# Patient Record
Sex: Female | Born: 1965
Health system: Southern US, Community
[De-identification: ages and names within clinical notes are randomized; demographics above are authoritative.]

## PROBLEM LIST (undated history)

## (undated) DIAGNOSIS — E785 Hyperlipidemia, unspecified: Secondary | ICD-10-CM

## (undated) DIAGNOSIS — M1990 Unspecified osteoarthritis, unspecified site: Secondary | ICD-10-CM

## (undated) DIAGNOSIS — E119 Type 2 diabetes mellitus without complications: Secondary | ICD-10-CM

## (undated) DIAGNOSIS — I10 Essential (primary) hypertension: Secondary | ICD-10-CM

## (undated) DIAGNOSIS — G5600 Carpal tunnel syndrome, unspecified upper limb: Secondary | ICD-10-CM

## (undated) HISTORY — DX: Carpal tunnel syndrome, unspecified upper limb: G56.00

## (undated) HISTORY — DX: Unspecified osteoarthritis, unspecified site: M19.90

## (undated) HISTORY — PX: TUBAL LIGATION: SHX77

## (undated) HISTORY — DX: Type 2 diabetes mellitus without complications: E11.9

## (undated) HISTORY — DX: Hyperlipidemia, unspecified: E78.5

## (undated) HISTORY — PX: CHOLECYSTECTOMY: SHX55

## (undated) HISTORY — DX: Essential (primary) hypertension: I10

---

## 2001-02-11 ENCOUNTER — Emergency Department (HOSPITAL_COMMUNITY): Admission: EM | Admit: 2001-02-11 | Discharge: 2001-02-11 | Payer: Self-pay | Admitting: Emergency Medicine

## 2005-02-01 ENCOUNTER — Ambulatory Visit: Payer: Self-pay | Admitting: Family Medicine

## 2005-02-02 ENCOUNTER — Ambulatory Visit (HOSPITAL_COMMUNITY): Admission: RE | Admit: 2005-02-02 | Discharge: 2005-02-02 | Payer: Self-pay | Admitting: Family Medicine

## 2005-04-20 ENCOUNTER — Ambulatory Visit: Payer: Self-pay | Admitting: Family Medicine

## 2005-05-29 ENCOUNTER — Ambulatory Visit (HOSPITAL_COMMUNITY): Admission: RE | Admit: 2005-05-29 | Discharge: 2005-05-29 | Payer: Self-pay | Admitting: Family Medicine

## 2006-01-29 ENCOUNTER — Ambulatory Visit: Payer: Self-pay | Admitting: Family Medicine

## 2006-02-08 ENCOUNTER — Ambulatory Visit (HOSPITAL_COMMUNITY): Admission: RE | Admit: 2006-02-08 | Discharge: 2006-02-08 | Payer: Self-pay | Admitting: Family Medicine

## 2006-04-05 ENCOUNTER — Ambulatory Visit: Payer: Self-pay | Admitting: Family Medicine

## 2006-05-03 ENCOUNTER — Encounter (INDEPENDENT_AMBULATORY_CARE_PROVIDER_SITE_OTHER): Payer: Self-pay | Admitting: *Deleted

## 2006-05-03 ENCOUNTER — Other Ambulatory Visit: Admission: RE | Admit: 2006-05-03 | Discharge: 2006-05-03 | Payer: Self-pay | Admitting: Family Medicine

## 2006-05-03 ENCOUNTER — Ambulatory Visit: Payer: Self-pay | Admitting: Family Medicine

## 2006-07-27 ENCOUNTER — Ambulatory Visit: Payer: Self-pay | Admitting: Family Medicine

## 2006-11-29 ENCOUNTER — Ambulatory Visit: Payer: Self-pay | Admitting: Family Medicine

## 2006-12-07 ENCOUNTER — Ambulatory Visit (HOSPITAL_COMMUNITY): Admission: RE | Admit: 2006-12-07 | Discharge: 2006-12-07 | Payer: Self-pay | Admitting: Family Medicine

## 2007-02-04 ENCOUNTER — Ambulatory Visit: Payer: Self-pay | Admitting: Family Medicine

## 2007-02-04 LAB — CONVERTED CEMR LAB
BUN: 13 mg/dL (ref 6–23)
CO2: 24 meq/L (ref 19–32)
Glucose, Bld: 82 mg/dL (ref 70–99)
Potassium: 3.5 meq/L (ref 3.5–5.3)
Sodium: 140 meq/L (ref 135–145)

## 2007-02-05 ENCOUNTER — Encounter: Payer: Self-pay | Admitting: Family Medicine

## 2007-05-02 ENCOUNTER — Ambulatory Visit: Payer: Self-pay | Admitting: Family Medicine

## 2007-05-06 ENCOUNTER — Encounter (INDEPENDENT_AMBULATORY_CARE_PROVIDER_SITE_OTHER): Payer: Self-pay | Admitting: *Deleted

## 2007-05-06 ENCOUNTER — Encounter: Payer: Self-pay | Admitting: Family Medicine

## 2007-05-06 ENCOUNTER — Ambulatory Visit: Payer: Self-pay | Admitting: Family Medicine

## 2007-05-06 ENCOUNTER — Other Ambulatory Visit: Admission: RE | Admit: 2007-05-06 | Discharge: 2007-05-06 | Payer: Self-pay | Admitting: Family Medicine

## 2007-05-06 LAB — CONVERTED CEMR LAB
Basophils Absolute: 0 10*3/uL (ref 0.0–0.1)
CO2: 26 meq/L (ref 19–32)
Calcium: 9.4 mg/dL (ref 8.4–10.5)
Creatinine, Ser: 1.18 mg/dL (ref 0.40–1.20)
Glucose, Bld: 87 mg/dL (ref 70–99)
LDL Cholesterol: 111 mg/dL — ABNORMAL HIGH (ref 0–99)
Lymphs Abs: 3.9 10*3/uL — ABNORMAL HIGH (ref 0.7–3.3)
Monocytes Relative: 6 % (ref 3–11)
Neutro Abs: 5.2 10*3/uL (ref 1.7–7.7)
Neutrophils Relative %: 53 % (ref 43–77)
RDW: 13.8 % (ref 11.5–14.0)
TSH: 1.87 microintl units/mL (ref 0.350–5.50)
Total CHOL/HDL Ratio: 4.5
WBC: 9.9 10*3/uL (ref 4.0–10.5)

## 2007-05-07 ENCOUNTER — Encounter: Payer: Self-pay | Admitting: Family Medicine

## 2007-05-07 LAB — CONVERTED CEMR LAB
Bilirubin Urine: NEGATIVE
Nitrite: NEGATIVE
Protein, ur: NEGATIVE mg/dL
Specific Gravity, Urine: 1.017 (ref 1.005–1.03)
Urobilinogen, UA: 0.2 (ref 0.0–1.0)

## 2007-09-10 ENCOUNTER — Encounter (INDEPENDENT_AMBULATORY_CARE_PROVIDER_SITE_OTHER): Payer: Self-pay | Admitting: *Deleted

## 2007-09-10 ENCOUNTER — Emergency Department (HOSPITAL_COMMUNITY): Admission: EM | Admit: 2007-09-10 | Discharge: 2007-09-10 | Payer: Self-pay | Admitting: Emergency Medicine

## 2007-10-08 ENCOUNTER — Ambulatory Visit: Payer: Self-pay | Admitting: Family Medicine

## 2008-02-04 DIAGNOSIS — Z789 Other specified health status: Secondary | ICD-10-CM

## 2008-02-04 DIAGNOSIS — E669 Obesity, unspecified: Secondary | ICD-10-CM | POA: Insufficient documentation

## 2008-02-07 ENCOUNTER — Ambulatory Visit: Payer: Self-pay | Admitting: Family Medicine

## 2008-03-20 ENCOUNTER — Ambulatory Visit: Payer: Self-pay | Admitting: Family Medicine

## 2008-05-28 ENCOUNTER — Ambulatory Visit (HOSPITAL_COMMUNITY): Admission: RE | Admit: 2008-05-28 | Discharge: 2008-05-28 | Payer: Self-pay | Admitting: Family Medicine

## 2008-07-14 ENCOUNTER — Encounter: Payer: Self-pay | Admitting: Family Medicine

## 2008-07-14 ENCOUNTER — Other Ambulatory Visit: Admission: RE | Admit: 2008-07-14 | Discharge: 2008-07-14 | Payer: Self-pay | Admitting: Family Medicine

## 2008-07-14 ENCOUNTER — Ambulatory Visit: Payer: Self-pay | Admitting: Family Medicine

## 2008-07-15 ENCOUNTER — Encounter: Payer: Self-pay | Admitting: Family Medicine

## 2008-07-16 ENCOUNTER — Encounter: Payer: Self-pay | Admitting: Family Medicine

## 2008-07-16 LAB — CONVERTED CEMR LAB
AST: 13 units/L (ref 0–37)
BUN: 17 mg/dL (ref 6–23)
Bilirubin, Direct: 0.2 mg/dL (ref 0.0–0.3)
CO2: 24 meq/L (ref 19–32)
Chloride: 102 meq/L (ref 96–112)
Glucose, Bld: 89 mg/dL (ref 70–99)
HDL: 42 mg/dL (ref >39)
Potassium: 3.9 meq/L (ref 3.5–5.3)
Sodium: 140 meq/L (ref 135–145)
Total Bilirubin: 0.6 mg/dL (ref 0.3–1.2)
Total CHOL/HDL Ratio: 4.6
Total Protein: 7.7 g/dL (ref 6.0–8.3)
VLDL: 44 mg/dL — ABNORMAL HIGH (ref 0–40)

## 2008-10-05 ENCOUNTER — Ambulatory Visit: Payer: Self-pay | Admitting: Family Medicine

## 2008-10-05 DIAGNOSIS — R51 Headache: Secondary | ICD-10-CM | POA: Insufficient documentation

## 2008-10-05 DIAGNOSIS — R519 Headache, unspecified: Secondary | ICD-10-CM | POA: Insufficient documentation

## 2008-10-05 LAB — CONVERTED CEMR LAB
Basophils Absolute: 0 10*3/uL (ref 0.0–0.1)
Eosinophils Absolute: 0.1 10*3/uL (ref 0.0–0.7)
HCT: 38.4 % (ref 36.0–46.0)
Hemoglobin: 12.7 g/dL (ref 12.0–15.0)
MCV: 86.8 fL (ref 78.0–100.0)
Monocytes Absolute: 0.9 10*3/uL (ref 0.1–1.0)
Neutro Abs: 7.4 10*3/uL (ref 1.7–7.7)
RBC: 4.43 M/uL (ref 3.87–5.11)
RDW: 13.8 % (ref 11.5–15.5)

## 2009-03-17 ENCOUNTER — Ambulatory Visit: Payer: Self-pay | Admitting: Family Medicine

## 2009-03-17 DIAGNOSIS — M25559 Pain in unspecified hip: Secondary | ICD-10-CM

## 2009-03-22 ENCOUNTER — Ambulatory Visit (HOSPITAL_COMMUNITY): Admission: RE | Admit: 2009-03-22 | Discharge: 2009-03-22 | Payer: Self-pay | Admitting: Family Medicine

## 2009-03-22 ENCOUNTER — Encounter: Payer: Self-pay | Admitting: Orthopedic Surgery

## 2009-03-23 ENCOUNTER — Encounter: Payer: Self-pay | Admitting: Family Medicine

## 2009-03-29 DIAGNOSIS — M21959 Unspecified acquired deformity of unspecified thigh: Secondary | ICD-10-CM | POA: Insufficient documentation

## 2009-04-14 ENCOUNTER — Encounter: Payer: Self-pay | Admitting: Orthopedic Surgery

## 2009-04-14 ENCOUNTER — Ambulatory Visit (HOSPITAL_COMMUNITY): Admission: RE | Admit: 2009-04-14 | Discharge: 2009-04-14 | Payer: Self-pay | Admitting: Family Medicine

## 2009-04-15 ENCOUNTER — Encounter: Payer: Self-pay | Admitting: Orthopedic Surgery

## 2009-04-19 ENCOUNTER — Ambulatory Visit: Payer: Self-pay | Admitting: Orthopedic Surgery

## 2009-04-19 DIAGNOSIS — M545 Low back pain: Secondary | ICD-10-CM | POA: Insufficient documentation

## 2009-04-21 ENCOUNTER — Encounter (HOSPITAL_COMMUNITY): Admission: RE | Admit: 2009-04-21 | Discharge: 2009-05-21 | Payer: Self-pay | Admitting: Orthopedic Surgery

## 2009-04-21 ENCOUNTER — Encounter: Payer: Self-pay | Admitting: Orthopedic Surgery

## 2009-04-23 ENCOUNTER — Telehealth: Payer: Self-pay | Admitting: Orthopedic Surgery

## 2009-04-23 ENCOUNTER — Telehealth: Payer: Self-pay | Admitting: Family Medicine

## 2009-04-23 ENCOUNTER — Ambulatory Visit: Payer: Self-pay | Admitting: Family Medicine

## 2009-04-28 ENCOUNTER — Ambulatory Visit (HOSPITAL_COMMUNITY): Admission: RE | Admit: 2009-04-28 | Discharge: 2009-04-28 | Payer: Self-pay | Admitting: Family Medicine

## 2009-05-07 ENCOUNTER — Ambulatory Visit: Payer: Self-pay | Admitting: Family Medicine

## 2009-05-14 ENCOUNTER — Encounter: Payer: Self-pay | Admitting: Family Medicine

## 2009-05-24 ENCOUNTER — Telehealth: Payer: Self-pay | Admitting: Family Medicine

## 2009-05-25 ENCOUNTER — Encounter (INDEPENDENT_AMBULATORY_CARE_PROVIDER_SITE_OTHER): Payer: Self-pay

## 2009-05-25 ENCOUNTER — Ambulatory Visit: Payer: Self-pay | Admitting: Family Medicine

## 2009-06-01 ENCOUNTER — Encounter: Admission: RE | Admit: 2009-06-01 | Discharge: 2009-06-01 | Payer: Self-pay | Admitting: Family Medicine

## 2009-06-01 ENCOUNTER — Encounter: Payer: Self-pay | Admitting: Family Medicine

## 2009-07-01 ENCOUNTER — Telehealth: Payer: Self-pay | Admitting: Family Medicine

## 2009-07-06 ENCOUNTER — Ambulatory Visit: Payer: Self-pay | Admitting: Family Medicine

## 2009-07-06 DIAGNOSIS — R5383 Other fatigue: Secondary | ICD-10-CM

## 2009-07-06 DIAGNOSIS — R5381 Other malaise: Secondary | ICD-10-CM | POA: Insufficient documentation

## 2009-07-07 ENCOUNTER — Encounter: Payer: Self-pay | Admitting: Family Medicine

## 2009-07-07 LAB — CONVERTED CEMR LAB
BUN: 23 mg/dL (ref 6–23)
CO2: 25 meq/L (ref 19–32)
Calcium: 9.9 mg/dL (ref 8.4–10.5)
Cholesterol: 223 mg/dL — ABNORMAL HIGH (ref 0–200)
Eosinophils Absolute: 0.1 10*3/uL (ref 0.0–0.7)
Eosinophils Relative: 1 % (ref 0–5)
Glucose, Bld: 94 mg/dL (ref 70–99)
LDL Cholesterol: 139 mg/dL — ABNORMAL HIGH (ref 0–99)
MCHC: 33.3 g/dL (ref 30.0–36.0)
MCV: 87.8 fL (ref 78.0–100.0)
Monocytes Absolute: 0.9 10*3/uL (ref 0.1–1.0)
Monocytes Relative: 8 % (ref 3–12)
Neutrophils Relative %: 52 % (ref 43–77)
RDW: 14.2 % (ref 11.5–15.5)
Triglycerides: 236 mg/dL — ABNORMAL HIGH (ref <150)
WBC: 10.6 10*3/uL — ABNORMAL HIGH (ref 4.0–10.5)

## 2009-07-08 ENCOUNTER — Encounter: Payer: Self-pay | Admitting: Family Medicine

## 2009-07-16 ENCOUNTER — Encounter: Payer: Self-pay | Admitting: Family Medicine

## 2009-07-16 ENCOUNTER — Encounter: Payer: Self-pay | Admitting: Orthopedic Surgery

## 2009-07-21 ENCOUNTER — Encounter: Payer: Self-pay | Admitting: Orthopedic Surgery

## 2009-09-06 ENCOUNTER — Telehealth: Payer: Self-pay | Admitting: Family Medicine

## 2009-09-22 ENCOUNTER — Ambulatory Visit: Payer: Self-pay | Admitting: Family Medicine

## 2009-09-22 ENCOUNTER — Other Ambulatory Visit: Admission: RE | Admit: 2009-09-22 | Discharge: 2009-09-22 | Payer: Self-pay | Admitting: Family Medicine

## 2009-09-30 ENCOUNTER — Encounter: Payer: Self-pay | Admitting: Family Medicine

## 2009-10-13 ENCOUNTER — Encounter: Payer: Self-pay | Admitting: Orthopedic Surgery

## 2009-11-12 ENCOUNTER — Encounter: Payer: Self-pay | Admitting: Family Medicine

## 2009-11-15 ENCOUNTER — Ambulatory Visit: Payer: Self-pay | Admitting: Family Medicine

## 2009-11-16 LAB — CONVERTED CEMR LAB
Chloride: 102 meq/L (ref 96–112)
Sodium: 139 meq/L (ref 135–145)

## 2009-11-17 ENCOUNTER — Encounter: Payer: Self-pay | Admitting: Family Medicine

## 2009-11-18 ENCOUNTER — Ambulatory Visit: Payer: Self-pay | Admitting: Family Medicine

## 2009-12-21 ENCOUNTER — Telehealth: Payer: Self-pay | Admitting: Family Medicine

## 2010-01-28 ENCOUNTER — Encounter: Payer: Self-pay | Admitting: Family Medicine

## 2010-02-17 IMAGING — CR DG HIP COMPLETE 2+V*R*
3 series · 3 of 3 positions shown · non-contrast
Comparison: 03/22/2009

CLINICAL DATA: Nonspecified acquired deformity of the right hip.

RIGHT HIP - COMPLETE 2+ VIEW

[view not recorded (1 of 3)]
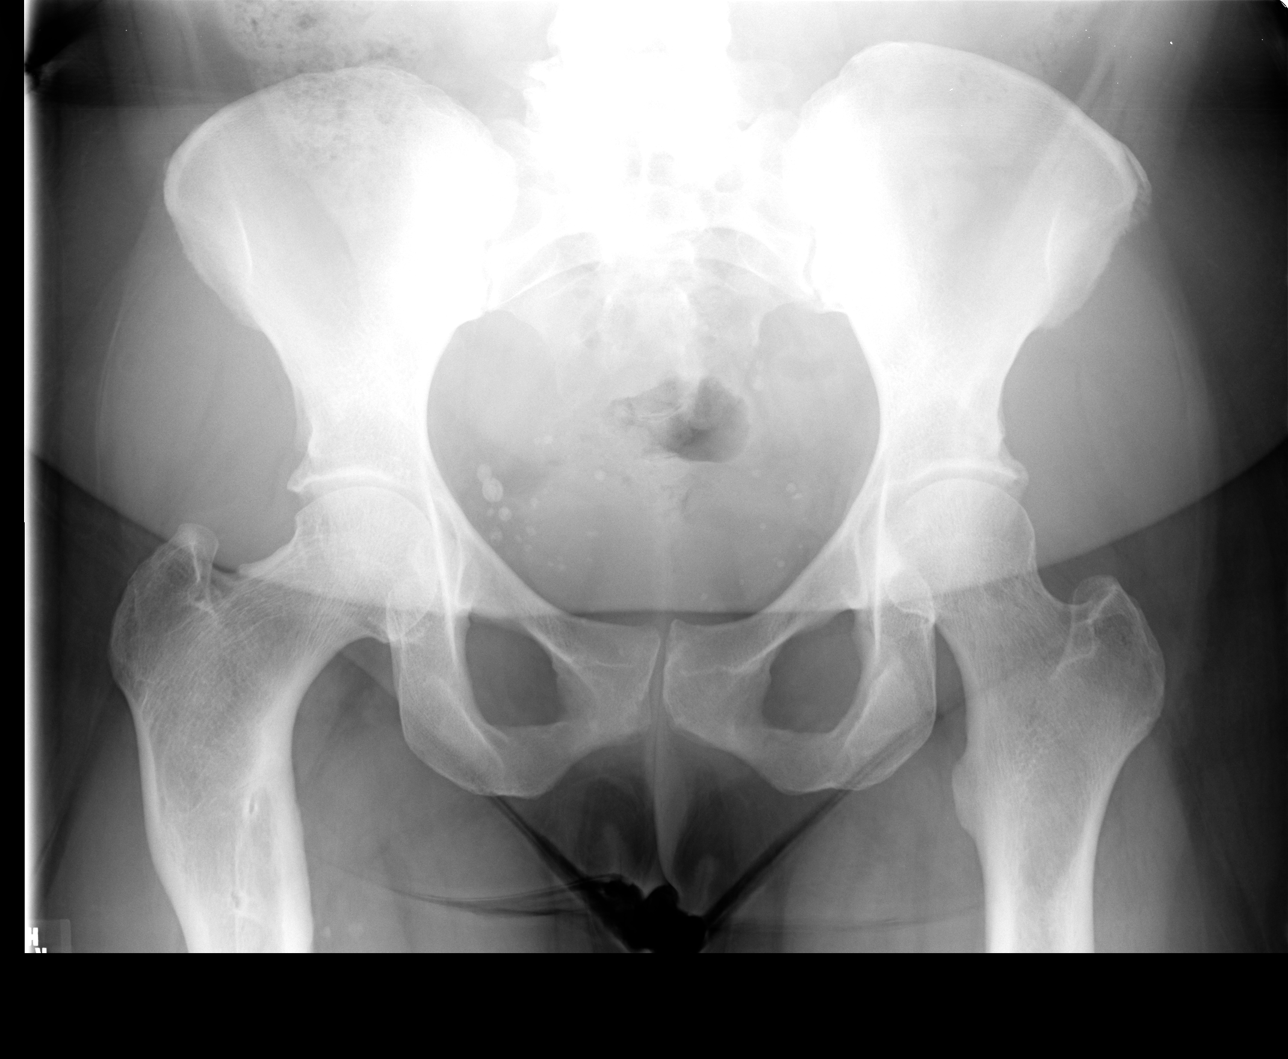

[view not recorded (2 of 3)]
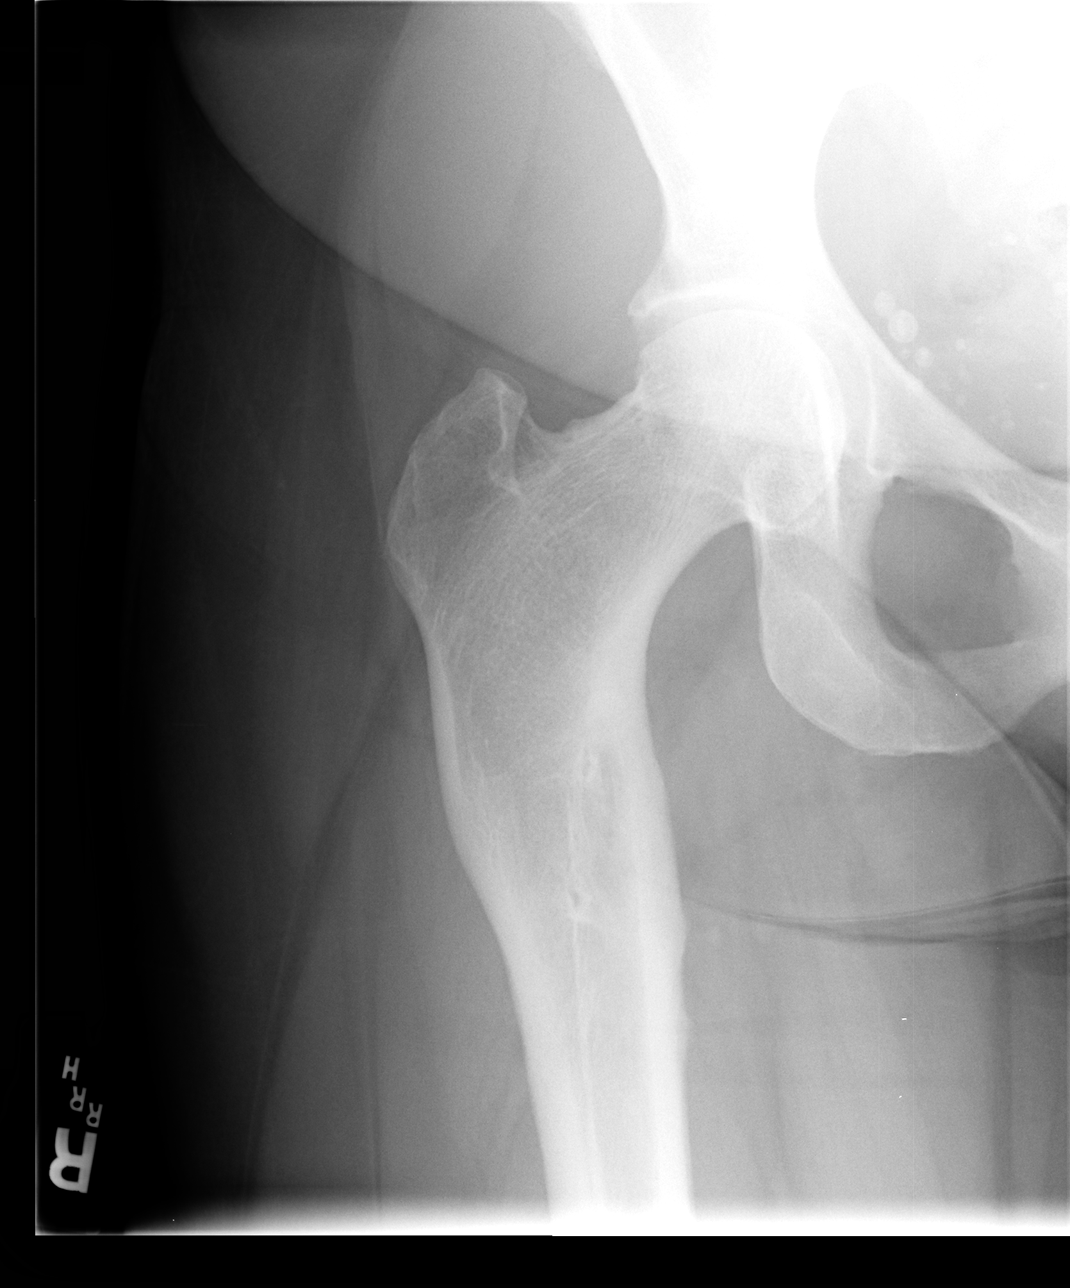

[view not recorded (3 of 3)]
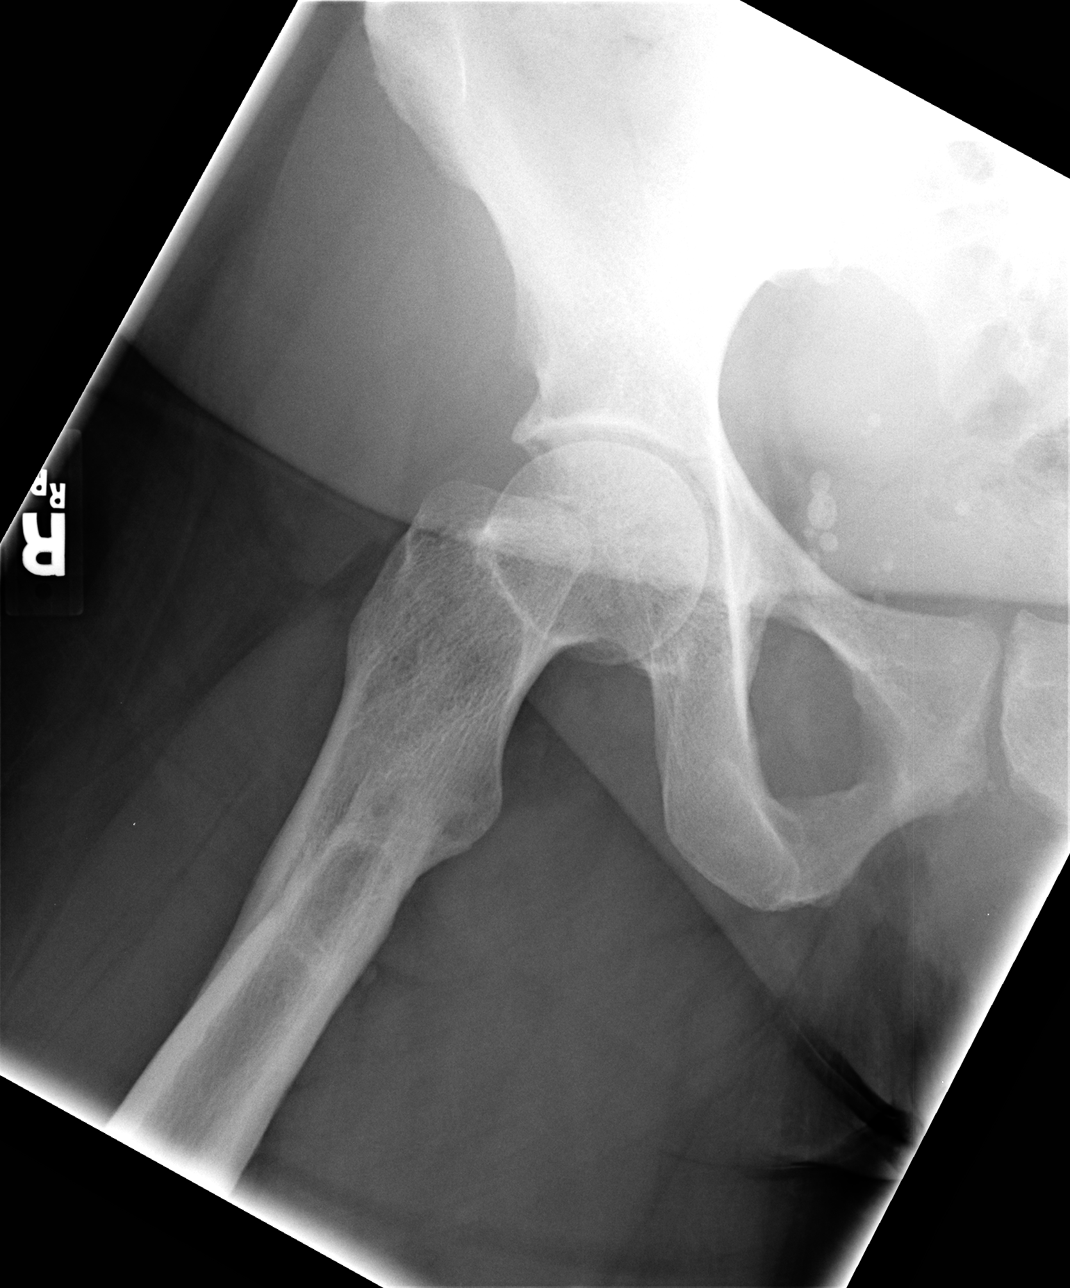

[3 of 3 positions shown; findings below may reference images not displayed]

FINDINGS: Nonspecific deformity of the proximal right femur
primarily the proximal diaphysis with some cortical thickening and
distortion of the trabecular pattern noted. Accentuation of stress
trabeculae of the right femoral neck and intertrochanteric region.
Findings are likely due to remote trauma. Joint spaces are
preserved.  SI joints unremarkable.
IMPRESSION: Deformity of the proximal right femur is most likely due to remote
trauma i.e. a healed fracture.

## 2010-03-15 ENCOUNTER — Ambulatory Visit: Payer: Self-pay | Admitting: Family Medicine

## 2010-03-17 ENCOUNTER — Telehealth: Payer: Self-pay | Admitting: Family Medicine

## 2010-04-07 ENCOUNTER — Telehealth: Payer: Self-pay | Admitting: Family Medicine

## 2010-04-12 ENCOUNTER — Telehealth: Payer: Self-pay | Admitting: Physician Assistant

## 2010-05-02 ENCOUNTER — Encounter: Payer: Self-pay | Admitting: Family Medicine

## 2010-05-04 ENCOUNTER — Encounter
Admission: RE | Admit: 2010-05-04 | Discharge: 2010-05-09 | Payer: Self-pay | Admitting: Physical Medicine & Rehabilitation

## 2010-05-09 ENCOUNTER — Ambulatory Visit: Payer: Self-pay | Admitting: Physical Medicine & Rehabilitation

## 2010-06-14 ENCOUNTER — Telehealth: Payer: Self-pay | Admitting: Family Medicine

## 2010-07-19 ENCOUNTER — Ambulatory Visit: Payer: Self-pay | Admitting: Family Medicine

## 2010-07-19 ENCOUNTER — Telehealth: Payer: Self-pay | Admitting: Family Medicine

## 2010-07-21 LAB — CONVERTED CEMR LAB
BUN: 17 mg/dL (ref 6–23)
CO2: 25 meq/L (ref 19–32)
Chloride: 100 meq/L (ref 96–112)
Creatinine, Ser: 1.4 mg/dL — ABNORMAL HIGH (ref 0.40–1.20)
Glucose, Bld: 80 mg/dL (ref 70–99)
HCT: 34.7 % — ABNORMAL LOW (ref 36.0–46.0)
Lymphs Abs: 5.1 10*3/uL — ABNORMAL HIGH (ref 0.7–4.0)
MCV: 86.1 fL (ref 78.0–100.0)
Monocytes Relative: 5 % (ref 3–12)
Neutro Abs: 6.4 10*3/uL (ref 1.7–7.7)
Neutrophils Relative %: 52 % (ref 43–77)
Platelets: 438 10*3/uL — ABNORMAL HIGH (ref 150–400)
Potassium: 3.7 meq/L (ref 3.5–5.3)
RBC: 4.03 M/uL (ref 3.87–5.11)
Sodium: 138 meq/L (ref 135–145)
TSH: 2.517 microintl units/mL (ref 0.350–4.500)
Triglycerides: 196 mg/dL — ABNORMAL HIGH (ref <150)
VLDL: 39 mg/dL (ref 0–40)
Vit D, 25-Hydroxy: 26 ng/mL — ABNORMAL LOW (ref 30–89)

## 2010-07-28 ENCOUNTER — Telehealth: Payer: Self-pay | Admitting: Family Medicine

## 2010-07-28 ENCOUNTER — Ambulatory Visit: Payer: Self-pay | Admitting: Family Medicine

## 2010-07-29 ENCOUNTER — Encounter: Payer: Self-pay | Admitting: Family Medicine

## 2010-08-02 ENCOUNTER — Ambulatory Visit (HOSPITAL_COMMUNITY): Admission: RE | Admit: 2010-08-02 | Discharge: 2010-08-02 | Payer: Self-pay | Admitting: Family Medicine

## 2010-08-11 ENCOUNTER — Encounter: Payer: Self-pay | Admitting: Family Medicine

## 2010-08-17 ENCOUNTER — Telehealth: Payer: Self-pay | Admitting: Family Medicine

## 2010-08-17 ENCOUNTER — Ambulatory Visit (HOSPITAL_COMMUNITY): Admission: RE | Admit: 2010-08-17 | Discharge: 2010-08-17 | Payer: Self-pay | Admitting: Family Medicine

## 2010-08-24 ENCOUNTER — Encounter: Payer: Self-pay | Admitting: Family Medicine

## 2010-08-24 ENCOUNTER — Ambulatory Visit (HOSPITAL_COMMUNITY): Admission: RE | Admit: 2010-08-24 | Discharge: 2010-08-24 | Payer: Self-pay | Admitting: Family Medicine

## 2010-10-22 ENCOUNTER — Encounter: Payer: Self-pay | Admitting: Family Medicine

## 2010-10-27 ENCOUNTER — Other Ambulatory Visit: Payer: Self-pay | Admitting: Family Medicine

## 2010-10-27 ENCOUNTER — Other Ambulatory Visit (HOSPITAL_COMMUNITY)
Admission: RE | Admit: 2010-10-27 | Discharge: 2010-10-27 | Disposition: A | Discharge status: Home / Self Care | Payer: PRIVATE HEALTH INSURANCE | Source: Ambulatory Visit | Attending: Family Medicine | Admitting: Family Medicine

## 2010-10-27 ENCOUNTER — Ambulatory Visit
Admission: RE | Admit: 2010-10-27 | Discharge: 2010-10-27 | Payer: Self-pay | Source: Home / Self Care | Attending: Family Medicine | Admitting: Family Medicine

## 2010-10-27 DIAGNOSIS — Z01419 Encounter for gynecological examination (general) (routine) without abnormal findings: Secondary | ICD-10-CM | POA: Insufficient documentation

## 2010-10-27 DIAGNOSIS — R1900 Intra-abdominal and pelvic swelling, mass and lump, unspecified site: Secondary | ICD-10-CM | POA: Insufficient documentation

## 2010-10-27 LAB — CONVERTED CEMR LAB: OCCULT 1: NEGATIVE

## 2010-10-28 ENCOUNTER — Telehealth (INDEPENDENT_AMBULATORY_CARE_PROVIDER_SITE_OTHER): Payer: Self-pay | Admitting: *Deleted

## 2010-10-28 ENCOUNTER — Other Ambulatory Visit: Payer: Self-pay | Admitting: Family Medicine

## 2010-10-28 DIAGNOSIS — R1900 Intra-abdominal and pelvic swelling, mass and lump, unspecified site: Secondary | ICD-10-CM

## 2010-10-30 LAB — CONVERTED CEMR LAB
BUN: 16 mg/dL (ref 6–23)
Basophils Relative: 1 % (ref 0–1)
CO2: 23 meq/L (ref 19–32)
Calcium: 9.6 mg/dL (ref 8.4–10.5)
Chloride: 101 meq/L (ref 96–112)
Creatinine, Ser: 1.57 mg/dL — ABNORMAL HIGH (ref 0.40–1.20)
Glucose, Bld: 87 mg/dL (ref 70–99)
Hemoglobin: 10.3 g/dL — ABNORMAL LOW (ref 12.0–15.0)
Lymphocytes Relative: 31 % (ref 12–46)
Lymphs Abs: 3.9 10*3/uL (ref 0.7–4.0)
MCHC: 32.8 g/dL (ref 30.0–36.0)
MCV: 88.5 fL (ref 78.0–100.0)
Monocytes Absolute: 0.7 10*3/uL (ref 0.1–1.0)
Monocytes Relative: 6 % (ref 3–12)
Platelets: 431 10*3/uL — ABNORMAL HIGH (ref 150–400)

## 2010-11-01 ENCOUNTER — Encounter: Payer: Self-pay | Admitting: Family Medicine

## 2010-11-01 NOTE — Letter (Signed)
Summary: medical release  medical release   Imported By: Lind Guest 11/12/2009 13:53:18  _____________________________________________________________________  External Attachment:    Type:   Image     Comment:   External Document

## 2010-11-01 NOTE — Letter (Signed)
Summary: Medical record request Parameds  Medical record request Parameds   Imported By: Cammie Sickle 01/22/2010 12:18:37  _____________________________________________________________________  External Attachment:    Type:   Image     Comment:   External Document

## 2010-11-01 NOTE — Progress Notes (Signed)
  Phone Note From Pharmacy   Caller: [address] [place]. [phone]* Summary of Call: requesting refill on hydrocodone 7.5/750 Initial call taken by: Adella Hare LPN,  April 08, 9603 9:42 AM  Follow-up for Phone Call        ok to refill x 3 Follow-up by: Syliva Overman MD,  April 07, 2010 12:07 PM  Additional Follow-up for Phone Call Additional follow up Details #1::        Prescription resent Additional Follow-up by: Adella Hare LPN,  April 07, 5408 4:49 PM    Prescriptions: VICODIN ES 7.5-750 MG TABS (HYDROCODONE-ACETAMINOPHEN) Take 1 tab by mouth at bedtime  #30 x 2   Entered by:   Adella Hare LPN   Authorized by:   Syliva Overman MD   Signed by:   Adella Hare LPN on 81/19/1478   Method used:   Printed then faxed to ...       [address] [phone]* (retail)       [address]       [place], [place]  [address]       Ph: [phone] or [phone]       Fax: [phone]   RxID:   [id number]

## 2010-11-01 NOTE — Progress Notes (Signed)
Summary: speak with nurse  Phone Note Call from Patient   Reason for Call: Refill Medication Summary of Call: pt needs her refill on diet med. called into [facility] [phone] Initial call taken by: Rudene Anda,  June 14, 2010 4:01 PM  Follow-up for Phone Call        needs appt Follow-up by: Adella Hare LPN,  June 14, 2010 4:37 PM  Additional Follow-up for Phone Call Additional follow up Details #1::        pt states she has appt on 07/19/2010 to see dr.simpson. Additional Follow-up by: Rudene Anda,  June 15, 2010 8:47 AM    Additional Follow-up for Phone Call Additional follow up Details #2::    patient aware no refill until ov Follow-up by: Adella Hare LPN,  June 15, 2010 8:53 AM

## 2010-11-01 NOTE — Letter (Signed)
Summary: ING PAPERS  ING PAPERS   Imported By: Lind Guest 07/29/2010 09:52:56  _____________________________________________________________________  External Attachment:    Type:   Image     Comment:   External Document

## 2010-11-01 NOTE — Progress Notes (Signed)
Summary: order  Phone Note Call from Patient   Summary of Call: needs a order for a breast biopsy left breast at [facility] Initial call taken by: Lind Guest,  August 17, 2010 12:59 PM  Follow-up for Phone Call        patient already has appt Follow-up by: Adella Hare LPN,  August 17, 2010 1:34 PM

## 2010-11-01 NOTE — Progress Notes (Signed)
Summary: Z PAK  Phone Note Call from Patient   Summary of Call: [facility] DID NOT RECIEVE HER  Z PAK SHE WANTS TO KNOW CAN YOU SEND IT OVER Initial call taken by: Lind Guest,  March 17, 2010 1:58 PM  Follow-up for Phone Call        advised was filled at pharmacy Follow-up by: Adella Hare LPN,  March 17, 2010 3:15 PM

## 2010-11-01 NOTE — Letter (Signed)
Summary: BREAST CENTER  BREAST CENTER   Imported By: Lind Guest 08/11/2010 09:08:10  _____________________________________________________________________  External Attachment:    Type:   Image     Comment:   External Document

## 2010-11-01 NOTE — Assessment & Plan Note (Signed)
Summary: office visit   Vital Signs:  Patient profile:   45 year old female Menstrual status:  regular Height:      63 inches Weight:      203 pounds BMI:     36.09 O2 Sat:      98 % Pulse rate:   86 / minute Pulse rhythm:   regular Resp:     16 per minute BP sitting:   100 / 72 Cuff size:   large  Vitals Entered By: Everitt Amber (November 15, 2009 10:23 AM)  Nutrition Counseling: Patient's BMI is greater than 25 and therefore counseled on weight management options. CC: throat sore, hoarsness, tension in head, clearish drainage in nose, been going on since thursday   CC:  throat sore, hoarsness, tension in head, clearish drainage in nose, and been going on since thursday.  History of Present Illness: Reports  that she had been doing fairly well up until 1 week ago  Denies chest congestion, or cough productive of sputum. Denies chest pain, palpitations, PND, orthopnea or leg swelling. Denies abdominal pain, nausea, vomitting, diarrhea or constipation. Denies change in bowel movements or bloody stool. Denies dysuria , frequency, incontinence or hesitancy.  Denies headaches, vertigo, seizures. Denies depression, anxiety or insomnia. Denies  rash, lesions, or itch. she reports continuing backache limiting her ability to walk, stand , sit  for prolonged periods.     Allergies: No Known Drug Allergies  Review of Systems      See HPI General:  Complains of chills, fatigue, fever, and malaise. ENT:  Complains of hoarseness, nasal congestion, postnasal drainage, and sinus pressure; 1 week h/o left maxillary sinus pressure with hoarseness, chills and fever. Resp:  Complains of cough; denies shortness of breath, sputum productive, and wheezing. MS:  Complains of low back pain and mid back pain; uncontrolled LBP to left lower ext, wants a referral to pain center. Endo:  Complains of cold intolerance; denies excessive thirst and excessive urination. Heme:  Denies abnormal  bruising and bleeding. Allergy:  Complains of seasonal allergies; denies hives or rash and sneezing.  Physical Exam  General:  Well-developed,well-nourished,in no acute distress; alert,appropriate and cooperative throughout examination HEENT: No facial asymmetry,  EOMI, left maxillary  sinus tenderness, TM's Clear, oropharynx  pink and moist.   Chest: Clear to auscultation bilaterally.  CVS: S1, S2, No murmurs, No S3.   Abd: Soft, Nontender.  ZO:XWRUEAVWU ROM spine,adequate in  hips, shoulders and knees.  Ext: No edema.   CNS: CN 2-12 intact, power tone and sensation normal throughout.   Skin: Intact, no visible lesions or rashes.  Psych: Good eye contact, normal affect.  Memory intact, not anxious or depressed appearing.    Impression & Recommendations:  Problem # 1:  ACUTE MAXILLARY SINUSITIS (ICD-461.0) Assessment Comment Only  Her updated medication list for this problem includes:    Penicillin V Potassium 500 Mg Tabs (Penicillin v potassium) .Marland Kitchen... Take 1 tablet by mouth three times a day    Tessalon Perles 100 Mg Caps (Benzonatate) .Marland Kitchen... Take 1 capsule by mouth three times a day  Problem # 2:  LOW BACK PAIN (ICD-724.2) Assessment: Unchanged  Her updated medication list for this problem includes:    Robaxin 500 Mg Tabs (Methocarbamol) .Marland Kitchen... 1 by mouth q 6 as needed locking  catching or pain    Tylenol Extra Strength 500 Mg Tabs (Acetaminophen) .Marland Kitchen... 1 tablet twice daily as needed    Vicodin Es 7.5-750 Mg Tabs (Hydrocodone-acetaminophen) .Marland Kitchen... Take 1  tab by mouth at bedtime  Orders: Pain Clinic Referral (Pain)  Problem # 3:  OBESITY (ICD-278.00) Assessment: Improved  Ht: 63 (11/15/2009)   Wt: 203 (11/15/2009)   BMI: 36.09 (11/15/2009)  Problem # 4:  HYPERTENSION (ICD-401.9) Assessment: Unchanged  Her updated medication list for this problem includes:    Maxzide 75-50 Mg Tabs (Triamterene-hctz) .Marland Kitchen... Take 1 tablet by mouth once a day    Lotrel 10-40 Mg Caps  (Amlodipine besy-benazepril hcl) .Marland Kitchen... Take 1 tablet by mouth once a day  Orders: T-Basic Metabolic Panel [id number])  BP today: 100/72 Prior BP: 100/70 (09/22/2009)  Labs Reviewed: K+: 3.8 (09/22/2009) Creat: : 1.57 (09/22/2009)   Chol: 223 (07/06/2009)   HDL: 37 (07/06/2009)   LDL: 139 (07/06/2009)   TG: 236 (07/06/2009)  Problem # 5:  ACUTE LARYNGITIS, WITHOUT MENTION OF OBSTRUCTIO (ICD-464.00) Assessment: Comment Only fluidss and voice rest  Complete Medication List: 1)  Zyrtec Allergy 10 Mg Tabs (Cetirizine hcl) .... One tab by mouth once daily 2)  Maxzide 75-50 Mg Tabs (Triamterene-hctz) .... Take 1 tablet by mouth once a day 3)  Phentermine Hcl 37.5 Mg Tabs (Phentermine hcl) .... Take 1 tablet by mouth once a day 4)  Robaxin 500 Mg Tabs (Methocarbamol) .Marland Kitchen.. 1 by mouth q 6 as needed locking  catching or pain 5)  Tylenol Extra Strength 500 Mg Tabs (Acetaminophen) .Marland Kitchen.. 1 tablet twice daily as needed 6)  Vicodin Es 7.5-750 Mg Tabs (Hydrocodone-acetaminophen) .... Take 1 tab by mouth at bedtime 7)  Omeprazole 20 Mg Cpdr (Omeprazole) .... Take 1 capsule by mouth two times a day 8)  Lotrel 10-40 Mg Caps (Amlodipine besy-benazepril hcl) .... Take 1 tablet by mouth once a day 9)  Penicillin V Potassium 500 Mg Tabs (Penicillin v potassium) .... Take 1 tablet by mouth three times a day 10)  Tessalon Perles 100 Mg Caps (Benzonatate) .... Take 1 capsule by mouth three times a day 11)  Fluconazole 150 Mg Tabs (Fluconazole) .... Take 1 tablet by mouth once a day as needed  Patient Instructions: 1)  Please schedule a follow-up appointment in 4 months. 2)  It is important that you exercise regularly at least 20 minutes 5 times a week. If you develop chest pain, have severe difficulty breathing, or feel very tired , stop exercising immediately and seek medical attention. 3)  You need to lose weight. Consider a lower calorie diet and regular exercise.  4)   You are being treated for acute left  maxillary sinusitis and laryngitis. 5)  Voice rest and fluids are recmmended. 6)  You will be referred to Dr Eduard Clos for pain management  7)  BMP prior to visit, ICD-9:  today not stat, dx CRI and HTN Prescriptions: FLUCONAZOLE 150 MG TABS (FLUCONAZOLE) Take 1 tablet by mouth once a day as needed  #3 x 0   Entered and Authorized by:   Syliva Overman MD   Signed by:   Syliva Overman MD on 11/15/2009   Method used:   Electronically to        [facility] [phone]* (retail)       [address]       [place], [place]  [address]       Ph: [phone] or [phone]       Fax: [phone]   RxID:   [id number] TESSALON PERLES 100 MG CAPS (BENZONATATE) Take 1 capsule by mouth three times a day  #30 x 0  Entered and Authorized by:   Syliva Overman MD   Signed by:   Syliva Overman MD on 11/15/2009   Method used:   Electronically to        [facility] [phone]* (retail)       [address]       [place], [place]  [address]       Ph: [phone] or [phone]       Fax: [phone]   RxID:   [id number] PENICILLIN V POTASSIUM 500 MG TABS (PENICILLIN V POTASSIUM) Take 1 tablet by mouth three times a day  #42 x 0   Entered and Authorized by:   Syliva Overman MD   Signed by:   Syliva Overman MD on 11/15/2009   Method used:   Electronically to        [facility] [phone]* (retail)       [address]       [place], [place]  [address]       Ph: [phone] or [phone]       Fax: [phone]   RxID:   [id number]

## 2010-11-01 NOTE — Assessment & Plan Note (Signed)
Summary: FMLA PAPERS   Allergies: No Known Drug Allergies   Complete Medication List: 1)  Zyrtec Allergy 10 Mg Tabs (Cetirizine hcl) .... One tab by mouth once daily 2)  Maxzide 75-50 Mg Tabs (Triamterene-hctz) .... Take 1 tablet by mouth once a day 3)  Phentermine Hcl 37.5 Mg Tabs (Phentermine hcl) .... Take 1 tablet by mouth once a day 4)  Robaxin 500 Mg Tabs (Methocarbamol) .Marland Kitchen.. 1 by mouth q 6 as needed locking  catching or pain 5)  Tylenol Extra Strength 500 Mg Tabs (Acetaminophen) .Marland Kitchen.. 1 tablet twice daily as needed 6)  Vicodin Es 7.5-750 Mg Tabs (Hydrocodone-acetaminophen) .... Take 1 tab by mouth at bedtime 7)  Omeprazole 20 Mg Cpdr (Omeprazole) .... Take 1 capsule by mouth two times a day 8)  Lotrel 10-40 Mg Caps (Amlodipine besy-benazepril hcl) .... Take 1 tablet by mouth once a day 9)  Penicillin V Potassium 500 Mg Tabs (Penicillin v potassium) .... Take 1 tablet by mouth three times a day 10)  Tessalon Perles 100 Mg Caps (Benzonatate) .... Take 1 capsule by mouth three times a day 11)  Fluconazole 150 Mg Tabs (Fluconazole) .... Take 1 tablet by mouth once a day as needed fmla forms completed and were collected by the pt. original scanned in and a copy of paper form kept on file

## 2010-11-01 NOTE — Letter (Signed)
Summary: medical release  medical release   Imported By: Lind Guest 01/28/2010 14:06:28  _____________________________________________________________________  External Attachment:    Type:   Image     Comment:   External Document

## 2010-11-01 NOTE — Progress Notes (Signed)
Summary: paperwork  Phone Note Call from Patient   Summary of Call: patient states she brought in paperwork this am, will need it back so she can send to the place by the 4th of next month.  Thanks Initial call taken by: Lind Guest,  July 19, 2010 10:57 AM  Follow-up for Phone Call        pls advizsecomplet, there are papers she needsto fill outalso Follow-up by: Syliva Overman MD,  July 26, 2010 7:08 PM  Additional Follow-up for Phone Call Additional follow up Details #1::        PATIENT HAS PICKED UP Additional Follow-up by: Lind Guest,  July 28, 2010 11:59 AM

## 2010-11-01 NOTE — Letter (Signed)
Summary: ING PAPERS  ING PAPERS   Imported By: Lind Guest 11/17/2009 14:28:03  _____________________________________________________________________  External Attachment:    Type:   Image     Comment:   External Document

## 2010-11-01 NOTE — Assessment & Plan Note (Signed)
Summary: office visit   Vital Signs:  Patient profile:   45 year old female Menstrual status:  regular Height:      63 inches Weight:      200.75 pounds BMI:     35.69 O2 Sat:      98 % on Room air Pulse rate:   68 / minute Pulse rhythm:   regular Resp:     16 per minute BP sitting:   110 / 50  (left arm)  Vitals Entered By: Adella Hare LPN (March 15, 2010 9:37 AM)  Nutrition Counseling: Patient's BMI is greater than 25 and therefore counseled on weight management options.  O2 Flow:  Room air CC: follow-up visit Is Patient Diabetic? No Pain Assessment Patient in pain? no        CC:  follow-up visit.  History of Present Illness: Pt reports that she is still experiencing alot of lower back pain rated at an 8 on a daily basis. Inactivity aggravates the pain she has back stiffness, however her activity remains severely limited. She reports being unable to drive, upper body motion causes pain,standing for betweemn 10 to 15 mins increases her pain, sitting for over is uncomfortable, and feels as though she is incapable of crawling , bending or squatting, motion resultsin a pulling sensation in her back on the left side of her back. Does not feel that PT will help, she had an epidural which was helpful, she needs a referral to pain clinic, and she is interested in trying to see if a chiropractrer would be helpful. She continues to watch her diet, takes phentermine regularly and tries to walk for exercise as she is able, unfortunately this is extremely limited.  Current Medications (verified): 1)  Zyrtec Allergy 10 Mg  Tabs (Cetirizine Hcl) .... One Tab By Mouth Once Daily 2)  Maxzide 75-50 Mg Tabs (Triamterene-Hctz) .... Take 1 Tablet By Mouth Once A Day 3)  Phentermine Hcl 37.5 Mg Tabs (Phentermine Hcl) .... Take 1 Tablet By Mouth Once A Day 4)  Robaxin 500 Mg Tabs (Methocarbamol) .Marland Kitchen.. 1 By Mouth Q 6 As Needed Locking  Catching or Pain 5)  Tylenol Extra Strength 500 Mg  Tabs (Acetaminophen) .Marland Kitchen.. 1 Tablet Twice Daily As Needed 6)  Vicodin Es 7.5-750 Mg Tabs (Hydrocodone-Acetaminophen) .... Take 1 Tab By Mouth At Bedtime 7)  Omeprazole 20 Mg Cpdr (Omeprazole) .... Take 1 Capsule By Mouth Two Times A Day 8)  Lotrel 10-40 Mg Caps (Amlodipine Besy-Benazepril Hcl) .... Take 1 Tablet By Mouth Once A Day  Allergies (verified): No Known Drug Allergies  Review of Systems      See HPI General:  Denies chills and loss of appetite. Eyes:  Denies blurring and discharge. ENT:  Complains of hoarseness, sinus pressure, and sore throat; 2 day h/o. CV:  Denies chest pain or discomfort, difficulty breathing while lying down, palpitations, and swelling of feet. Resp:  Denies cough and sputum productive. GI:  Denies abdominal pain, constipation, diarrhea, nausea, and vomiting. GU:  Denies dysuria and urinary frequency. MS:  Complains of low back pain, mid back pain, muscle weakness, and stiffness; continued disabling sym[ptoms. Derm:  Complains of itching and rash; eczema, currently no flare. Neuro:  Denies headaches, seizures, and sensation of room spinning. Psych:  Complains of anxiety and depression; denies easily angered, easily tearful, irritability, mental problems, sense of great danger, suicidal thoughts/plans, thoughts of violence, and unusual visions or sounds; mild symptoms due to prolonged ill health, concerns about ability  to work, nomed desired or needed at [employer]. Endo:  Denies cold intolerance, excessive hunger, excessive thirst, excessive urination, heat intolerance, polyuria, and weight change. Heme:  Denies abnormal bruising and bleeding. Allergy:  Denies hives or rash and itching eyes.  Physical Exam  General:  Well-developed,well-nourished,in no acute distress; alert,appropriate and cooperative throughout examination HEENT: No facial asymmetry,  EOMI, lmaxillary  sinus tenderness, TM's Clear, oropharynx  pink and moist. cervical adenitis  Chest: Clear  to auscultation bilaterally.  CVS: S1, S2, No murmurs, No S3.   Abd: Soft, Nontender.  ZO:XWRUEAVWU ROM spine,adequate in  hips, shoulders and knees.  Ext: No edema.   CNS: CN 2-12 intact, power tone and sensation normal throughout.   Skin: Intact, no visible lesions or rashes.  Psych: Good eye contact, normal affect.  Memory intact, not anxious or depressed appearing.    Impression & Recommendations:  Problem # 1:  ACUTE MAXILLARY SINUSITIS (ICD-461.0) Assessment Comment Only  The following medications were removed from the medication list:    Penicillin V Potassium 500 Mg Tabs (Penicillin v potassium) .Marland Kitchen... Take 1 tablet by mouth three times a day    Tessalon Perles 100 Mg Caps (Benzonatate) .Marland Kitchen... Take 1 capsule by mouth three times a day Her updated medication list for this problem includes:    Zithromax Z-pak 250 Mg Tabs (Azithromycin) ..... Use as directed  Problem # 2:  LOW BACK PAIN (ICD-724.2) Assessment: Unchanged  Her updated medication list for this problem includes:    Robaxin 500 Mg Tabs (Methocarbamol) .Marland Kitchen... 1 by mouth q 6 as needed locking  catching or pain    Tylenol Extra Strength 500 Mg Tabs (Acetaminophen) .Marland Kitchen... 1 tablet twice daily as needed    Vicodin Es 7.5-750 Mg Tabs (Hydrocodone-acetaminophen) .Marland Kitchen... Take 1 tab by mouth at bedtime  Orders: Pain Clinic Referral (Pain) Chiropractic Referral (Chiro)  Problem # 3:  OBESITY (ICD-278.00) Assessment: Improved  Ht: 63 (03/15/2010)   Wt: 200.75 (03/15/2010)   BMI: 35.69 (03/15/2010)  Problem # 4:  HYPERTENSION (ICD-401.9) Assessment: Improved  Her updated medication list for this problem includes:    Maxzide 75-50 Mg Tabs (Triamterene-hctz) .Marland Kitchen... Take 1 tablet by mouth once a day    Lotrel 10-40 Mg Caps (Amlodipine besy-benazepril hcl) .Marland Kitchen... Take 1 tablet by mouth once a day  Orders: T-Basic Metabolic Panel [id number]) T-Hepatic Function [phone])  BP today: 110/50 Prior BP: 100/72  (11/15/2009)  Labs Reviewed: K+: 4.0 (11/15/2009) Creat: : 1.45 (11/15/2009)   Chol: 223 (07/06/2009)   HDL: 37 (07/06/2009)   LDL: 139 (07/06/2009)   TG: 236 (07/06/2009)  Complete Medication List: 1)  Zyrtec Allergy 10 Mg Tabs (Cetirizine hcl) .... One tab by mouth once daily 2)  Maxzide 75-50 Mg Tabs (Triamterene-hctz) .... Take 1 tablet by mouth once a day 3)  Robaxin 500 Mg Tabs (Methocarbamol) .Marland Kitchen.. 1 by mouth q 6 as needed locking  catching or pain 4)  Tylenol Extra Strength 500 Mg Tabs (Acetaminophen) .Marland Kitchen.. 1 tablet twice daily as needed 5)  Vicodin Es 7.5-750 Mg Tabs (Hydrocodone-acetaminophen) .... Take 1 tab by mouth at bedtime 6)  Omeprazole 20 Mg Cpdr (Omeprazole) .... Take 1 capsule by mouth two times a day 7)  Lotrel 10-40 Mg Caps (Amlodipine besy-benazepril hcl) .... Take 1 tablet by mouth once a day 8)  Zithromax Z-pak 250 Mg Tabs (Azithromycin) .... Use as directed 9)  Phentermine Hcl 37.5 Mg Tabs (Phentermine hcl) .... Take 1 tablet by mouth once a day  Other Orders:  T-Lipid Profile [id number]) T-Vitamin D (25-Hydroxy) [id number])  Patient Instructions: 1)  Please schedule a follow-up appointment in 4 months. 2)  It is important that you exercise regularly at least 20 minutes 5 times a week. If you develop chest pain, have severe difficulty breathing, or feel very tired , stop exercising immediately and seek medical attention. 3)  You need to lose weight. Consider a lower calorie diet and regular exercise.  4)  BMP prior to visit, ICD-9: 5)  Hepatic Panel prior to visit, ICD-9:   fasting asap 6)  Lipid Panel prior to visit, ICD-9: 7)  Vit D 8)  you are being referred for epidural injections also chiropracter 9)  Keep hopeful . 10)  Yopu are being treated fo sinusitis Prescriptions: PHENTERMINE HCL 37.5 MG TABS (PHENTERMINE HCL) Take 1 tablet by mouth once a day  #1 x 0   Entered and Authorized by:   Syliva Overman MD   Signed by:   Syliva Overman MD on  03/15/2010   Method used:   Printed then faxed to ...       [address] [phone]* (retail)       [address]       [place], [place]  [address]       Ph: [phone] or [phone]       Fax: [phone]   RxID:   [id number] Christena Deem Z-PAK 250 MG TABS (AZITHROMYCIN) Use as directed  #1 x 0   Entered and Authorized by:   Syliva Overman MD   Signed by:   Syliva Overman MD on 03/15/2010   Method used:   Electronically to        [facility] [phone]* (retail)       [address]       [place], [place]  [address]       Ph: [phone] or [phone]       Fax: [phone]   RxID:   [id number]

## 2010-11-01 NOTE — Assessment & Plan Note (Signed)
Summary: disability papers   Allergies: No Known Drug Allergies   Complete Medication List: 1)  Zyrtec Allergy 10 Mg Tabs (Cetirizine hcl) .... One tab by mouth once daily 2)  Maxzide 75-50 Mg Tabs (Triamterene-hctz) .... Take 1 tablet by mouth once a day 3)  Robaxin 500 Mg Tabs (Methocarbamol) .Marland Kitchen.. 1 by mouth q 6 as needed locking  catching or pain 4)  Tylenol Extra Strength 500 Mg Tabs (Acetaminophen) .Marland Kitchen.. 1 tablet twice daily as needed 5)  Vicodin Es 7.5-750 Mg Tabs (Hydrocodone-acetaminophen) .... Take 1 tab by mouth at bedtime 6)  Amlodipine Besylate 10 Mg Tabs (Amlodipine besylate) .... Take 1 tablet by mouth once a day 7)  Phentermine Hcl 37.5 Mg Tabs (Phentermine hcl) .... Take 1 tablet by mouth once a day 8)  Benazepril Hcl 40 Mg Tabs (Benazepril hcl) .... Take 1 tablet by mouth once a day 9)  Multivitamins Tabs (Multiple vitamin) .... One tab by mouth once daily  Other Orders: Form Completion ([id number])   Orders Added: 1)  Form Completion [[id number]]

## 2010-11-01 NOTE — Assessment & Plan Note (Signed)
Summary: office visit   Vital Signs:  Patient profile:   45 year old female Menstrual status:  regular Height:      63 inches Weight:      206.50 pounds BMI:     36.71 O2 Sat:      97 % on Room air Pulse rate:   72 / minute Pulse rhythm:   regular Resp:     16 per minute BP sitting:   110 / 80  (left arm)  Vitals Entered By: Adella Hare LPN (July 19, 2010 9:39 AM)  Nutrition Counseling: Patient's BMI is greater than 25 and therefore counseled on weight management options.  O2 Flow:  Room air CC: follow-up visit Is Patient Diabetic? No Pain Assessment Patient in pain? no        CC:  follow-up visit.  History of Present Illness: Reports  that tshe has not been doing well. Denies recent fever or chills. Denies sinus pressure, nasal congestion , ear pain or sore throat. Denies chest congestion, or cough productive of sputum. Denies chest pain, palpitations, PND, orthopnea or leg swelling. Denies abdominal pain, nausea, vomitting, diarrhea or constipation. Denies change in bowel movements or bloody stool. Denies dysuria , frequency, incontinence or hesitancy. she has continued back pai with limited mobility, which prevents her from doing very much. Denies headaches, vertigo, seizures. Reports depresssion and anxiety over her marriage, spouse is incrasingly distant, she believes he is having an affair, though he denies it, no sexual relationship. Denies  rash, lesions, or itch.     Current Medications (verified): 1)  Zyrtec Allergy 10 Mg  Tabs (Cetirizine Hcl) .... One Tab By Mouth Once Daily 2)  Maxzide 75-50 Mg Tabs (Triamterene-Hctz) .... Take 1 Tablet By Mouth Once A Day 3)  Robaxin 500 Mg Tabs (Methocarbamol) .Marland Kitchen.. 1 By Mouth Q 6 As Needed Locking  Catching or Pain 4)  Tylenol Extra Strength 500 Mg Tabs (Acetaminophen) .Marland Kitchen.. 1 Tablet Twice Daily As Needed 5)  Vicodin Es 7.5-750 Mg Tabs (Hydrocodone-Acetaminophen) .... Take 1 Tab By Mouth At Bedtime 6)   Amlodipine Besylate 10 Mg Tabs (Amlodipine Besylate) .... Take 1 Tablet By Mouth Once A Day 7)  Phentermine Hcl 37.5 Mg Tabs (Phentermine Hcl) .... Take 1 Tablet By Mouth Once A Day 8)  Benazepril Hcl 40 Mg Tabs (Benazepril Hcl) .... Take 1 Tablet By Mouth Once A Day 9)  Multivitamins  Tabs (Multiple Vitamin) .... One Tab By Mouth Once Daily  Allergies (verified): No Known Drug Allergies  Past History:  Past medical, surgical, family and social histories (including risk factors) reviewed, and no changes noted (except as noted below). Past surgical history reviewed for relevance to current acute and chronic problems.  Past Medical History: Cholecystectomy (1995) Bilateral tubal ligation (1994) Current Problems:  OBESITY (ICD-278.00) HYPERTENSION (ICD-401.9) VIRAL INFECTION (ICD-079.99) Back pain  Past Surgical History: Reviewed history from 09/10/2007 and no changes required. Cholecystectomy (1995) Tubal ligation (1994)  Family History: Reviewed history from 04/19/2009 and no changes required. Mom HTN,Increased lipid Dad HTN,MI Sisters 2 Healthy Brothers  2 healthy FH of Cancer:  Family History of Diabetes Family History of Arthritis  Social History: Reviewed history from 04/19/2009 and no changes required. disabled due to back pain Married Two children Never Smoked Alcohol use-no Drug use-no 2 reg soft drinks a day  Review of Systems      See HPI General:  Complains of fatigue and sleep disorder. Eyes:  Denies blurring and discharge. MS:  Complains of low  back pain and mid back pain; unchanged, severe, disabling. Psych:  Complains of anxiety, depression, and easily tearful; denies mental problems, suicidal thoughts/plans, thoughts of violence, and unusual visions or sounds; relaqted to psychsocial stress of a failing marriage.  Physical Exam  General:  Well-developed,obese,in no acute distress; alert,appropriate and cooperative throughout examination HEENT: No  facial asymmetry,  EOMI, No sinus tenderness, TM's Clear, oropharynx  pink and moist.   Chest: Clear to auscultation bilaterally.  CVS: S1, S2, No murmurs, No S3.   Abd: Soft, Nontender.  MS: decreased  ROM spine,adequate in  hips, shoulders and knees.  Ext: No edema.   CNS: CN 2-12 intact, power tone and sensation normal throughout.   Skin: Intact, no visible lesions or rashes.  Psych: Good eye contact, normal affect.  Memory intact,  depressed appearing.and tearful    Impression & Recommendations:  Problem # 1:  HYPERTENSION (ICD-401.9) Assessment Unchanged  Her updated medication list for this problem includes:    Maxzide 75-50 Mg Tabs (Triamterene-hctz) .Marland Kitchen... Take 1 tablet by mouth once a day    Amlodipine Besylate 10 Mg Tabs (Amlodipine besylate) .Marland Kitchen... Take 1 tablet by mouth once a day    Benazepril Hcl 40 Mg Tabs (Benazepril hcl) .Marland Kitchen... Take 1 tablet by mouth once a day  Orders: T-Basic Metabolic Panel [id number])  BP today: 110/80 Prior BP: 110/50 (03/15/2010)  Labs Reviewed: K+: 4.0 (11/15/2009) Creat: : 1.45 (11/15/2009)   Chol: 223 (07/06/2009)   HDL: 37 (07/06/2009)   LDL: 139 (07/06/2009)   TG: 236 (07/06/2009)  Problem # 2:  OBESITY (ICD-278.00) Assessment: Deteriorated  Ht: 63 (07/19/2010)   Wt: 206.50 (07/19/2010)   BMI: 36.71 (07/19/2010) resume phentermine  Problem # 3:  LOW BACK PAIN (ICD-724.2) Assessment: Deteriorated  Her updated medication list for this problem includes:    Robaxin 500 Mg Tabs (Methocarbamol) .Marland Kitchen... 1 by mouth q 6 as needed locking  catching or pain    Tylenol Extra Strength 500 Mg Tabs (Acetaminophen) .Marland Kitchen... 1 tablet twice daily as needed    Vicodin Es 7.5-750 Mg Tabs (Hydrocodone-acetaminophen) .Marland Kitchen... Take 1 tab by mouth at bedtime  Complete Medication List: 1)  Zyrtec Allergy 10 Mg Tabs (Cetirizine hcl) .... One tab by mouth once daily 2)  Maxzide 75-50 Mg Tabs (Triamterene-hctz) .... Take 1 tablet by mouth once a day 3)   Robaxin 500 Mg Tabs (Methocarbamol) .Marland Kitchen.. 1 by mouth q 6 as needed locking  catching or pain 4)  Tylenol Extra Strength 500 Mg Tabs (Acetaminophen) .Marland Kitchen.. 1 tablet twice daily as needed 5)  Vicodin Es 7.5-750 Mg Tabs (Hydrocodone-acetaminophen) .... Take 1 tab by mouth at bedtime 6)  Amlodipine Besylate 10 Mg Tabs (Amlodipine besylate) .... Take 1 tablet by mouth once a day 7)  Phentermine Hcl 37.5 Mg Tabs (Phentermine hcl) .... Take 1 tablet by mouth once a day 8)  Benazepril Hcl 40 Mg Tabs (Benazepril hcl) .... Take 1 tablet by mouth once a day 9)  Multivitamins Tabs (Multiple vitamin) .... One tab by mouth once daily  Other Orders: T-Lipid Profile [id number]) T-CBC w/Diff [id number]) T-TSH [phone]) T-Vitamin D (25-Hydroxy) [phone]) Radiology Referral (Radiology)  Patient Instructions: 1)  CPE in 3 months. 2)  You need to lose weight. Consider a lower calorie diet and regular exercise.  3)  BMP prior to visit, ICD-9: 4)  Lipid Panel prior to visit, ICD-9: 5)  TSH prior to visit, ICD-9: 6)  CBC w/ Diff prior to visit, ICD-9: 7)  vitamin d  Orders Added: 1)  Est. Patient Level IV [[address]] 2)  T-Basic Metabolic Panel [80048-22910] 3)  T-Lipid Profile [80061-22930] 4)  T-CBC w/Diff [60454-09811] 5)  T-TSH [91478-29562] 6)  T-Vitamin D (25-Hydroxy) [13086-57846] 7)  Radiology Referral [Radiology]

## 2010-11-01 NOTE — Progress Notes (Signed)
Summary: diet pills  Phone Note Call from Patient   Summary of Call: needs her diet pills phar has seen nothing on it     [facility]  thought they was called in last tuesday Initial call taken by: Lind Guest,  July 28, 2010 12:52 PM  Follow-up for Phone Call        i do not recall, is this to be refilled? Follow-up by: Adella Hare LPN,  July 28, 2010 3:25 PM  Additional Follow-up for Phone Call Additional follow up Details #1::        pls staqmp and fax to [facility] and let pt know Additional Follow-up by: Syliva Overman MD,  July 28, 2010 5:00 PM    New/Updated Medications: PHENTERMINE HCL 37.5 MG TABS (PHENTERMINE HCL) Take 1 tablet by mouth once a day Prescriptions: PHENTERMINE HCL 37.5 MG TABS (PHENTERMINE HCL) Take 1 tablet by mouth once a day  #30 x 3   Entered by:   Adella Hare LPN   Authorized by:   Syliva Overman MD   Signed by:   Adella Hare LPN on 16/07/9603   Method used:   Printed then faxed to ...       [address] [phone]* (retail)       [address]       [place], [place]  [address]       Ph: [phone] or [phone]       Fax: [phone]   RxID:   [id number] PHENTERMINE HCL 37.5 MG TABS (PHENTERMINE HCL) Take 1 tablet by mouth once a day  #30 x 3   Entered and Authorized by:   Syliva Overman MD   Signed by:   Syliva Overman MD on 07/28/2010   Method used:   Printed then faxed to ...       [address] [phone]* (retail)       [address]       [place], [place]  [address]       Ph: [phone] or [phone]       Fax: [phone]   RxID:   [id number]

## 2010-11-01 NOTE — Progress Notes (Signed)
  Phone Note From Pharmacy   Caller: [address] [place]. [phone]* Summary of Call: requesting refill on phentermine Initial call taken by: Adella Hare LPN,  December 21, 2009 3:11 PM  Follow-up for Phone Call        redfill x 1 pls Follow-up by: Syliva Overman MD,  December 22, 2009 8:07 PM    Prescriptions: PHENTERMINE HCL 37.5 MG TABS (PHENTERMINE HCL) Take 1 tablet by mouth once a day  #30 x 0   Entered by:   Adella Hare LPN   Authorized by:   Syliva Overman MD   Signed by:   Adella Hare LPN on 14/78/2956   Method used:   Printed then faxed to ...       [address] [phone]* (retail)       [address]       [place], [place]  [address]       Ph: [phone] or [phone]       Fax: [phone]   RxID:   [id number]

## 2010-11-01 NOTE — Letter (Signed)
Summary: Historic Patient File  Historic Patient File   Imported By: Lind Guest 10/04/2009 13:44:57  _____________________________________________________________________  External Attachment:    Type:   Image     Comment:   External Document

## 2010-11-01 NOTE — Letter (Signed)
Summary: Medical record request Disab Determin  Medical record request Disab Determin   Imported By: Cammie Sickle 01/22/2010 12:16:59  _____________________________________________________________________  External Attachment:    Type:   Image     Comment:   External Document

## 2010-11-01 NOTE — Letter (Signed)
Summary: MEDICAL RELEASE  MEDICAL RELEASE   Imported By: Lind Guest 05/10/2010 14:16:12  _____________________________________________________________________  External Attachment:    Type:   Image     Comment:   External Document

## 2010-11-01 NOTE — Progress Notes (Signed)
  Phone Note From Pharmacy   Caller: [address] [place]. [phone]* Summary of Call: requesting refills on hydrocodone 7.5 750 Initial call taken by: Adella Hare LPN,  April 12, 2010 10:04 AM  Follow-up for Phone Call        This was done 04-07-10.  Pharmacy should have received prescription already. Follow-up by: Esperanza Sheets PA,  April 12, 2010 10:15 AM

## 2010-11-03 ENCOUNTER — Ambulatory Visit (HOSPITAL_COMMUNITY)
Admission: RE | Admit: 2010-11-03 | Discharge: 2010-11-03 | Disposition: A | Discharge status: Home / Self Care | Payer: Managed Care, Other (non HMO) | Source: Ambulatory Visit | Attending: Family Medicine | Admitting: Family Medicine

## 2010-11-03 DIAGNOSIS — N83209 Unspecified ovarian cyst, unspecified side: Secondary | ICD-10-CM | POA: Insufficient documentation

## 2010-11-03 DIAGNOSIS — R1900 Intra-abdominal and pelvic swelling, mass and lump, unspecified site: Secondary | ICD-10-CM

## 2010-11-03 DIAGNOSIS — R1032 Left lower quadrant pain: Secondary | ICD-10-CM | POA: Insufficient documentation

## 2010-11-03 DIAGNOSIS — R9389 Abnormal findings on diagnostic imaging of other specified body structures: Secondary | ICD-10-CM | POA: Insufficient documentation

## 2010-11-03 NOTE — Progress Notes (Signed)
Summary: medicine  Phone Note Call from Patient   Summary of Call: left message that the pharmacy did not receive her rx for her maxide and lotrel please send today ? call [phone] Initial call taken by: Lind Guest,  October 28, 2010 7:47 AM  Follow-up for Phone Call        Resent to [facility] Follow-up by: Everitt Amber LPN,  October 28, 2010 8:59 AM    Prescriptions: BENAZEPRIL HCL 40 MG TABS (BENAZEPRIL HCL) Take 1 tablet by mouth once a day  #90 x 1   Entered by:   Everitt Amber LPN   Authorized by:   Syliva Overman MD   Signed by:   Everitt Amber LPN on 78/29/5621   Method used:   Electronically to        [facility] [phone]* (retail)       [address]       [place], [place]  [address]       Ph: [phone] or [phone]       Fax: [phone]   RxID:   [id number] AMLODIPINE BESYLATE 10 MG TABS (AMLODIPINE BESYLATE) Take 1 tablet by mouth once a day  #90 x 1   Entered by:   Everitt Amber LPN   Authorized by:   Syliva Overman MD   Signed by:   Everitt Amber LPN on 18/84/1660   Method used:   Electronically to        [facility] [phone]* (retail)       [address]       [place], [place]  [address]       Ph: [phone] or [phone]       Fax: [phone]   RxID:   [id number] MAXZIDE 75-50 MG TABS (TRIAMTERENE-HCTZ) Take 1 tablet by mouth once a day  #90 x 1   Entered by:   Everitt Amber LPN   Authorized by:   Syliva Overman MD   Signed by:   Everitt Amber LPN on 27/12/5007   Method used:   Electronically to        [facility] [phone]* (retail)       [address]       [place], [place]  [address]       Ph: [phone] or [phone]       Fax: [phone]   RxID:   [id number]

## 2010-11-09 NOTE — Assessment & Plan Note (Signed)
Summary: cpe in 3 month/slj   Vital Signs:  Patient profile:   45 year old female Menstrual status:  regular LMP:     10/16/2010 Height:      63 inches Weight:      205.63 pounds BMI:     36.56 O2 Sat:      98 % on Room air Pulse rate:   93 / minute Pulse rhythm:   regular Resp:     16 per minute BP sitting:   120 / 80  (left arm)  Vitals Entered By: Adella Hare LPN (October 27, 2010 2:05 PM)  Nutrition Counseling: Patient's BMI is greater than 25 and therefore counseled on weight management options.  O2 Flow:  Room air CC: cpe Is Patient Diabetic? No  Vision Screening:Left eye w/o correction: 20 / 25 Right Eye w/o correction: 20 / 20 Both eyes w/o correction:  20/ 20        Vision Entered By: Adella Hare LPN (October 27, 2010 2:51 PM) LMP (date): 10/16/2010     Enter LMP: 10/16/2010 Last PAP Result NEGATIVE FOR INTRAEPITHELIAL LESIONS OR MALIGNANCY.   CC:  cpe.  History of Present Illness: Reports  that  she continues to be not very well, due to disabling back pain and lower extremity weakness. Denies recent fever or chills. Denies sinus pressure, nasal congestion , ear pain or sore throat. Denies chest congestion, or cough productive of sputum. Denies chest pain, palpitations, PND, orthopnea or leg swelling. Denies abdominal pain, nausea, vomitting, diarrhea or constipation. Denies change in bowel movements or bloody stool. Denies dysuria , frequency, incontinence or hesitancy.  Denies headaches, vertigo, seizures. Denies depression, anxiety or insomnia. Denies  rash, lesions, or itch.     Current Medications (verified): 1)  Zyrtec Allergy 10 Mg  Tabs (Cetirizine Hcl) .... One Tab By Mouth Once Daily 2)  Maxzide 75-50 Mg Tabs (Triamterene-Hctz) .... Take 1 Tablet By Mouth Once A Day 3)  Robaxin 500 Mg Tabs (Methocarbamol) .Marland Kitchen.. 1 By Mouth Q 6 As Needed Locking  Catching or Pain 4)  Tylenol Extra Strength 500 Mg Tabs (Acetaminophen) .Marland Kitchen.. 1 Tablet Twice  Daily As Needed 5)  Vicodin Es 7.5-750 Mg Tabs (Hydrocodone-Acetaminophen) .... Take 1 Tab By Mouth At Bedtime 6)  Amlodipine Besylate 10 Mg Tabs (Amlodipine Besylate) .... Take 1 Tablet By Mouth Once A Day 7)  Benazepril Hcl 40 Mg Tabs (Benazepril Hcl) .... Take 1 Tablet By Mouth Once A Day 8)  Multivitamins  Tabs (Multiple Vitamin) .... One Tab By Mouth Once Daily 9)  Phentermine Hcl 37.5 Mg Tabs (Phentermine Hcl) .... Take 1 Tablet By Mouth Once A Day  Allergies (verified): No Known Drug Allergies  Review of Systems      See HPI General:  Complains of fatigue. Eyes:  Denies blurring and discharge. MS:  Complains of low back pain, mid back pain, muscle weakness, and stiffness. Derm:  Complains of dryness and rash. Neuro:  Complains of numbness and weakness. Psych:  Complains of anxiety and depression; denies mental problems, suicidal thoughts/plans, thoughts of violence, unusual visions or sounds, and thoughts /plans of harming others. Endo:  Denies cold intolerance, excessive hunger, excessive thirst, excessive urination, and heat intolerance. Heme:  Denies abnormal bruising and bleeding. Allergy:  Complains of seasonal allergies.  Physical Exam  General:  Well-developed,well-nourished,in no acute distress; alert,appropriate and cooperative throughout examination Head:  Normocephalic and atraumatic without obvious abnormalities. No apparent alopecia or balding. Eyes:  No corneal or conjunctival inflammation noted.  EOMI. Perrla. Funduscopic exam benign, without hemorrhages, exudates or papilledema. Vision grossly normal. Ears:  External ear exam shows no significant lesions or deformities.  Otoscopic examination reveals clear canals, tympanic membranes are intact bilaterally without bulging, retraction, inflammation or discharge. Hearing is grossly normal bilaterally. Nose:  External nasal examination shows no deformity or inflammation. Nasal mucosa are pink and moist without lesions or  exudates. Mouth:  Oral mucosa and oropharynx without lesions or exudates.  Teeth in good repair. Neck:  No deformities, masses, or tenderness noted. Chest Wall:  No deformities, masses, or tenderness noted. Breasts:  No mass, nodules, thickening, tenderness, bulging, retraction, inflamation, nipple discharge or skin changes noted.   Lungs:  Normal respiratory effort, chest expands symmetrically. Lungs are clear to auscultation, no crackles or wheezes. Heart:  Normal rate and regular rhythm. S1 and S2 normal without gallop, murmur, click, rub or other extra sounds. Abdomen:  Bowel sounds positive,abdomen soft and non-tender without masses, organomegaly or hernias noted.Obese Rectal:  No external abnormalities noted. Normal sphincter tone. No rectal masses or tenderness. Genitalia:  normal introitus and no vaginal atrophy.  Uterus enlarged, questionable ovarian mass. Physiologic vag d/c . No cervical motion or adnexal tenderness Msk:  scoliosis of thoracolumbar spine Pulses:  R and L carotid,radial,femoral,dorsalis pedis and posterior tibial pulses are full and equal bilaterally Extremities:  No clubbing, cyanosis, edema, or deformity noted withreduced ROM of thoracolumbvar spine Neurologic:  No cranial nerve deficits noted. Station and gait are normal. Plantar reflexes are down-going bilaterally. DTRs are symmetrical throughout. Sensory, motor and coordinative functions appear intact. Skin:  Intact without suspicious lesions or rashes Cervical Nodes:  No lymphadenopathy noted Axillary Nodes:  No palpable lymphadenopathy Inguinal Nodes:  No significant adenopathy Psych:  Cognition and judgment appear intact. Alert and cooperative with normal attention span and concentration. No apparent delusions, illusions, hallucinations   Impression & Recommendations:  Problem # 1:  OVARIAN CYST (ICD-620.2) Assessment Comment Only  Orders: Gynecologic Referral (Gyn)  Problem # 2:  PELVIC MASS  (ICD-789.30) Assessment: Comment Only  Future Orders: Radiology Referral (Radiology) ... 10/28/2010 Radiology Referral (Radiology) ... 10/28/2010  Problem # 3:  LOW BACK PAIN (ICD-724.2) Assessment: Unchanged  Her updated medication list for this problem includes:    Robaxin 500 Mg Tabs (Methocarbamol) .Marland Kitchen... 1 by mouth q 6 as needed locking  catching or pain    Tylenol Extra Strength 500 Mg Tabs (Acetaminophen) .Marland Kitchen... 1 tablet twice daily as needed    Vicodin Es 7.5-750 Mg Tabs (Hydrocodone-acetaminophen) .Marland Kitchen... Take 1 tab by mouth at bedtime continues to be disabling  Problem # 4:  OBESITY (ICD-278.00) Assessment: Improved  Ht: 63 (10/27/2010)   Wt: 205.63 (10/27/2010)   BMI: 36.56 (10/27/2010) therapeutic lifestyle change discussed and encouraged  Problem # 5:  HYPERTENSION (ICD-401.9) Assessment: Unchanged  The following medications were removed from the medication list:    Benazepril Hcl 40 Mg Tabs (Benazepril hcl) .Marland Kitchen... Take 1 tablet by mouth once a day Her updated medication list for this problem includes:    Maxzide 75-50 Mg Tabs (Triamterene-hctz) .Marland Kitchen... Take 1 tablet by mouth once a day    Amlodipine Besylate 10 Mg Tabs (Amlodipine besylate) .Marland Kitchen... Take 1 tablet by mouth once a day    Benazepril Hcl 40 Mg Tabs (Benazepril hcl) .Marland Kitchen... Take 1 tablet by mouth once a day  BP today: 120/80 Prior BP: 110/80 (07/19/2010)  Labs Reviewed: K+: 3.7 (07/19/2010) Creat: : 1.40 (07/19/2010)   Chol: 203 (07/19/2010)   HDL: 51 (07/19/2010)  LDL: 113 (07/19/2010)   TG: 196 (07/19/2010)  Complete Medication List: 1)  Zyrtec Allergy 10 Mg Tabs (Cetirizine hcl) .... One tab by mouth once daily 2)  Maxzide 75-50 Mg Tabs (Triamterene-hctz) .... Take 1 tablet by mouth once a day 3)  Robaxin 500 Mg Tabs (Methocarbamol) .Marland Kitchen.. 1 by mouth q 6 as needed locking  catching or pain 4)  Tylenol Extra Strength 500 Mg Tabs (Acetaminophen) .Marland Kitchen.. 1 tablet twice daily as needed 5)  Vicodin Es 7.5-750 Mg  Tabs (Hydrocodone-acetaminophen) .... Take 1 tab by mouth at bedtime 6)  Amlodipine Besylate 10 Mg Tabs (Amlodipine besylate) .... Take 1 tablet by mouth once a day 7)  Multivitamins Tabs (Multiple vitamin) .... One tab by mouth once daily 8)  Phentermine Hcl 37.5 Mg Tabs (Phentermine hcl) .... Take 1 tablet by mouth once a day 9)  Benazepril Hcl 40 Mg Tabs (Benazepril hcl) .... Take 1 tablet by mouth once a day  Other Orders: Pap Smear (04540) Hemoccult Guaiac-1 spec.(in office) (98119)  Patient Instructions: 1)  Please schedule a follow-up appointment in 5.5 months. 2)  It is important that you exercise regularly at least 20 minutes 5 times a week. If you develop chest pain, have severe difficulty breathing, or feel very tired , stop exercising immediately and seek medical attention. 3)  You need to lose weight. Consider a lower calorie diet and regular exercise.  4)  No med changes. 5)  No labs due. 6)  All the best for 2012, keep the faith Prescriptions: AMLODIPINE BESYLATE 10 MG TABS (AMLODIPINE BESYLATE) Take 1 tablet by mouth once a day  #90 x 1   Entered by:   Adella Hare LPN   Authorized by:   Syliva Overman MD   Signed by:   Adella Hare LPN on 14/78/2956   Method used:   Electronically to        [facility] [phone]* (retail)       [address]       [place], [place]  [address]       Ph: [phone] or [phone]       Fax: [phone]   RxID:   [id number] ROBAXIN 500 MG TABS (METHOCARBAMOL) 1 by mouth q 6 as needed locking  catching or pain  #28 x 03   Entered by:   Adella Hare LPN   Authorized by:   Syliva Overman MD   Signed by:   Adella Hare LPN on 95/18/8416   Method used:   Electronically to        [facility] [phone]* (retail)       [address]       [place], [place]  [address]       Ph: [phone] or [phone]       Fax: [phone]   RxID:   [id number] MAXZIDE 75-50 MG TABS (TRIAMTERENE-HCTZ)  Take 1 tablet by mouth once a day  #90 x 1   Entered by:   Adella Hare LPN   Authorized by:   Syliva Overman MD   Signed by:   Adella Hare LPN on 46/27/0350   Method used:   Electronically to        [facility] [phone]* (retail)       [address]       [place], [place]  [address]  Ph: [phone] or [phone]       Fax: [phone]   RxID:   [id number] BENAZEPRIL HCL 40 MG TABS (BENAZEPRIL HCL) Take 1 tablet by mouth once a day  #90 x 3   Entered and Authorized by:   Syliva Overman MD   Signed by:   Syliva Overman MD on 10/27/2010   Method used:   Printed then faxed to ...       [address] [phone]* (retail)       [address]       [place], [place]  [address]       Ph: [phone] or [phone]       Fax: [phone]   RxID:   [id number]    Orders Added: 1)  Est. Patient 40-64 years [99396] 2)  Pap Smear [88150] 3)  Hemoccult Guaiac-1 spec.(in office) [82270] 4)  Radiology Referral [Radiology] 5)  Radiology Referral [Radiology] 6)  Gynecologic Referral [Gyn]    Laboratory Results  Date/Time Received: October 27, 2010 2:49 PM  Date/Time Reported: October 27, 2010 2:49 PM   Stool - Occult Blood Hemmoccult #1: negative Date: 10/27/2010 Comments: 50201 10L 02/13 118 10/12 Adella Hare LPN  October 27, 2010 2:50 PM

## 2010-11-09 NOTE — Letter (Signed)
Summary: Letter  Letter   Imported By: Lind Guest 11/01/2010 13:18:43  _____________________________________________________________________  External Attachment:    Type:   Image     Comment:   External Document

## 2010-11-18 ENCOUNTER — Encounter: Payer: Self-pay | Admitting: Family Medicine

## 2010-12-08 NOTE — Letter (Signed)
Summary: [facility] tree  [facility]   Imported By: Lind Guest 11/29/2010 09:16:33  _____________________________________________________________________  External Attachment:    Type:   Image     Comment:   External Document

## 2011-01-23 ENCOUNTER — Telehealth: Payer: Self-pay | Admitting: Family Medicine

## 2011-01-23 NOTE — Telephone Encounter (Signed)
Are her papers ready for pickup?

## 2011-01-23 NOTE — Telephone Encounter (Signed)
Form completed , she may collect

## 2011-01-24 DIAGNOSIS — Z0279 Encounter for issue of other medical certificate: Secondary | ICD-10-CM

## 2011-01-24 NOTE — Telephone Encounter (Signed)
Patient has picked up °

## 2011-02-14 ENCOUNTER — Other Ambulatory Visit: Payer: Self-pay

## 2011-02-14 MED ORDER — PHENTERMINE HCL 37.5 MG PO TABS: 37.5000 mg | ORAL_TABLET | Freq: Every day | ORAL | Status: DC

## 2011-04-12 ENCOUNTER — Encounter: Payer: Self-pay | Admitting: Family Medicine

## 2011-04-19 ENCOUNTER — Ambulatory Visit (INDEPENDENT_AMBULATORY_CARE_PROVIDER_SITE_OTHER): Payer: PRIVATE HEALTH INSURANCE | Admitting: Family Medicine

## 2011-04-19 ENCOUNTER — Encounter: Payer: Self-pay | Admitting: Family Medicine

## 2011-04-19 VITALS — BP 120/80 | HR 69 | Resp 16 | Ht 63.0 in | Wt 209.0 lb

## 2011-04-19 DIAGNOSIS — M545 Low back pain, unspecified: Secondary | ICD-10-CM

## 2011-04-19 DIAGNOSIS — Z13820 Encounter for screening for osteoporosis: Secondary | ICD-10-CM

## 2011-04-19 DIAGNOSIS — R5383 Other fatigue: Secondary | ICD-10-CM

## 2011-04-19 DIAGNOSIS — E669 Obesity, unspecified: Secondary | ICD-10-CM

## 2011-04-19 DIAGNOSIS — Z13220 Encounter for screening for lipoid disorders: Secondary | ICD-10-CM

## 2011-04-19 DIAGNOSIS — I10 Essential (primary) hypertension: Secondary | ICD-10-CM

## 2011-04-19 DIAGNOSIS — R5381 Other malaise: Secondary | ICD-10-CM

## 2011-04-19 DIAGNOSIS — R1900 Intra-abdominal and pelvic swelling, mass and lump, unspecified site: Secondary | ICD-10-CM

## 2011-04-19 DIAGNOSIS — M549 Dorsalgia, unspecified: Secondary | ICD-10-CM

## 2011-04-19 MED ORDER — KETOROLAC TROMETHAMINE 60 MG/2ML IM SOLN
60.0000 mg | Freq: Once | INTRAMUSCULAR | Status: AC
  Administered 2011-04-19: 60 mg via INTRAMUSCULAR

## 2011-04-19 MED ORDER — METHYLPREDNISOLONE ACETATE 80 MG/ML IJ SUSP
80.0000 mg | Freq: Once | INTRAMUSCULAR | Status: AC
  Administered 2011-04-19: 80 mg via INTRAMUSCULAR

## 2011-04-19 MED ORDER — AMLODIPINE BESYLATE 10 MG PO TABS: 10.0000 mg | ORAL_TABLET | Freq: Every day | ORAL | Status: DC

## 2011-04-19 MED ORDER — TRIAMTERENE-HCTZ 75-50 MG PO TABS: 1.0000 | ORAL_TABLET | Freq: Every day | ORAL | Status: DC

## 2011-04-19 MED ORDER — PREDNISONE (PAK) 5 MG PO TABS: 5.0000 mg | ORAL_TABLET | ORAL | Status: DC

## 2011-04-19 NOTE — Patient Instructions (Addendum)
F/U in 3.5 months.  Fasting labs in 3.5 months.  A healthy diet is rich in fruit, vegetables and whole grains. Poultry fish, nuts and beans are a healthy choice for protein rather then red meat. A low sodium diet and drinking 64 ounces of water daily is generally recommended. Oils and sweet should be limited. Carbohydrates especially for those who are diabetic or overweight, should be limited to 34-45 gram per meal. It is important to eat on a regular schedule, at least 3 times daily. Snacks should be primarily fruits, vegetables or nuts. It is important that you exercise regularly at least 30 minutes 5 times a week. If you develop chest pain, have severe difficulty breathing, or feel very tired, stop exercising immediately and seek medical attention   You have gained 9 pounds, pls try to lose this in the next 3 months, we will provide a 1500 cal diet sheet.  Pls continue your walk in faith  F/U pelvic ultrasound will be ordered and you will be referred back to gynecology if abnormal

## 2011-04-20 NOTE — Progress Notes (Signed)
  Subjective:    Patient ID: Brittany Maxwell, female    DOB: 07/23/1966, 45 y.o.   MRN: [id number]  HPI The PT is here for follow up and re-evaluation of chronic medical conditions, medication management and review of recent lab and radiology data.  Preventive health is updated, specifically  Cancer screening, Osteoporosis screening and Immunization.   Questions or concerns regarding consultations or procedures which the PT has had in the interim are  addressed. The PT denies any adverse reactions to current medications since the last visit.  There are no new concerns.      Review of Systems Denies recent fever or chills. Denies sinus pressure, nasal congestion, ear pain or sore throat. Denies chest congestion, productive cough or wheezing. Denies chest pains, palpitations, paroxysmal nocturnal dyspnea, orthopnea and leg swelling Denies abdominal pain, nausea, vomiting,diarrhea or constipation.  Denies rectal bleeding or change in bowel movement. Denies dysuria, frequency, hesitancy or incontinence. Continued disabling back pain which limits her ADL's, states that after approx 15 minutes of standing sitting or lying she experiences great pain. Denies headaches, seizure, numbness, or tingling. Still experiencing significant unhappiness in her marriage, and is entrusting the situation to God  Denies skin break down or rash.          Objective:   Physical Exam    Patient alert and oriented and in no Cardiopulmonary distress.  HEENT: No facial asymmetry, EOMI, no sinus tenderness, TM's clear, Oropharynx pink and moist.  Neck supple no adenopathy.  Chest: Clear to auscultation bilaterally.  CVS: S1, S2 no murmurs, no S3.  ABD: Soft non tender. Bowel sounds normal.  Ext: No edema  MS: decreased  ROM spine, adequate in shoulders, hips and knees.  Skin: Intact, no ulcerations or rash noted.  Psych: Good eye contact, normal affect. Memory intact tearful a times when she  speaks of her marriage and depressed appearing.  CNS: CN 2-12 intact, power, tone and sensation normal throughout.     Assessment & Plan:

## 2011-04-23 NOTE — Assessment & Plan Note (Signed)
Controlled, no change in medication  

## 2011-04-23 NOTE — Assessment & Plan Note (Signed)
Deteriorated. Patient re-educated about  the importance of commitment to a  minimum of 150 minutes of exercise per week. The importance of healthy food choices with portion control discussed. Encouraged to start a food diary, count calories and to consider  joining a support group. Sample diet sheets offered. Goals set by the patient for the next several months.    

## 2011-04-23 NOTE — Assessment & Plan Note (Signed)
Unchanged , continues to be disabling, med prescribed

## 2011-04-23 NOTE — Assessment & Plan Note (Signed)
Pt was referred to and saw gynae earlier this year, rept study has not been done and she has  Been lost to f/u Will order rept [place] and arrange gynae f/u again if abn this time I will speak directly with the provider

## 2011-04-24 ENCOUNTER — Other Ambulatory Visit: Payer: Self-pay | Admitting: Family Medicine

## 2011-04-24 DIAGNOSIS — R1900 Intra-abdominal and pelvic swelling, mass and lump, unspecified site: Secondary | ICD-10-CM

## 2011-04-25 ENCOUNTER — Other Ambulatory Visit: Payer: PRIVATE HEALTH INSURANCE

## 2011-04-25 ENCOUNTER — Ambulatory Visit (HOSPITAL_COMMUNITY): Payer: PRIVATE HEALTH INSURANCE

## 2011-04-25 ENCOUNTER — Ambulatory Visit (HOSPITAL_COMMUNITY)
Admission: RE | Admit: 2011-04-25 | Discharge: 2011-04-25 | Disposition: A | Discharge status: Home / Self Care | Payer: PRIVATE HEALTH INSURANCE | Source: Ambulatory Visit | Attending: Family Medicine | Admitting: Family Medicine

## 2011-04-25 ENCOUNTER — Other Ambulatory Visit: Payer: Self-pay | Admitting: Family Medicine

## 2011-04-25 DIAGNOSIS — R1900 Intra-abdominal and pelvic swelling, mass and lump, unspecified site: Secondary | ICD-10-CM | POA: Insufficient documentation

## 2011-04-25 DIAGNOSIS — N949 Unspecified condition associated with female genital organs and menstrual cycle: Secondary | ICD-10-CM | POA: Insufficient documentation

## 2011-05-04 ENCOUNTER — Telehealth: Payer: Self-pay | Admitting: Family Medicine

## 2011-05-04 MED ORDER — PHENTERMINE HCL 37.5 MG PO TABS: 37.5000 mg | ORAL_TABLET | Freq: Every day | ORAL | Status: DC

## 2011-05-04 NOTE — Telephone Encounter (Signed)
Should phentermine be refilled?

## 2011-05-04 NOTE — Telephone Encounter (Signed)
MED SENT 

## 2011-05-04 NOTE — Telephone Encounter (Signed)
Yes x 3

## 2011-06-14 ENCOUNTER — Telehealth: Payer: Self-pay | Admitting: Family Medicine

## 2011-06-14 MED ORDER — HYDROCODONE-ACETAMINOPHEN 7.5-750 MG PO TABS
1.0000 | ORAL_TABLET | Freq: Every evening | ORAL | Status: DC | PRN
Start: 2011-06-14 — End: 2012-05-09

## 2011-06-14 NOTE — Telephone Encounter (Signed)
Ok to refill? Hasn't been filled since we went to [facility]

## 2011-06-14 NOTE — Telephone Encounter (Signed)
Med sent and patient aware 

## 2011-06-14 NOTE — Telephone Encounter (Signed)
Refill x1please  And let her know

## 2011-06-16 ENCOUNTER — Other Ambulatory Visit: Payer: Self-pay | Admitting: Family Medicine

## 2011-06-16 ENCOUNTER — Telehealth: Payer: Self-pay | Admitting: Family Medicine

## 2011-06-16 DIAGNOSIS — R52 Pain, unspecified: Secondary | ICD-10-CM

## 2011-06-16 NOTE — Telephone Encounter (Signed)
Spoke with patient and explained that i do not complete ASSESMENTS FOR PAIN AND ABILITY TO BEND AND STOOP  For disability, i will set up appt wiht neurologist for this states the orthopod she has seen before stated he could not help her. I will send a letter to the firm letting them know this.  Please refer pt to Dr Gerilyn Pilgrim for evaluation of pain and impact on ability to work pls also send over the forms for him to complete, when you call , if I need to spk with dr Gerilyn Pilgrim about thos pls get me to the phone

## 2011-07-19 ENCOUNTER — Telehealth: Payer: Self-pay | Admitting: Family Medicine

## 2011-07-19 NOTE — Telephone Encounter (Signed)
Noted thanks °

## 2011-08-21 ENCOUNTER — Encounter: Payer: Self-pay | Admitting: Family Medicine

## 2011-08-28 ENCOUNTER — Ambulatory Visit (INDEPENDENT_AMBULATORY_CARE_PROVIDER_SITE_OTHER): Payer: PRIVATE HEALTH INSURANCE | Admitting: Family Medicine

## 2011-08-28 VITALS — BP 114/72 | HR 75 | Resp 18 | Ht 63.0 in | Wt 210.0 lb

## 2011-08-28 DIAGNOSIS — Z23 Encounter for immunization: Secondary | ICD-10-CM

## 2011-08-28 DIAGNOSIS — Z139 Encounter for screening, unspecified: Secondary | ICD-10-CM

## 2011-08-28 DIAGNOSIS — M545 Low back pain, unspecified: Secondary | ICD-10-CM

## 2011-08-28 DIAGNOSIS — I10 Essential (primary) hypertension: Secondary | ICD-10-CM

## 2011-08-28 DIAGNOSIS — Z79899 Other long term (current) drug therapy: Secondary | ICD-10-CM

## 2011-08-28 DIAGNOSIS — R7301 Impaired fasting glucose: Secondary | ICD-10-CM

## 2011-08-28 DIAGNOSIS — J309 Allergic rhinitis, unspecified: Secondary | ICD-10-CM | POA: Insufficient documentation

## 2011-08-28 DIAGNOSIS — R5383 Other fatigue: Secondary | ICD-10-CM

## 2011-08-28 DIAGNOSIS — R5381 Other malaise: Secondary | ICD-10-CM

## 2011-08-28 DIAGNOSIS — E669 Obesity, unspecified: Secondary | ICD-10-CM

## 2011-08-28 MED ORDER — PHENTERMINE HCL 37.5 MG PO TABS: 37.5000 mg | ORAL_TABLET | Freq: Every day | ORAL | Status: DC

## 2011-08-28 MED ORDER — FLUTICASONE PROPIONATE 50 MCG/ACT NA SUSP: 1.0000 | Freq: Every day | NASAL | Status: DC

## 2011-08-28 MED ORDER — TRIAMTERENE-HCTZ 75-50 MG PO TABS: 1.0000 | ORAL_TABLET | Freq: Every day | ORAL | Status: DC

## 2011-08-28 NOTE — Assessment & Plan Note (Signed)
Uncontrolled, requests handicap sticker, enrolled in local pain clinic

## 2011-08-28 NOTE — Assessment & Plan Note (Signed)
unchanged Patient re-educated about  the importance of commitment to a  minimum of 150 minutes of exercise per week. The importance of healthy food choices with portion control discussed. Encouraged to start a food diary, count calories and to consider  joining a support group. Sample diet sheets offered. Goals set by the patient for the next several months.    

## 2011-08-28 NOTE — Assessment & Plan Note (Signed)
Controlled, no change in medication  

## 2011-08-28 NOTE — Patient Instructions (Addendum)
cPE in 4 months  pls follow a diet low in fat and carbohydrates, weight loss goal of 2 to 3 pounds per month.  'My fitness pal' is a frree application on the internet which helps with calorie counting. Aim for 1200 to 1500 cals/day  tdAP and flu vaccine today.  Fasting labs today.  Flonase is new for allergies.   Handicap sticker today  pls schedule your mammogram it is past due

## 2011-08-28 NOTE — Progress Notes (Signed)
  Subjective:    Patient ID: Brittany Maxwell, female    DOB: September 01, 1966, 45 y.o.   MRN: [id number]  HPI The PT is here for follow up and re-evaluation of chronic medical conditions, medication management and review of any available recent lab and radiology data.  Preventive health is updated, specifically  Cancer screening and Immunization.   Questions or concerns regarding consultations or procedures which the PT has had in the interim are  addressed. The PT denies any adverse reactions to current medications since the last visit.  Continues to experience disabling back pain  , and requests a handicap sticker, which is appropriate based on symptoms and pathology      Review of Systems See HPI Denies recent fever or chills. Denies sinus pressure, nasal congestion, ear pain or sore throat. Denies chest congestion, productive cough or wheezing. Denies chest pains, palpitations and leg swelling Denies abdominal pain, nausea, vomiting,diarrhea or constipation.   Denies dysuria, frequency, hesitancy or incontinence.  Denies headaches, seizures, numbness, or tingling. Denies depression, anxiety or insomnia.States she is staying focused on the positive aspects of her life and leaving a lot of the negatives in God's hands  Denies skin break down or rash.        Objective:   Physical Exam Patient alert and oriented and in no cardiopulmonary distress.  HEENT: No facial asymmetry, EOMI, no sinus tenderness,  oropharynx pink and moist.  Neck supple no adenopathy.  Chest: Clear to auscultation bilaterally.  CVS: S1, S2 no murmurs, no S3.  ABD: Soft non tender. Bowel sounds normal.  Ext: No edema  MS: Decreased  ROM spine,adeuate in  shoulders, hips and knees.  Skin: Intact, no ulcerations or rash noted.  Psych: Good eye contact, normal affect. Memory intact not anxious or depressed appearing.  CNS: CN 2-12 intact, power, tone and sensation normal throughout.          Assessment & Plan:

## 2011-08-28 NOTE — Assessment & Plan Note (Signed)
Uncontrolled and increased, add flonase

## 2011-08-29 ENCOUNTER — Encounter: Payer: Self-pay | Admitting: Family Medicine

## 2011-08-29 LAB — CBC WITH DIFFERENTIAL/PLATELET
Basophils Absolute: 0 10*3/uL (ref 0.0–0.1)
Basophils Relative: 0 % (ref 0–1)
HCT: 34.4 % — ABNORMAL LOW (ref 36.0–46.0)
Lymphocytes Relative: 38 % (ref 12–46)
Monocytes Absolute: 0.6 10*3/uL (ref 0.1–1.0)
Neutro Abs: 6 10*3/uL (ref 1.7–7.7)
Neutrophils Relative %: 56 % (ref 43–77)
RDW: 13.9 % (ref 11.5–15.5)
WBC: 10.7 10*3/uL — ABNORMAL HIGH (ref 4.0–10.5)

## 2011-08-29 LAB — LIPID PANEL
Cholesterol: 197 mg/dL (ref 0–200)
Total CHOL/HDL Ratio: 4.2 Ratio

## 2011-08-29 LAB — BASIC METABOLIC PANEL
BUN: 14 mg/dL (ref 6–23)
CO2: 29 mEq/L (ref 19–32)
Chloride: 101 mEq/L (ref 96–112)
Glucose, Bld: 86 mg/dL (ref 70–99)
Potassium: 3.7 mEq/L (ref 3.5–5.3)
Sodium: 139 mEq/L (ref 135–145)

## 2011-08-29 LAB — HEMOGLOBIN A1C
Hgb A1c MFr Bld: 6 % — ABNORMAL HIGH (ref <5.7)
Mean Plasma Glucose: 126 mg/dL — ABNORMAL HIGH (ref <117)

## 2011-09-01 ENCOUNTER — Other Ambulatory Visit: Payer: Self-pay | Admitting: Family Medicine

## 2011-09-01 DIAGNOSIS — Z139 Encounter for screening, unspecified: Secondary | ICD-10-CM

## 2011-09-05 ENCOUNTER — Ambulatory Visit (HOSPITAL_COMMUNITY)
Admission: RE | Admit: 2011-09-05 | Discharge: 2011-09-05 | Disposition: A | Discharge status: Home / Self Care | Payer: 59 | Source: Ambulatory Visit | Attending: Family Medicine | Admitting: Family Medicine

## 2011-09-05 DIAGNOSIS — Z139 Encounter for screening, unspecified: Secondary | ICD-10-CM

## 2011-09-05 DIAGNOSIS — Z1231 Encounter for screening mammogram for malignant neoplasm of breast: Secondary | ICD-10-CM | POA: Insufficient documentation

## 2011-10-16 ENCOUNTER — Other Ambulatory Visit: Payer: Self-pay | Admitting: Family Medicine

## 2011-10-24 ENCOUNTER — Telehealth: Payer: Self-pay | Admitting: Family Medicine

## 2011-10-25 MED ORDER — PHENTERMINE HCL 37.5 MG PO TABS: 37.5000 mg | ORAL_TABLET | Freq: Every day | ORAL | Status: DC

## 2011-10-25 NOTE — Telephone Encounter (Signed)
Called pt and left message

## 2011-10-25 NOTE — Telephone Encounter (Signed)
She never got up the last phentermine rx that was sent in.

## 2011-12-26 ENCOUNTER — Ambulatory Visit (INDEPENDENT_AMBULATORY_CARE_PROVIDER_SITE_OTHER): Payer: Medicare Other | Admitting: Family Medicine

## 2011-12-26 ENCOUNTER — Encounter: Payer: Self-pay | Admitting: Family Medicine

## 2011-12-26 VITALS — BP 122/84 | HR 89 | Resp 16 | Ht 63.0 in | Wt 207.0 lb

## 2011-12-26 DIAGNOSIS — I10 Essential (primary) hypertension: Secondary | ICD-10-CM

## 2011-12-31 NOTE — Progress Notes (Signed)
  Subjective:    Patient ID: Brittany Maxwell, female    DOB: 02/10/1966, 46 y.o.   MRN: [id number]  HPI appt cancelled, pt on menses, rescheduled   Review of Systems     Objective:   Physical Exam        Assessment & Plan:

## 2012-01-03 ENCOUNTER — Ambulatory Visit (INDEPENDENT_AMBULATORY_CARE_PROVIDER_SITE_OTHER): Payer: Medicare Other | Admitting: Family Medicine

## 2012-01-03 ENCOUNTER — Other Ambulatory Visit (HOSPITAL_COMMUNITY)
Admission: RE | Admit: 2012-01-03 | Discharge: 2012-01-03 | Disposition: A | Discharge status: Home / Self Care | Payer: Medicare Other | Source: Ambulatory Visit | Attending: Family Medicine | Admitting: Family Medicine

## 2012-01-03 ENCOUNTER — Encounter: Payer: Self-pay | Admitting: Family Medicine

## 2012-01-03 VITALS — BP 118/66 | HR 87 | Resp 18 | Ht 63.0 in | Wt 207.1 lb

## 2012-01-03 DIAGNOSIS — Z79899 Other long term (current) drug therapy: Secondary | ICD-10-CM

## 2012-01-03 DIAGNOSIS — Z0000 Encounter for general adult medical examination without abnormal findings: Secondary | ICD-10-CM | POA: Diagnosis not present

## 2012-01-03 DIAGNOSIS — I10 Essential (primary) hypertension: Secondary | ICD-10-CM | POA: Diagnosis not present

## 2012-01-03 DIAGNOSIS — R7301 Impaired fasting glucose: Secondary | ICD-10-CM | POA: Diagnosis not present

## 2012-01-03 DIAGNOSIS — Z124 Encounter for screening for malignant neoplasm of cervix: Secondary | ICD-10-CM | POA: Insufficient documentation

## 2012-01-03 DIAGNOSIS — E669 Obesity, unspecified: Secondary | ICD-10-CM

## 2012-01-03 LAB — HEMOGLOBIN A1C: Mean Plasma Glucose: 134 mg/dL — ABNORMAL HIGH (ref <117)

## 2012-01-03 LAB — POC HEMOCCULT BLD/STL (OFFICE/1-CARD/DIAGNOSTIC): Fecal Occult Blood, POC: NEGATIVE

## 2012-01-03 MED ORDER — PHENTERMINE HCL 37.5 MG PO TABS: 37.5000 mg | ORAL_TABLET | Freq: Every day | ORAL | Status: DC

## 2012-01-03 NOTE — Progress Notes (Signed)
  Subjective:    Patient ID: Brittany Maxwell, female    DOB: [birth year], 46 y.o.   MRN: [id number]  HPI The PT is here for follow up and re-evaluation of chronic medical conditions, medication management and review of any available recent lab and radiology data.  Preventive health is updated, specifically  Cancer screening and Immunization.   Questions or concerns regarding consultations or procedures which the PT has had in the interim are  Addressed.Since starting cymbalta she reports marked improved in her back pain The PT denies any adverse reactions to current medications since the last visit.  There are no new concerns.  There are no specific complaints       Review of Systems See HPI Denies recent fever or chills. Denies sinus pressure, nasal congestion, ear pain or sore throat. Denies chest congestion, productive cough or wheezing. Denies chest pains, palpitations and leg swelling Denies abdominal pain, nausea, vomiting,diarrhea or constipation.   Denies dysuria, frequency, hesitancy or incontinence.  Denies headaches, seizures, numbness, or tingling. Denies depression, anxiety or insomnia. Denies skin break down or rash.        Objective:   Physical Exam Pleasant well nourished female, alert and oriented x 3, in no cardio-pulmonary distress. Afebrile. HEENT No facial trauma or asymetry. Sinuses non tender.  EOMI, PERTL, fundoscopic exam is normal, no hemorhage or exudate.  External ears normal, tympanic membranes clear. Oropharynx moist, no exudate, poor  dentition. Neck: supple, no adenopathy,JVD or thyromegaly.No bruits.  Chest: Clear to ascultation bilaterally.No crackles or wheezes. Non tender to palpation  Breast: No asymetry,no masses. No nipple discharge or inversion. No axillary or supraclavicular adenopathy  Cardiovascular system; Heart sounds normal,  S1 and  S2 ,no S3.  No murmur, or thrill. Apical beat not displaced Peripheral pulses  normal.  Abdomen: Soft, non tender, no organomegaly or masses. No bruits. Bowel sounds normal. No guarding, tenderness or rebound.  Rectal:  No mass. Guaiac negative stool.  GU: External genitalia normal. No lesions. Vaginal canal normal.No discharge. Uterus normal size, no adnexal masses, no cervical motion or adnexal tenderness.  Musculoskeletal exam: Decreased  ROM of spine,adequate in  hips , shoulders and knees. No deformity ,swelling or crepitus noted. No muscle wasting or atrophy.   Neurologic: Cranial nerves 2 to 12 intact. Power, tone ,sensation and reflexes normal throughout. No disturbance in gait. No tremor.  Skin: Intact, no ulceration, erythema , scaling or rash noted. Pigmentation normal throughout  Psych; Normal mood and affect. Judgement and concentration normal        Assessment & Plan:

## 2012-01-03 NOTE — Patient Instructions (Addendum)
F/u in 4 month  You have lost 3 pounds since last visit.  Please try to count calories, staying in 1500 to 1600 cal/day.  weight loss goal of 1.5 to 2 pounds per month.  I am thankful that your back pain is much improved.  Continue your positive outlook.  HBA1C today  Fasting lipid, cmp  In 4 months before visit  Pls call if you need me before.  Have a safe and happy Spring and Summer

## 2012-01-07 NOTE — Assessment & Plan Note (Signed)
Controlled, no change in medication  

## 2012-01-07 NOTE — Assessment & Plan Note (Signed)
Improved. Pt applauded on succesful weight loss through lifestyle change, and encouraged to continue same. Weight loss goal set for the next several months.  

## 2012-05-09 ENCOUNTER — Encounter: Payer: Self-pay | Admitting: Family Medicine

## 2012-05-09 ENCOUNTER — Ambulatory Visit (INDEPENDENT_AMBULATORY_CARE_PROVIDER_SITE_OTHER): Payer: Medicare Other | Admitting: Family Medicine

## 2012-05-09 VITALS — BP 112/80 | HR 94 | Resp 16 | Ht 63.0 in | Wt 207.4 lb

## 2012-05-09 DIAGNOSIS — R5381 Other malaise: Secondary | ICD-10-CM | POA: Diagnosis not present

## 2012-05-09 DIAGNOSIS — E669 Obesity, unspecified: Secondary | ICD-10-CM

## 2012-05-09 DIAGNOSIS — M545 Low back pain: Secondary | ICD-10-CM

## 2012-05-09 DIAGNOSIS — R7301 Impaired fasting glucose: Secondary | ICD-10-CM

## 2012-05-09 DIAGNOSIS — Z139 Encounter for screening, unspecified: Secondary | ICD-10-CM

## 2012-05-09 DIAGNOSIS — M949 Disorder of cartilage, unspecified: Secondary | ICD-10-CM

## 2012-05-09 DIAGNOSIS — J309 Allergic rhinitis, unspecified: Secondary | ICD-10-CM

## 2012-05-09 DIAGNOSIS — I10 Essential (primary) hypertension: Secondary | ICD-10-CM | POA: Diagnosis not present

## 2012-05-09 NOTE — Patient Instructions (Addendum)
Welcome to medicare visit  In December.  Fasting cbc, lipid, cmp, HBA1C , TSH, vit D   It is important that you exercise regularly at least 30 minutes 5 times a week. If you develop chest pain, have severe difficulty breathing, or feel very tired, stop exercising immediately and seek medical attention   A healthy diet is rich in fruit, vegetables and whole grains. Poultry fish, nuts and beans are a healthy choice for protein rather then red meat. A low sodium diet and drinking 64 ounces of water daily is generally recommended. Oils and sweet should be limited. Carbohydrates especially for those who are diabetic or overweight, should be limited to 30-45 gram per meal. It is important to eat on a regular schedule, at least 3 times daily. Snacks should be primarily fruits, vegetables or nuts.

## 2012-05-10 NOTE — Assessment & Plan Note (Signed)
Controlled on current meds, no change ?

## 2012-05-10 NOTE — Assessment & Plan Note (Signed)
Unchanged. Patient re-educated about  the importance of commitment to a  minimum of 150 minutes of exercise per week. The importance of healthy food choices with portion control discussed. Encouraged to start a food diary, count calories and to consider  joining a support group. Sample diet sheets offered. Goals set by the patient for the next several months.    

## 2012-05-10 NOTE — Progress Notes (Signed)
  Subjective:    Patient ID: Brittany Maxwell, female    DOB: 1966/07/09, 46 y.o.   MRN: [id number]  HPI The PT is here for follow up and re-evaluation of chronic medical conditions, medication management and review of any available recent lab and radiology data.  Preventive health is updated, specifically  Cancer screening and Immunization.   Questions or concerns regarding consultations or procedures which the PT has had in the interim are  addressed. The PT denies any adverse reactions to current medications since the last visit.  There are no new concerns. She is disturbed that she has not been able to lose weight despite sticking to a low cal diet. Her activity is limited by chronic back pain There are no specific complaints       Review of Systems See HPI Denies recent fever or chills. Denies sinus pressure, nasal congestion, ear pain or sore throat. Denies chest congestion, productive cough or wheezing. Denies chest pains, palpitations and leg swelling Denies abdominal pain, nausea, vomiting,diarrhea or constipation.   Denies dysuria, frequency, hesitancy or incontinence. Denies uncontrolled  joint pain, swelling and limitation in mobility. Denies headaches, seizures, numbness, or tingling. Denies depression, anxiety or insomnia. Denies skin break down or rash.        Objective:   Physical Exam  Patient alert and oriented and in no cardiopulmonary distress.  HEENT: No facial asymmetry, EOMI, no sinus tenderness,  oropharynx pink and moist.  Neck supple no adenopathy.  Chest: Clear to auscultation bilaterally.  CVS: S1, S2 no murmurs, no S3.  ABD: Soft non tender. Bowel sounds normal.  Ext: No edema  MS: Adequate though reduced ROM spine adequate in shoulders, hips and knees.  Skin: Intact, no ulcerations or rash noted.  Psych: Good eye contact, normal affect. Memory intact not anxious or depressed appearing.  CNS: CN 2-12 intact, power, tone and sensation  normal throughout.       Assessment & Plan:

## 2012-05-10 NOTE — Assessment & Plan Note (Signed)
Controlled, no change in medication  

## 2012-05-10 NOTE — Assessment & Plan Note (Signed)
Controlled, no change in medication DASH diet and commitment to daily physical activity for a minimum of 30 minutes discussed and encouraged, as a part of hypertension management. The importance of attaining a healthy weight is also discussed.  

## 2012-05-17 ENCOUNTER — Other Ambulatory Visit: Payer: Self-pay | Admitting: Family Medicine

## 2012-05-30 DIAGNOSIS — IMO0002 Reserved for concepts with insufficient information to code with codable children: Secondary | ICD-10-CM | POA: Diagnosis not present

## 2012-05-30 DIAGNOSIS — M549 Dorsalgia, unspecified: Secondary | ICD-10-CM | POA: Diagnosis not present

## 2012-08-07 ENCOUNTER — Other Ambulatory Visit: Payer: Self-pay

## 2012-08-07 MED ORDER — PHENTERMINE HCL 37.5 MG PO TABS: 37.5000 mg | ORAL_TABLET | Freq: Every day | ORAL | Status: DC

## 2012-09-11 ENCOUNTER — Ambulatory Visit: Payer: Medicare Other | Admitting: Family Medicine

## 2012-10-21 DIAGNOSIS — M549 Dorsalgia, unspecified: Secondary | ICD-10-CM | POA: Diagnosis not present

## 2012-10-21 DIAGNOSIS — IMO0002 Reserved for concepts with insufficient information to code with codable children: Secondary | ICD-10-CM | POA: Diagnosis not present

## 2012-10-21 DIAGNOSIS — I10 Essential (primary) hypertension: Secondary | ICD-10-CM | POA: Diagnosis not present

## 2012-11-12 ENCOUNTER — Other Ambulatory Visit: Payer: Self-pay | Admitting: Family Medicine

## 2012-11-12 DIAGNOSIS — I10 Essential (primary) hypertension: Secondary | ICD-10-CM | POA: Diagnosis not present

## 2012-11-12 DIAGNOSIS — E669 Obesity, unspecified: Secondary | ICD-10-CM | POA: Diagnosis not present

## 2012-11-12 DIAGNOSIS — M899 Disorder of bone, unspecified: Secondary | ICD-10-CM | POA: Diagnosis not present

## 2012-11-12 DIAGNOSIS — M949 Disorder of cartilage, unspecified: Secondary | ICD-10-CM | POA: Diagnosis not present

## 2012-11-12 DIAGNOSIS — R7301 Impaired fasting glucose: Secondary | ICD-10-CM | POA: Diagnosis not present

## 2012-11-12 DIAGNOSIS — R5383 Other fatigue: Secondary | ICD-10-CM | POA: Diagnosis not present

## 2012-11-12 LAB — CBC WITH DIFFERENTIAL/PLATELET
Basophils Absolute: 0 10*3/uL (ref 0.0–0.1)
Basophils Relative: 0 % (ref 0–1)
HCT: 34.4 % — ABNORMAL LOW (ref 36.0–46.0)
Hemoglobin: 11.6 g/dL — ABNORMAL LOW (ref 12.0–15.0)
Lymphocytes Relative: 40 % (ref 12–46)
Monocytes Absolute: 0.6 10*3/uL (ref 0.1–1.0)
Monocytes Relative: 6 % (ref 3–12)
Neutro Abs: 5.3 10*3/uL (ref 1.7–7.7)
Neutrophils Relative %: 52 % (ref 43–77)
RBC: 4.13 MIL/uL (ref 3.87–5.11)
WBC: 10.1 10*3/uL (ref 4.0–10.5)

## 2012-11-13 ENCOUNTER — Ambulatory Visit: Payer: Medicare Other | Admitting: Family Medicine

## 2012-11-13 LAB — COMPREHENSIVE METABOLIC PANEL
AST: 15 U/L (ref 0–37)
Albumin: 4.1 g/dL (ref 3.5–5.2)
Alkaline Phosphatase: 56 U/L (ref 39–117)
BUN: 14 mg/dL (ref 6–23)
Calcium: 9.3 mg/dL (ref 8.4–10.5)
Chloride: 102 mEq/L (ref 96–112)
Glucose, Bld: 83 mg/dL (ref 70–99)
Potassium: 3.9 mEq/L (ref 3.5–5.3)
Sodium: 138 mEq/L (ref 135–145)
Total Protein: 7.3 g/dL (ref 6.0–8.3)

## 2012-11-13 LAB — TSH: TSH: 2.684 u[IU]/mL (ref 0.350–4.500)

## 2012-11-13 LAB — HEMOGLOBIN A1C: Mean Plasma Glucose: 131 mg/dL — ABNORMAL HIGH (ref <117)

## 2012-11-13 LAB — VITAMIN D 25 HYDROXY (VIT D DEFICIENCY, FRACTURES): Vit D, 25-Hydroxy: 19 ng/mL — ABNORMAL LOW (ref 30–89)

## 2012-11-13 LAB — LIPID PANEL
HDL: 55 mg/dL (ref >39)
LDL Cholesterol: 136 mg/dL — ABNORMAL HIGH (ref 0–99)

## 2012-11-18 ENCOUNTER — Other Ambulatory Visit: Payer: Self-pay | Admitting: Family Medicine

## 2012-11-18 DIAGNOSIS — Z139 Encounter for screening, unspecified: Secondary | ICD-10-CM

## 2012-11-20 ENCOUNTER — Ambulatory Visit (INDEPENDENT_AMBULATORY_CARE_PROVIDER_SITE_OTHER): Payer: Medicare Other | Admitting: Family Medicine

## 2012-11-20 ENCOUNTER — Encounter: Payer: Self-pay | Admitting: Family Medicine

## 2012-11-20 VITALS — BP 160/100 | HR 93 | Resp 18 | Ht 63.0 in | Wt 219.0 lb

## 2012-11-20 DIAGNOSIS — E669 Obesity, unspecified: Secondary | ICD-10-CM | POA: Diagnosis not present

## 2012-11-20 DIAGNOSIS — I10 Essential (primary) hypertension: Secondary | ICD-10-CM | POA: Diagnosis not present

## 2012-11-20 DIAGNOSIS — J309 Allergic rhinitis, unspecified: Secondary | ICD-10-CM

## 2012-11-20 DIAGNOSIS — M545 Low back pain: Secondary | ICD-10-CM

## 2012-11-20 DIAGNOSIS — E785 Hyperlipidemia, unspecified: Secondary | ICD-10-CM

## 2012-11-20 DIAGNOSIS — R7303 Prediabetes: Secondary | ICD-10-CM

## 2012-11-20 DIAGNOSIS — R7309 Other abnormal glucose: Secondary | ICD-10-CM

## 2012-11-20 MED ORDER — FLUTICASONE PROPIONATE 50 MCG/ACT NA SUSP: 2.0000 | Freq: Every day | NASAL | Status: DC

## 2012-11-20 MED ORDER — AMLODIPINE BESYLATE 10 MG PO TABS: 10.0000 mg | ORAL_TABLET | Freq: Every day | ORAL | Status: DC

## 2012-11-20 MED ORDER — CLONIDINE HCL 0.2 MG PO TABS: ORAL_TABLET | ORAL | Status: DC

## 2012-11-20 MED ORDER — PHENTERMINE HCL 37.5 MG PO TABS: 37.5000 mg | ORAL_TABLET | Freq: Every day | ORAL | Status: DC

## 2012-11-20 MED ORDER — TRIAMTERENE-HCTZ 75-50 MG PO TABS: ORAL_TABLET | ORAL | Status: DC

## 2012-11-20 NOTE — Assessment & Plan Note (Signed)
Unchanged, treated with cymbalta by neurology

## 2012-11-20 NOTE — Patient Instructions (Addendum)
F/u in early May  Blood pressure is too high, new additional medication for blood pressure.  Increase fresh fruit and vegetable, reduce salt and canned foods  It is important that you exercise regularly at least 30 minutes 7 times a week. If you develop chest pain, have severe difficulty breathing, or feel very tired, stop exercising immediately and seek medical attention   A healthy diet is rich in fruit, vegetables and whole grains. Poultry fish, nuts and beans are a healthy choice for protein rather then red meat. A low sodium diet and drinking 64 ounces of water daily is generally recommended. Oils and sweet should be limited. Carbohydrates especially for those who are diabetic or overweight, should be limited to 30-45 gram per meal. It is important to eat on a regular schedule, at least 3 times daily. Snacks should be primarily fruits, vegetables or nuts.  Please attend a diabetic class which is free to remind yourself of portion control and healthy food choice   Weight loss goal of 6 pounds

## 2012-11-21 ENCOUNTER — Ambulatory Visit (HOSPITAL_COMMUNITY)
Admission: RE | Admit: 2012-11-21 | Discharge: 2012-11-21 | Disposition: A | Discharge status: Home / Self Care | Payer: Medicare Other | Source: Ambulatory Visit | Attending: Family Medicine | Admitting: Family Medicine

## 2012-11-21 DIAGNOSIS — Z139 Encounter for screening, unspecified: Secondary | ICD-10-CM

## 2012-11-21 DIAGNOSIS — Z1231 Encounter for screening mammogram for malignant neoplasm of breast: Secondary | ICD-10-CM | POA: Insufficient documentation

## 2012-11-24 DIAGNOSIS — E7800 Pure hypercholesterolemia, unspecified: Secondary | ICD-10-CM | POA: Insufficient documentation

## 2012-11-24 DIAGNOSIS — E669 Obesity, unspecified: Secondary | ICD-10-CM | POA: Insufficient documentation

## 2012-11-24 DIAGNOSIS — R7303 Prediabetes: Secondary | ICD-10-CM | POA: Insufficient documentation

## 2012-11-24 DIAGNOSIS — E785 Hyperlipidemia, unspecified: Secondary | ICD-10-CM | POA: Insufficient documentation

## 2012-11-24 NOTE — Assessment & Plan Note (Signed)
Deteriorated Hyperlipidemia:Low fat diet discussed and encouraged.   

## 2012-11-24 NOTE — Assessment & Plan Note (Signed)
Deteriorated. Patient re-educated about  the importance of commitment to a  minimum of 150 minutes of exercise per week. The importance of healthy food choices with portion control discussed. Encouraged to start a food diary, count calories and to consider  joining a support group. Sample diet sheets offered. Goals set by the patient for the next several months.    

## 2012-11-24 NOTE — Assessment & Plan Note (Signed)
Uncontrolled, add clonidine DASH diet and commitment to daily physical activity for a minimum of 30 minutes discussed and encouraged, as a part of hypertension management. The importance of attaining a healthy weight is also discussed.

## 2012-11-24 NOTE — Progress Notes (Signed)
  Subjective:    Patient ID: Brittany Maxwell, female    DOB: 01-23-1966, 47 y.o.   MRN: [id number]  HPI The PT is here for follow up and re-evaluation of chronic medical conditions, medication management and review of any available recent lab and radiology data.  Preventive health is updated, specifically  Cancer screening and Immunization.   Questions or concerns regarding consultations or procedures which the PT has had in the interim are  addressed. The PT denies any adverse reactions to current medications since the last visit.  There are no new concerns. She has gained weight since stopping phentermine, and wishes to resume thtis There are no specific complaints       Review of Systems See HPI Denies recent fever or chills. Denies sinus pressure, nasal congestion, ear pain or sore throat. Denies chest congestion, productive cough or wheezing. Denies chest pains, palpitations and leg swelling Denies abdominal pain, nausea, vomiting,diarrhea or constipation.   Denies dysuria, frequency, hesitancy or incontinence. Chronic back pain with limitation in mobility. Denies headaches, seizures, numbness, or tingling. Denies uncontrolled  depression, anxiety or insomnia. Denies skin break down or rash.        Objective:   Physical Exam Patient alert and oriented and in no cardiopulmonary distress.  HEENT: No facial asymmetry, EOMI, no sinus tenderness,  oropharynx pink and moist.  Neck supple no adenopathy.  Chest: Clear to auscultation bilaterally.  CVS: S1, S2 no murmurs, no S3.  ABD: Soft non tender. Bowel sounds normal.  Ext: No edema  MS: decreased  ROM spine, adequate in shoulders, hips and knees.  Skin: Intact, no ulcerations or rash noted.  Psych: Good eye contact, normal affect. Memory intact not anxious or depressed appearing.  CNS: CN 2-12 intact, power, tone and sensation normal throughout.        Assessment & Plan:

## 2012-11-24 NOTE — Assessment & Plan Note (Signed)
Patient educated about the importance of limiting  Carbohydrate intake , the need to commit to daily physical activity for a minimum of 30 minutes , and to commit weight loss. The fact that changes in all these areas will reduce or eliminate all together the development of diabetes is stressed.   unchnaged

## 2013-01-07 ENCOUNTER — Ambulatory Visit (INDEPENDENT_AMBULATORY_CARE_PROVIDER_SITE_OTHER): Payer: Medicare Other | Admitting: Family Medicine

## 2013-01-07 ENCOUNTER — Encounter: Payer: Self-pay | Admitting: Family Medicine

## 2013-01-07 VITALS — BP 130/82 | HR 82 | Resp 18 | Ht 63.0 in | Wt 217.0 lb

## 2013-01-07 DIAGNOSIS — J0100 Acute maxillary sinusitis, unspecified: Secondary | ICD-10-CM

## 2013-01-07 MED ORDER — AMOXICILLIN 500 MG PO CAPS: 500.0000 mg | ORAL_CAPSULE | Freq: Three times a day (TID) | ORAL | Status: DC

## 2013-01-07 MED ORDER — HYDROCODONE-ACETAMINOPHEN 5-325 MG PO TABS: 1.0000 | ORAL_TABLET | Freq: Four times a day (QID) | ORAL | Status: DC | PRN

## 2013-01-07 NOTE — Patient Instructions (Signed)
Start antibiotics Warm compresses  Vicodin for pain Try Afrin and then follow with the flonase if you nose opens up Call if you do not improve F/U as previous with Dr.Simpson

## 2013-01-08 DIAGNOSIS — J0100 Acute maxillary sinusitis, unspecified: Secondary | ICD-10-CM | POA: Insufficient documentation

## 2013-01-08 DIAGNOSIS — J019 Acute sinusitis, unspecified: Secondary | ICD-10-CM | POA: Insufficient documentation

## 2013-01-08 NOTE — Assessment & Plan Note (Signed)
She has a lot of inflammation in the nares and left maxillary sinusitis, ? Blocked sinus , will start her on compresses to face, antibiotics, will have her use Afrin followed by the flonase to help reduce the inflammation If she does not improve by early next week, will set up with ENT Vicodin for pain

## 2013-01-08 NOTE — Progress Notes (Signed)
  Subjective:    Patient ID: Brittany Maxwell, female    DOB: 03/27/1966, 47 y.o.   MRN: [id number]  HPI  Face pain and sinus pressure for the past 4-5 weeks, initially started as congestion given flonase by PCP which helped some, then noticed hardening and swelling of face, felt fever in face, Has used sudafed which helped some.  Review of Systems  GEN- denies fatigue, fever, weight loss,weakness, recent illness HEENT- denies eye drainage, change in vision,+ nasal discharge, CVS- denies chest pain, palpitations RESP- denies SOB, cough, wheeze Neuro- denies headache, dizziness, syncope, seizure activity       Objective:   Physical Exam  GEN- NAD, alert and oriented x3 HEENT- PERRL, EOMI, non injected sclera, pink conjunctiva, MMM, oropharynx clear, TM clear bilat no effusion, + Left maxillary sinus tenderness, inflammed turbinates,  +Nasal drainage , + indurated area to left side of nares extending 1cm to cheek, no abscess tooth Neck- Supple, no LAD CVS- RRR, no murmur RESP-CTAB Pulses- Radial 2+         Assessment & Plan:

## 2013-02-11 ENCOUNTER — Ambulatory Visit: Payer: Medicare Other | Admitting: Family Medicine

## 2013-02-19 ENCOUNTER — Ambulatory Visit: Payer: PRIVATE HEALTH INSURANCE | Admitting: Family Medicine

## 2013-02-26 ENCOUNTER — Ambulatory Visit (INDEPENDENT_AMBULATORY_CARE_PROVIDER_SITE_OTHER): Payer: PRIVATE HEALTH INSURANCE | Admitting: Family Medicine

## 2013-02-26 ENCOUNTER — Encounter: Payer: Self-pay | Admitting: Family Medicine

## 2013-02-26 VITALS — BP 126/80 | HR 80 | Resp 18 | Ht 63.0 in | Wt 218.1 lb

## 2013-02-26 DIAGNOSIS — E785 Hyperlipidemia, unspecified: Secondary | ICD-10-CM

## 2013-02-26 DIAGNOSIS — I10 Essential (primary) hypertension: Secondary | ICD-10-CM | POA: Diagnosis not present

## 2013-02-26 DIAGNOSIS — M549 Dorsalgia, unspecified: Secondary | ICD-10-CM | POA: Diagnosis not present

## 2013-02-26 DIAGNOSIS — M545 Low back pain: Secondary | ICD-10-CM

## 2013-02-26 DIAGNOSIS — R7309 Other abnormal glucose: Secondary | ICD-10-CM

## 2013-02-26 DIAGNOSIS — E669 Obesity, unspecified: Secondary | ICD-10-CM

## 2013-02-26 DIAGNOSIS — IMO0002 Reserved for concepts with insufficient information to code with codable children: Secondary | ICD-10-CM | POA: Diagnosis not present

## 2013-02-26 DIAGNOSIS — R7303 Prediabetes: Secondary | ICD-10-CM

## 2013-02-26 MED ORDER — AMLODIPINE BESYLATE 10 MG PO TABS: 10.0000 mg | ORAL_TABLET | Freq: Every day | ORAL | Status: DC

## 2013-02-26 MED ORDER — PHENTERMINE HCL 37.5 MG PO TABS: 37.5000 mg | ORAL_TABLET | Freq: Every day | ORAL | Status: DC

## 2013-02-26 MED ORDER — METFORMIN HCL 500 MG PO TABS: 500.0000 mg | ORAL_TABLET | Freq: Every day | ORAL | Status: DC

## 2013-02-26 NOTE — Progress Notes (Signed)
  Subjective:    Patient ID: Brittany Maxwell, female    DOB: 03-20-1966, 47 y.o.   MRN: [id number]  HPI The PT is here for follow up and re-evaluation of chronic medical conditions, medication management and review of any available recent lab and radiology data.  Preventive health is updated, specifically  Cancer screening and Immunization.   Questions or concerns regarding consultations or procedures which the PT has had in the interim are  addressed. The PT denies any adverse reactions to current medications since the last visit.  There are no new concerns.  Concerned with weight gain , wants help with this      Review of Systems See HPI Denies recent fever or chills. Denies sinus pressure, nasal congestion, ear pain or sore throat. Denies chest congestion, productive cough or wheezing. Denies chest pains, palpitations and leg swelling Denies abdominal pain, nausea, vomiting,diarrhea or constipation.   Denies dysuria, frequency, hesitancy or incontinence. Chronic  joint pain, swelling and limitation in mobility. Denies headaches, seizures, numbness, or tingling. Denies depression, anxiety or insomnia. Denies skin break down or rash.        Objective:   Physical Exam Patient alert and oriented and in no cardiopulmonary distress.  HEENT: No facial asymmetry, EOMI, no sinus tenderness,  oropharynx pink and moist.  Neck supple no adenopathy.  Chest: Clear to auscultation bilaterally.  CVS: S1, S2 no murmurs, no S3.  ABD: Soft non tender. Bowel sounds normal.  Ext: No edema  MS: Adequate ROM spine, shoulders, hips and knees.  Skin: Intact, no ulcerations or rash noted.  Psych: Good eye contact, normal affect. Memory intact not anxious or depressed appearing.  CNS: CN 2-12 intact, power, tone and sensation normal throughout.        Assessment & Plan:

## 2013-02-26 NOTE — Patient Instructions (Addendum)
F/u in 4 month  hBA1C today   Fasting lipid, chem 7 and HBa1C   You need to limit calories to 1500 calories per day  Please try to commit to walking /exercise at least 5 days per week  Weight loss goal of 6 to 8 pounds  Blood pressure is excellent.  Take phentermine half tablet once daily

## 2013-03-03 ENCOUNTER — Telehealth: Payer: Self-pay | Admitting: Family Medicine

## 2013-03-03 NOTE — Telephone Encounter (Signed)
Pls let pt know since she is diabetic and also because her cholesterol is high (checked in [facility])m I recoommend low fat diet AND a statin, pravachol is entered, pls send in after you speak with her.  Will need rept lipid and hepatic in 6 month

## 2013-03-03 NOTE — Assessment & Plan Note (Signed)
Controlled, no change in medication DASH diet and commitment to daily physical activity for a minimum of 30 minutes discussed and encouraged, as a part of hypertension management. The importance of attaining a healthy weight is also discussed.  

## 2013-03-03 NOTE — Assessment & Plan Note (Signed)
Unchanged, on disability due to spinal problems  Pain management through neurology

## 2013-03-03 NOTE — Assessment & Plan Note (Signed)
Deteriorated. Increase dose of metformin, and commitment to weight loss and exercise Patient advised to reduce carb and sweets, commit to regular physical activity, take meds as prescribed, test blood as directed, and attempt to lose weight, to improve blood sugar control.

## 2013-03-03 NOTE — Assessment & Plan Note (Addendum)
Hyperlipidemia:Low fat diet discussed and encouraged.  Uncontrolled  With dx of diabetes, also neds statin will contact pt regarding this, start pravachol

## 2013-03-04 ENCOUNTER — Telehealth (HOSPITAL_COMMUNITY): Payer: Self-pay | Admitting: Dietician

## 2013-03-04 ENCOUNTER — Other Ambulatory Visit: Payer: Self-pay

## 2013-03-04 DIAGNOSIS — R7303 Prediabetes: Secondary | ICD-10-CM

## 2013-03-04 MED ORDER — METFORMIN HCL 500 MG PO TABS: 500.0000 mg | ORAL_TABLET | Freq: Three times a day (TID) | ORAL | Status: DC

## 2013-03-04 NOTE — Telephone Encounter (Signed)
Received voicemail from pt left on 1106. Pt with newly diagnosed DM. Requests to attend DM class. Called back at 1350. Pt scheduled for DM class on 03/18/13 at 1000.

## 2013-03-09 NOTE — Assessment & Plan Note (Signed)
Deteriorated. Patient re-educated about  the importance of commitment to a  minimum of 150 minutes of exercise per week. The importance of healthy food choices with portion control discussed. Encouraged to start a food diary, count calories and to consider  joining a support group. Sample diet sheets offered. Goals set by the patient for the next several months.    

## 2013-03-18 ENCOUNTER — Telehealth (HOSPITAL_COMMUNITY): Payer: Self-pay | Admitting: Dietician

## 2013-03-18 NOTE — Telephone Encounter (Signed)
Received voicemail from pt at [phone]. She reports she is unable to attend diabetes class today and desires reschedule. Called pt back at 0945. Rescheduled for class on 04/01/13.

## 2013-04-01 ENCOUNTER — Telehealth (HOSPITAL_COMMUNITY): Payer: Self-pay | Admitting: Dietician

## 2013-04-01 NOTE — Telephone Encounter (Signed)
Noted  

## 2013-04-01 NOTE — Telephone Encounter (Signed)
Pt was a no-show for group diabetes class on 04/01/2013 at 1000.

## 2013-04-10 ENCOUNTER — Other Ambulatory Visit: Payer: Self-pay

## 2013-06-24 ENCOUNTER — Ambulatory Visit: Payer: PRIVATE HEALTH INSURANCE | Admitting: Family Medicine

## 2013-07-01 ENCOUNTER — Encounter: Payer: Self-pay | Admitting: Family Medicine

## 2013-07-01 ENCOUNTER — Ambulatory Visit (INDEPENDENT_AMBULATORY_CARE_PROVIDER_SITE_OTHER): Payer: PRIVATE HEALTH INSURANCE | Admitting: Family Medicine

## 2013-07-01 VITALS — BP 140/92 | HR 89 | Resp 16 | Ht 63.0 in | Wt 203.1 lb

## 2013-07-01 DIAGNOSIS — E785 Hyperlipidemia, unspecified: Secondary | ICD-10-CM

## 2013-07-01 DIAGNOSIS — E1169 Type 2 diabetes mellitus with other specified complication: Secondary | ICD-10-CM

## 2013-07-01 DIAGNOSIS — I10 Essential (primary) hypertension: Secondary | ICD-10-CM | POA: Diagnosis not present

## 2013-07-01 DIAGNOSIS — E559 Vitamin D deficiency, unspecified: Secondary | ICD-10-CM | POA: Insufficient documentation

## 2013-07-01 DIAGNOSIS — Z23 Encounter for immunization: Secondary | ICD-10-CM | POA: Diagnosis not present

## 2013-07-01 DIAGNOSIS — E669 Obesity, unspecified: Secondary | ICD-10-CM

## 2013-07-01 DIAGNOSIS — M545 Low back pain: Secondary | ICD-10-CM

## 2013-07-01 DIAGNOSIS — Z1211 Encounter for screening for malignant neoplasm of colon: Secondary | ICD-10-CM

## 2013-07-01 DIAGNOSIS — E119 Type 2 diabetes mellitus without complications: Secondary | ICD-10-CM

## 2013-07-01 LAB — COMPLETE METABOLIC PANEL WITH GFR
ALT: 13 U/L (ref 0–35)
AST: 13 U/L (ref 0–37)
Alkaline Phosphatase: 63 U/L (ref 39–117)
Creat: 1.01 mg/dL (ref 0.50–1.10)
Glucose, Bld: 86 mg/dL (ref 70–99)
Sodium: 139 mEq/L (ref 135–145)
Total Bilirubin: 0.5 mg/dL (ref 0.3–1.2)
Total Protein: 7.3 g/dL (ref 6.0–8.3)

## 2013-07-01 LAB — HEMOGLOBIN A1C
Hgb A1c MFr Bld: 6.4 % — ABNORMAL HIGH (ref <5.7)
Mean Plasma Glucose: 137 mg/dL — ABNORMAL HIGH (ref <117)

## 2013-07-01 MED ORDER — AMLODIPINE BESYLATE 10 MG PO TABS: 10.0000 mg | ORAL_TABLET | Freq: Every day | ORAL | Status: DC

## 2013-07-01 MED ORDER — PRAVASTATIN SODIUM 20 MG PO TABS: 20.0000 mg | ORAL_TABLET | Freq: Every day | ORAL | Status: DC

## 2013-07-01 MED ORDER — BENAZEPRIL HCL 10 MG PO TABS: 10.0000 mg | ORAL_TABLET | Freq: Every day | ORAL | Status: AC

## 2013-07-01 MED ORDER — ERGOCALCIFEROL 1.25 MG (50000 UT) PO CAPS: 50000.0000 [IU] | ORAL_CAPSULE | ORAL | Status: DC

## 2013-07-01 NOTE — Assessment & Plan Note (Signed)
Improved. Pt applauded on succesful weight loss through lifestyle change, and encouraged to continue same. Weight loss goal set for the next several months.  

## 2013-07-01 NOTE — Assessment & Plan Note (Signed)
Improved with commitment to regular activity and weight loss She will continue to follow with neurology for pain management

## 2013-07-01 NOTE — Assessment & Plan Note (Signed)
Uncontrolled, will change to ACE for renal protection DASH diet and commitment to daily physical activity for a minimum of 30 minutes discussed and encouraged, as a part of hypertension management. The importance of attaining a healthy weight is also discussed.

## 2013-07-01 NOTE — Assessment & Plan Note (Signed)
Pt to take 6 month course of  Weekly vit D

## 2013-07-01 NOTE — Assessment & Plan Note (Signed)
Hyperlipidemia:Low fat diet discussed and encouraged.  Did not fill pravachol prescribed several months ago, she is to start taking this today

## 2013-07-01 NOTE — Patient Instructions (Addendum)
F/u in 4 month, please call if you need me before  Congrats on excellent weight loss, and being able to commit to daily walking, please keep it up, you may be able to cure yourself of diabetes  HBA1C , chem 7 and EGFR today  Microalb from office today.  Please log into "my chart before you leave today, so communication with the office will be easier and more direct. I will send you a result note on yopur labs tomorrow and ask that you respond   Fasting lipid, cmp and eGFR, hBA1C in 4 month, before visit   Flu and pneumonia vaccines today  NEW MEDICATIONS  STOP CLONIDINE, NO MORE rEFILLS  New for blood pressure is benazepril 10mg  one daily, this will help to protect your kidneys as a diabetic  New for cholesterol is pravastatin one at bedtime, alos cut back on fried and fatty foods and red meat  New for vit D is once weekly vitamin D for total of 6 months (3 months with 1 refill)  You are referred to dr Nile Riggs for diabetic eye exam

## 2013-07-01 NOTE — Assessment & Plan Note (Signed)
Excellent weight loss and lifestyle modification Reports fasting sugars under 110 most of the time , will see hBa1C today

## 2013-07-01 NOTE — Progress Notes (Signed)
  Subjective:    Patient ID: STARLINA LAPRE, female    DOB: 12-Aug-1966, 47 y.o.   MRN: [id number]  HPI The PT is here for follow up and re-evaluation of chronic medical conditions, medication management and review of any available recent lab and radiology data.  Preventive health is updated, specifically  Cancer screening and Immunization.   Questions or concerns regarding consultations or procedures which the PT has had in the interim are  addressed. The PT denies any adverse reactions to current medications since the last visit. Unfortunately did not understand the need to fill pravachol Has worked hard on lifestyle modification with excellent weight loss and reported great  FBG values Back pain less with weight loss and more activity Mentally continues to improve through faith , despite marital problems      Review of Systems See HPI Denies recent fever or chills. Denies sinus pressure, nasal congestion, ear pain or sore throat. Denies chest congestion, productive cough or wheezing. Denies chest pains, palpitations and leg swelling Denies abdominal pain, nausea, vomiting,diarrhea or constipation.   Denies dysuria, frequency, hesitancy or incontinence. Denies uncontrolled  joint pain, swelling and limitation in mobility. Denies headaches, seizures, numbness, or tingling. Denies depression, anxiety or insomnia. Denies skin break down or rash.        Objective:   Physical Exam Patient alert and oriented and in no cardiopulmonary distress.  HEENT: No facial asymmetry, EOMI, no sinus tenderness,  oropharynx pink and moist.  Neck supple no adenopathy.  Chest: Clear to auscultation bilaterally.  CVS: S1, S2 no murmurs, no S3.  ABD: Soft non tender. Bowel sounds normal. Rectal: no mass , heme  Negative stool Ext: No edema  MS: Adequate ROM spine, shoulders, hips and knees.  Skin: Intact, no ulcerations or rash noted.  Psych: Good eye contact, normal affect. Memory  intact not anxious or depressed appearing.  CNS: CN 2-12 intact, power, tone and sensation normal throughout.        Assessment & Plan:

## 2013-07-02 ENCOUNTER — Encounter: Payer: Self-pay | Admitting: Family Medicine

## 2013-07-02 LAB — MICROALBUMIN / CREATININE URINE RATIO
Creatinine, Urine: 264.8 mg/dL
Microalb Creat Ratio: 5.3 mg/g (ref 0.0–30.0)

## 2013-07-02 NOTE — Addendum Note (Signed)
Addended by: Abner Greenspan on: 07/02/2013 08:30 AM   Modules accepted: Orders

## 2013-08-05 DIAGNOSIS — E119 Type 2 diabetes mellitus without complications: Secondary | ICD-10-CM | POA: Diagnosis not present

## 2013-08-25 ENCOUNTER — Other Ambulatory Visit: Payer: Self-pay | Admitting: Family Medicine

## 2013-10-28 ENCOUNTER — Ambulatory Visit (INDEPENDENT_AMBULATORY_CARE_PROVIDER_SITE_OTHER): Payer: PRIVATE HEALTH INSURANCE | Admitting: Family Medicine

## 2013-10-28 ENCOUNTER — Encounter: Payer: Self-pay | Admitting: Family Medicine

## 2013-10-28 VITALS — BP 114/74 | HR 77 | Resp 16 | Ht 63.0 in | Wt 204.0 lb

## 2013-10-28 DIAGNOSIS — F329 Major depressive disorder, single episode, unspecified: Secondary | ICD-10-CM | POA: Diagnosis not present

## 2013-10-28 DIAGNOSIS — F32A Depression, unspecified: Secondary | ICD-10-CM

## 2013-10-28 DIAGNOSIS — E8881 Metabolic syndrome: Secondary | ICD-10-CM

## 2013-10-28 DIAGNOSIS — R5383 Other fatigue: Secondary | ICD-10-CM

## 2013-10-28 DIAGNOSIS — R5381 Other malaise: Secondary | ICD-10-CM | POA: Diagnosis not present

## 2013-10-28 DIAGNOSIS — I10 Essential (primary) hypertension: Secondary | ICD-10-CM | POA: Diagnosis not present

## 2013-10-28 DIAGNOSIS — F3289 Other specified depressive episodes: Secondary | ICD-10-CM

## 2013-10-28 DIAGNOSIS — E669 Obesity, unspecified: Secondary | ICD-10-CM

## 2013-10-28 DIAGNOSIS — N62 Hypertrophy of breast: Secondary | ICD-10-CM | POA: Diagnosis not present

## 2013-10-28 DIAGNOSIS — M545 Low back pain, unspecified: Secondary | ICD-10-CM

## 2013-10-28 DIAGNOSIS — E559 Vitamin D deficiency, unspecified: Secondary | ICD-10-CM

## 2013-10-28 DIAGNOSIS — E785 Hyperlipidemia, unspecified: Secondary | ICD-10-CM

## 2013-10-28 DIAGNOSIS — E119 Type 2 diabetes mellitus without complications: Secondary | ICD-10-CM

## 2013-10-28 DIAGNOSIS — E1169 Type 2 diabetes mellitus with other specified complication: Secondary | ICD-10-CM

## 2013-10-28 LAB — LIPID PANEL
CHOLESTEROL: 116 mg/dL (ref 0–200)
HDL: 41 mg/dL (ref >39)
LDL Cholesterol: 55 mg/dL (ref 0–99)
TRIGLYCERIDES: 100 mg/dL (ref <150)
Total CHOL/HDL Ratio: 2.8 Ratio
VLDL: 20 mg/dL (ref 0–40)

## 2013-10-28 LAB — COMPLETE METABOLIC PANEL WITH GFR
ALT: 16 U/L (ref 0–35)
AST: 15 U/L (ref 0–37)
Albumin: 4 g/dL (ref 3.5–5.2)
Alkaline Phosphatase: 53 U/L (ref 39–117)
BILIRUBIN TOTAL: 0.5 mg/dL (ref 0.3–1.2)
BUN: 15 mg/dL (ref 6–23)
CALCIUM: 9.5 mg/dL (ref 8.4–10.5)
CHLORIDE: 99 meq/L (ref 96–112)
CO2: 31 meq/L (ref 19–32)
CREATININE: 1.11 mg/dL — AB (ref 0.50–1.10)
GFR, EST NON AFRICAN AMERICAN: 59 mL/min — AB
GFR, Est African American: 68 mL/min
GLUCOSE: 77 mg/dL (ref 70–99)
Potassium: 3.5 mEq/L (ref 3.5–5.3)
Sodium: 140 mEq/L (ref 135–145)
Total Protein: 7.1 g/dL (ref 6.0–8.3)

## 2013-10-28 LAB — CBC WITH DIFFERENTIAL/PLATELET
BASOS PCT: 0 % (ref 0–1)
Basophils Absolute: 0 10*3/uL (ref 0.0–0.1)
EOS PCT: 1 % (ref 0–5)
Eosinophils Absolute: 0.1 10*3/uL (ref 0.0–0.7)
HEMATOCRIT: 34.6 % — AB (ref 36.0–46.0)
Hemoglobin: 11.8 g/dL — ABNORMAL LOW (ref 12.0–15.0)
LYMPHS PCT: 36 % (ref 12–46)
Lymphs Abs: 4.3 10*3/uL — ABNORMAL HIGH (ref 0.7–4.0)
MCH: 29.4 pg (ref 26.0–34.0)
MCHC: 34.1 g/dL (ref 30.0–36.0)
MCV: 86.1 fL (ref 78.0–100.0)
MONO ABS: 0.7 10*3/uL (ref 0.1–1.0)
Monocytes Relative: 6 % (ref 3–12)
NEUTROS ABS: 7 10*3/uL (ref 1.7–7.7)
Neutrophils Relative %: 57 % (ref 43–77)
PLATELETS: 441 10*3/uL — AB (ref 150–400)
RBC: 4.02 MIL/uL (ref 3.87–5.11)
RDW: 14 % (ref 11.5–15.5)
WBC: 12 10*3/uL — AB (ref 4.0–10.5)

## 2013-10-28 LAB — TSH: TSH: 1.671 u[IU]/mL (ref 0.350–4.500)

## 2013-10-28 LAB — HEMOGLOBIN A1C
Hgb A1c MFr Bld: 6 % — ABNORMAL HIGH (ref <5.7)
MEAN PLASMA GLUCOSE: 126 mg/dL — AB (ref <117)

## 2013-10-28 MED ORDER — FLUOXETINE HCL (PMDD) 10 MG PO CAPS: 1.0000 | ORAL_CAPSULE | Freq: Every day | ORAL | Status: DC

## 2013-10-28 NOTE — Patient Instructions (Signed)
F/u in end April, call if you need me before  New medication for depression and you are also referred for therapy.  Consider volunteer twice monthly this will help  You are referred to plastics for breast reduction surgery  Excellent blood pressure today  Labs today lipid, cmp and EGFR, hBA1C, CBC, TSH and vit D

## 2013-10-28 NOTE — Progress Notes (Signed)
Subjective:    Patient ID: Brittany Maxwell, female    DOB: [birth year], 48 y.o.   MRN: [id number]  HPI  The PT is here for follow up and re-evaluation of chronic medical conditions, medication management and review of any available recent lab and radiology data.  Preventive health is updated, specifically  Cancer screening and Immunization.   Questions or concerns regarding consultations or procedures which the PT has had in the interim are  addressed. The PT denies any adverse reactions to current medications since the last visit.  C/o lack of weight loss as far as bra size is concerned, continued disabling back pain wears a 40 DD with size 14 pants Back pain rated at a 7, mainly in low back but extends up to thoracic spine, also has indentation ion shoulders where bra rests wants evaluation for breast reduction surgery Brittany Maxwell puts the "best face forward" but today she breaks down in tears, and her depression screen is a 17, she is not a threat to anyone, just extremely sad and depressed, her marriage is not good and she is disabled at a young age     Review of Systems See HPI Denies recent fever or chills. Denies sinus pressure, nasal congestion, ear pain or sore throat. Denies chest congestion, productive cough or wheezing. Denies chest pains, palpitations and leg swelling Denies abdominal pain, nausea, vomiting,diarrhea or constipation.   Denies dysuria, frequency, hesitancy or incontinence. Chronic back pain  and limitation in mobility. Denies headaches, seizures, numbness, or tingling. Denies skin break down or rash.        Objective:   Physical Exam  BP 114/74  Pulse 77  Resp 16  Ht 5\' 3"  (1.6 m)  Wt 204 lb (92.534 kg)  BMI 36.15 kg/m2  SpO2 98% Patient alert and oriented and in no cardiopulmonary distress.  HEENT: No facial asymmetry, EOMI, no sinus tenderness,  oropharynx pink and moist.  Neck supple no adenopathy.  Chest: Clear to auscultation  bilaterally.  CVS: S1, S2 no murmurs, no S3.  ABD: Soft non tender. .  Ext: No edema  JX:[id number]:decreased ROM spine,adequate in  shoulders, hips and knees.  Skin: Intact, no ulcerations or rash noted.  Psych: Good eye contact, normal affect. Memory intact tearful, anxious and  depressed appearing.  CNS: CN 2-12 intact, power, normal throughout.       Assessment & Plan:  HYPERTENSION Controlled, no change in medication DASH diet and commitment to daily physical activity for a minimum of 30 minutes discussed and encouraged, as a part of hypertension management. The importance of attaining a healthy weight is also discussed.   Diabetes mellitus type 2 in obese Controlled, no change in medication Patient advised to reduce carb and sweets, commit to regular physical activity, take meds as prescribed, test blood as directed, and attempt to lose weight, to improve blood sugar control.   Hyperlipidemia LDL goal < 100 Controlled, no change in medication Hyperlipidemia:Low fat diet discussed and encouraged.    OBESITY Unchnaged. Patient re-educated about  the importance of commitment to a  minimum of 150 minutes of exercise per week. The importance of healthy food choices with portion control discussed. Encouraged to start a food diary, count calories and to consider  joining a support group. Sample diet sheets offered. Goals set by the patient for the next several months.     Metabolic syndrome X The increased risk of cardiovascular disease associated with this diagnosis, and the need to consistently work on lifestyle  to change this is discussed. Following  a  heart healthy diet ,commitment to 30 minutes of exercise at least 5 days per week, as well as control of blood sugar and cholesterol , and achieving a healthy weight are all the areas to be addressed .   Macromastia referred to [plastic surgery  Depression Severe and uncontrolled/ untreated . Start prozac and refer to  therapy. Pt not suicidal or homicidal  LOW BACK PAIN Maintained on cymbalta by neurology

## 2013-10-29 ENCOUNTER — Other Ambulatory Visit: Payer: Self-pay | Admitting: Family Medicine

## 2013-10-29 LAB — VITAMIN D 25 HYDROXY (VIT D DEFICIENCY, FRACTURES): VIT D 25 HYDROXY: 48 ng/mL (ref 30–89)

## 2013-11-08 DIAGNOSIS — E8881 Metabolic syndrome: Secondary | ICD-10-CM | POA: Insufficient documentation

## 2013-11-08 NOTE — Assessment & Plan Note (Signed)
The increased risk of cardiovascular disease associated with this diagnosis, and the need to consistently work on lifestyle to change this is discussed. Following  a  heart healthy diet ,commitment to 30 minutes of exercise at least 5 days per week, as well as control of blood sugar and cholesterol , and achieving a healthy weight are all the areas to be addressed .  

## 2013-11-08 NOTE — Assessment & Plan Note (Signed)
-   referred to plastic surgery

## 2013-11-08 NOTE — Assessment & Plan Note (Signed)
Controlled, no change in medication DASH diet and commitment to daily physical activity for a minimum of 30 minutes discussed and encouraged, as a part of hypertension management. The importance of attaining a healthy weight is also discussed.  

## 2013-11-08 NOTE — Assessment & Plan Note (Signed)
Controlled, no change in medication Hyperlipidemia:Low fat diet discussed and encouraged.  \ 

## 2013-11-08 NOTE — Assessment & Plan Note (Signed)
Severe and uncontrolled/ untreated . Start prozac and refer to therapy. Pt not suicidal or homicidal

## 2013-11-08 NOTE — Assessment & Plan Note (Signed)
Maintained on cymbalta by neurology

## 2013-11-08 NOTE — Assessment & Plan Note (Signed)
Controlled, no change in medication Patient advised to reduce carb and sweets, commit to regular physical activity, take meds as prescribed, test blood as directed, and attempt to lose weight, to improve blood sugar control.  

## 2013-11-08 NOTE — Assessment & Plan Note (Signed)
Unchnaged. Patient re-educated about  the importance of commitment to a  minimum of 150 minutes of exercise per week. The importance of healthy food choices with portion control discussed. Encouraged to start a food diary, count calories and to consider  joining a support group. Sample diet sheets offered. Goals set by the patient for the next several months.    

## 2013-11-09 ENCOUNTER — Other Ambulatory Visit: Payer: Self-pay | Admitting: Family Medicine

## 2013-11-17 ENCOUNTER — Telehealth: Payer: Self-pay | Admitting: Family Medicine

## 2013-11-19 NOTE — Telephone Encounter (Signed)
Office notes sent to fax number provided. [facility] PT already sent back stating they have no notes for her so I tried faxing to medical records

## 2013-11-21 ENCOUNTER — Telehealth (HOSPITAL_COMMUNITY): Payer: Self-pay | Admitting: *Deleted

## 2013-12-09 ENCOUNTER — Ambulatory Visit (HOSPITAL_COMMUNITY): Payer: PRIVATE HEALTH INSURANCE | Admitting: Psychiatry

## 2014-01-28 ENCOUNTER — Ambulatory Visit: Payer: PRIVATE HEALTH INSURANCE | Admitting: Family Medicine

## 2014-02-09 ENCOUNTER — Telehealth: Payer: Self-pay | Admitting: Family Medicine

## 2014-02-09 DIAGNOSIS — J309 Allergic rhinitis, unspecified: Secondary | ICD-10-CM

## 2014-02-09 DIAGNOSIS — E1169 Type 2 diabetes mellitus with other specified complication: Secondary | ICD-10-CM

## 2014-02-09 DIAGNOSIS — R5381 Other malaise: Secondary | ICD-10-CM

## 2014-02-09 DIAGNOSIS — E669 Obesity, unspecified: Secondary | ICD-10-CM

## 2014-02-09 DIAGNOSIS — R5383 Other fatigue: Secondary | ICD-10-CM

## 2014-02-09 DIAGNOSIS — I10 Essential (primary) hypertension: Secondary | ICD-10-CM

## 2014-02-09 DIAGNOSIS — E785 Hyperlipidemia, unspecified: Secondary | ICD-10-CM

## 2014-02-10 NOTE — Telephone Encounter (Signed)
Does pt need any labs for July appt?

## 2014-02-11 NOTE — Telephone Encounter (Signed)
pls order fasting lipid cmop and HBA1C

## 2014-02-18 NOTE — Telephone Encounter (Signed)
Labs mailed, pt aware

## 2014-02-18 NOTE — Addendum Note (Signed)
Addended by: Abner GreenspanHUDY, Jenilee Franey H on: 02/18/2014 02:04 PM   Modules accepted: Orders

## 2014-02-23 ENCOUNTER — Other Ambulatory Visit: Payer: Self-pay | Admitting: Family Medicine

## 2014-03-19 ENCOUNTER — Other Ambulatory Visit: Payer: Self-pay | Admitting: Family Medicine

## 2014-03-20 ENCOUNTER — Other Ambulatory Visit: Payer: Self-pay

## 2014-03-20 DIAGNOSIS — R7303 Prediabetes: Secondary | ICD-10-CM

## 2014-03-20 MED ORDER — METFORMIN HCL 500 MG PO TABS: 500.0000 mg | ORAL_TABLET | Freq: Three times a day (TID) | ORAL | Status: DC

## 2014-04-10 LAB — COMPREHENSIVE METABOLIC PANEL
ALBUMIN: 4.1 g/dL (ref 3.5–5.2)
ALT: 12 U/L (ref 0–35)
AST: 14 U/L (ref 0–37)
Alkaline Phosphatase: 56 U/L (ref 39–117)
BUN: 16 mg/dL (ref 6–23)
CALCIUM: 9.7 mg/dL (ref 8.4–10.5)
CHLORIDE: 99 meq/L (ref 96–112)
CO2: 30 mEq/L (ref 19–32)
CREATININE: 1.1 mg/dL (ref 0.50–1.10)
GLUCOSE: 89 mg/dL (ref 70–99)
POTASSIUM: 3.7 meq/L (ref 3.5–5.3)
Sodium: 138 mEq/L (ref 135–145)
Total Bilirubin: 0.4 mg/dL (ref 0.2–1.2)
Total Protein: 7.3 g/dL (ref 6.0–8.3)

## 2014-04-10 LAB — HEMOGLOBIN A1C
HEMOGLOBIN A1C: 6.1 % — AB (ref <5.7)
MEAN PLASMA GLUCOSE: 128 mg/dL — AB (ref <117)

## 2014-04-10 LAB — LIPID PANEL
CHOLESTEROL: 152 mg/dL (ref 0–200)
HDL: 49 mg/dL (ref >39)
LDL Cholesterol: 66 mg/dL (ref 0–99)
Total CHOL/HDL Ratio: 3.1 Ratio
Triglycerides: 183 mg/dL — ABNORMAL HIGH (ref <150)
VLDL: 37 mg/dL (ref 0–40)

## 2014-04-14 ENCOUNTER — Encounter: Payer: Self-pay | Admitting: Family Medicine

## 2014-04-14 ENCOUNTER — Ambulatory Visit (INDEPENDENT_AMBULATORY_CARE_PROVIDER_SITE_OTHER): Payer: BC Managed Care – PPO | Admitting: Family Medicine

## 2014-04-14 VITALS — BP 112/78 | HR 81 | Resp 16 | Wt 209.0 lb

## 2014-04-14 DIAGNOSIS — E669 Obesity, unspecified: Secondary | ICD-10-CM

## 2014-04-14 DIAGNOSIS — E785 Hyperlipidemia, unspecified: Secondary | ICD-10-CM

## 2014-04-14 DIAGNOSIS — F3289 Other specified depressive episodes: Secondary | ICD-10-CM

## 2014-04-14 DIAGNOSIS — R5383 Other fatigue: Secondary | ICD-10-CM

## 2014-04-14 DIAGNOSIS — R5381 Other malaise: Secondary | ICD-10-CM

## 2014-04-14 DIAGNOSIS — E1169 Type 2 diabetes mellitus with other specified complication: Secondary | ICD-10-CM

## 2014-04-14 DIAGNOSIS — F329 Major depressive disorder, single episode, unspecified: Secondary | ICD-10-CM

## 2014-04-14 DIAGNOSIS — I10 Essential (primary) hypertension: Secondary | ICD-10-CM

## 2014-04-14 DIAGNOSIS — N62 Hypertrophy of breast: Secondary | ICD-10-CM | POA: Diagnosis not present

## 2014-04-14 DIAGNOSIS — F32A Depression, unspecified: Secondary | ICD-10-CM

## 2014-04-14 DIAGNOSIS — E119 Type 2 diabetes mellitus without complications: Secondary | ICD-10-CM

## 2014-04-14 MED ORDER — PRAVASTATIN SODIUM 20 MG PO TABS: 20.0000 mg | ORAL_TABLET | Freq: Every day | ORAL | Status: DC

## 2014-04-14 MED ORDER — PHENTERMINE HCL 37.5 MG PO TABS: 37.5000 mg | ORAL_TABLET | Freq: Every day | ORAL | Status: DC

## 2014-04-14 MED ORDER — AMLODIPINE BESYLATE 10 MG PO TABS: 10.0000 mg | ORAL_TABLET | Freq: Every day | ORAL | Status: DC

## 2014-04-14 NOTE — Progress Notes (Signed)
Subjective:    Patient ID: Brittany Maxwell, female    DOB: Dec 17, 1965, 48 y.o.   MRN: [id number]  HPI The PT is here for follow up and re-evaluation of chronic medical conditions, medication management and review of any available recent lab and radiology data.  Preventive health is updated, specifically  Cancer screening and Immunization.   Requests re eval by plastic surgeon who haas seen her in the past, ongoing chronic upper back pain and large breasts , states she had qualified on medical grounds but ins had blocked it in the past wants re assessment due to new ins and ongoing problem. The PT c/o chronic dry cough, feels as though cotton in her throat has to s ip water all night sometimes feels as though throat is closing   Wants to resume phentermine for help with weight loss , highly motivated to succeed in this   Reports marked resolution in depression , a lot of support from her children, leaning on her faith, issue is her relationship with her spouse      Review of Systems See HPI Denies recent fever or chills. Denies sinus pressure, nasal congestion, ear pain or sore throat. Denies chest congestion, productive cough or wheezing. Denies chest pains, palpitations and leg swelling Denies abdominal pain, nausea, vomiting,diarrhea or constipation.   Denies dysuria, frequency, hesitancy or incontinence. Chronic and worsening upper and mid back pain Denies headaches, seizures, numbness, or tingling. Denies uncontrolled  depression, anxiety or insomnia. Denies skin break down or rash.        Objective:   Physical Exam  BP 112/78  Pulse 81  Resp 16  Wt 209 lb (94.802 kg)  SpO2 98% Patient alert and oriented and in no cardiopulmonary distress.  HEENT: No facial asymmetry, EOMI,   oropharynx pink and moist.  Neck supple no JVD, no mass.  Chest: Clear to auscultation bilaterally.  CVS: S1, S2 no murmurs, no S3.Regular rate.  ABD: Soft non tender.   Ext: No  edema  MS: Adequate ROM spine, shoulders, hips and knees.  Skin: Intact, no ulcerations or rash noted.  Psych: Good eye contact, normal affect. Memory intact not anxious or depressed appearing.  CNS: CN 2-12 intact, power,  normal throughout.no focal deficits noted.       Assessment & Plan:  Macromastia Wears 3644 D bra , chronic upper and mid back pain, want s re eval by plastics for surgery, will do , she needs this  Diabetes mellitus type 2 in obese Controlled, no change in medication Patient advised to reduce carb and sweets, commit to regular physical activity, take meds as prescribed, test blood as directed, and attempt to lose weight, to improve blood sugar control.   OBESITY Deteriorated. Patient re-educated about  the importance of commitment to a  minimum of 150 minutes of exercise per week. The importance of healthy food choices with portion control discussed. Encouraged to start a food diary, count calories and to consider  joining a support group. Sample diet sheets offered. Goals set by the patient for the next several months.    resume metformin as before  Hyperlipidemia with target LDL less than 100 Elevated TG,LDL at goal No med change Hyperlipidemia:Low fat diet discussed and encouraged.    Depression Resolved , relying on faith to manage this  Hypertension goal BP (blood pressure) < 130/80 Has severe ACE cough, needs to change medciation as a result DASH diet and commitment to daily physical activity for a minimum of  30 minutes discussed and encouraged, as a part of hypertension management. The importance of attaining a healthy weight is also discussed.

## 2014-04-14 NOTE — Patient Instructions (Addendum)
F/U with rectal and foot exam in 4 month, call if you need me before  Reduce fat in diet  Start hal;f phentermine daily  Weight loss goal of 2 to 4 pounds per month  Fasting lipid, cmp and eGFr, hBa1C, CBC,and microalb in 4 month  STOP benazepril due to cough  It is important that you exercise regularly at least 30 minutes 5 times a week. If you develop chest pain, have severe difficulty breathing, or feel very tired, stop exercising immediately and seek medical attention    You are referred for re evaluation for breast surgery  Thankful that you are better mentally

## 2014-04-14 NOTE — Assessment & Plan Note (Addendum)
Wears 44 D bra , chronic upper and mid back pain, want s re eval by plastics for surgery, will do , she needs this

## 2014-04-19 NOTE — Assessment & Plan Note (Signed)
Elevated TG,LDL at goal No med change Hyperlipidemia:Low fat diet discussed and encouraged.

## 2014-04-19 NOTE — Assessment & Plan Note (Signed)
Has severe ACE cough, needs to change medciation as a result DASH diet and commitment to daily physical activity for a minimum of 30 minutes discussed and encouraged, as a part of hypertension management. The importance of attaining a healthy weight is also discussed.

## 2014-04-19 NOTE — Assessment & Plan Note (Signed)
Deteriorated. Patient re-educated about  the importance of commitment to a  minimum of 150 minutes of exercise per week. The importance of healthy food choices with portion control discussed. Encouraged to start a food diary, count calories and to consider  joining a support group. Sample diet sheets offered. Goals set by the patient for the next several months.    resume metformin as before

## 2014-04-19 NOTE — Assessment & Plan Note (Signed)
Resolved , relying on faith to manage this

## 2014-04-19 NOTE — Assessment & Plan Note (Signed)
Controlled, no change in medication Patient advised to reduce carb and sweets, commit to regular physical activity, take meds as prescribed, test blood as directed, and attempt to lose weight, to improve blood sugar control.  

## 2014-07-01 ENCOUNTER — Other Ambulatory Visit: Payer: Self-pay | Admitting: Family Medicine

## 2014-08-07 ENCOUNTER — Other Ambulatory Visit: Payer: Self-pay | Admitting: Family Medicine

## 2014-08-21 ENCOUNTER — Encounter: Payer: BC Managed Care – PPO | Admitting: Family Medicine

## 2014-09-17 LAB — CBC WITH DIFFERENTIAL/PLATELET
BASOS ABS: 0 10*3/uL (ref 0.0–0.1)
BASOS PCT: 0 % (ref 0–1)
EOS ABS: 0.1 10*3/uL (ref 0.0–0.7)
Eosinophils Relative: 1 % (ref 0–5)
HCT: 33.8 % — ABNORMAL LOW (ref 36.0–46.0)
Hemoglobin: 11.6 g/dL — ABNORMAL LOW (ref 12.0–15.0)
LYMPHS ABS: 4 10*3/uL (ref 0.7–4.0)
Lymphocytes Relative: 34 % (ref 12–46)
MCH: 28 pg (ref 26.0–34.0)
MCHC: 34.3 g/dL (ref 30.0–36.0)
MCV: 81.6 fL (ref 78.0–100.0)
Monocytes Absolute: 0.6 10*3/uL (ref 0.1–1.0)
Monocytes Relative: 5 % (ref 3–12)
Neutro Abs: 7.1 10*3/uL (ref 1.7–7.7)
Neutrophils Relative %: 60 % (ref 43–77)
PLATELETS: 443 10*3/uL — AB (ref 150–400)
RBC: 4.14 MIL/uL (ref 3.87–5.11)
RDW: 14.4 % (ref 11.5–15.5)
WBC: 11.9 10*3/uL — ABNORMAL HIGH (ref 4.0–10.5)

## 2014-09-17 LAB — HEMOGLOBIN A1C
Hgb A1c MFr Bld: 6.2 % — ABNORMAL HIGH (ref <5.7)
Mean Plasma Glucose: 131 mg/dL — ABNORMAL HIGH (ref <117)

## 2014-09-17 LAB — COMPLETE METABOLIC PANEL WITH GFR
ALBUMIN: 3.8 g/dL (ref 3.5–5.2)
ALT: 12 U/L (ref 0–35)
AST: 13 U/L (ref 0–37)
Alkaline Phosphatase: 60 U/L (ref 39–117)
BUN: 12 mg/dL (ref 6–23)
CALCIUM: 9.4 mg/dL (ref 8.4–10.5)
CO2: 27 meq/L (ref 19–32)
Chloride: 101 mEq/L (ref 96–112)
Creat: 1.01 mg/dL (ref 0.50–1.10)
GFR, EST AFRICAN AMERICAN: 76 mL/min
GFR, EST NON AFRICAN AMERICAN: 66 mL/min
GLUCOSE: 91 mg/dL (ref 70–99)
POTASSIUM: 3.3 meq/L — AB (ref 3.5–5.3)
Sodium: 138 mEq/L (ref 135–145)
Total Bilirubin: 0.6 mg/dL (ref 0.2–1.2)
Total Protein: 7.1 g/dL (ref 6.0–8.3)

## 2014-09-17 LAB — LIPID PANEL
CHOLESTEROL: 132 mg/dL (ref 0–200)
HDL: 50 mg/dL (ref >39)
LDL Cholesterol: 52 mg/dL (ref 0–99)
Total CHOL/HDL Ratio: 2.6 Ratio
Triglycerides: 149 mg/dL (ref <150)
VLDL: 30 mg/dL (ref 0–40)

## 2014-09-17 LAB — MICROALBUMIN / CREATININE URINE RATIO
Creatinine, Urine: 382.3 mg/dL
Microalb Creat Ratio: 4.2 mg/g (ref 0.0–30.0)
Microalb, Ur: 1.6 mg/dL (ref <2.0)

## 2014-09-18 ENCOUNTER — Ambulatory Visit (INDEPENDENT_AMBULATORY_CARE_PROVIDER_SITE_OTHER): Payer: BC Managed Care – PPO

## 2014-09-18 ENCOUNTER — Encounter: Payer: Self-pay | Admitting: Family Medicine

## 2014-09-18 ENCOUNTER — Ambulatory Visit (INDEPENDENT_AMBULATORY_CARE_PROVIDER_SITE_OTHER): Payer: BC Managed Care – PPO | Admitting: Family Medicine

## 2014-09-18 VITALS — BP 118/80 | HR 88 | Resp 18 | Ht 63.0 in | Wt 204.0 lb

## 2014-09-18 DIAGNOSIS — IMO0001 Reserved for inherently not codable concepts without codable children: Secondary | ICD-10-CM

## 2014-09-18 DIAGNOSIS — F32A Depression, unspecified: Secondary | ICD-10-CM

## 2014-09-18 DIAGNOSIS — E785 Hyperlipidemia, unspecified: Secondary | ICD-10-CM | POA: Diagnosis not present

## 2014-09-18 DIAGNOSIS — Z23 Encounter for immunization: Secondary | ICD-10-CM | POA: Diagnosis not present

## 2014-09-18 DIAGNOSIS — F329 Major depressive disorder, single episode, unspecified: Secondary | ICD-10-CM | POA: Diagnosis not present

## 2014-09-18 DIAGNOSIS — E669 Obesity, unspecified: Secondary | ICD-10-CM | POA: Diagnosis not present

## 2014-09-18 DIAGNOSIS — I10 Essential (primary) hypertension: Secondary | ICD-10-CM | POA: Diagnosis not present

## 2014-09-18 DIAGNOSIS — E119 Type 2 diabetes mellitus without complications: Secondary | ICD-10-CM | POA: Diagnosis not present

## 2014-09-18 DIAGNOSIS — E1169 Type 2 diabetes mellitus with other specified complication: Secondary | ICD-10-CM

## 2014-09-18 NOTE — Progress Notes (Signed)
   Subjective:    Patient ID: Brittany Maxwell, female    DOB: [birth year], 48 y.o.   MRN: [id number]  HPI The PT is here for follow up and re-evaluation of chronic medical conditions, medication management and review of any available recent lab and radiology data.  Preventive health is updated, specifically  Cancer screening and Immunization.    The PT denies any adverse reactions to current medications since the last visit.  There are no new concerns.  There are no specific complaints      Review of Systems See HPI Denies recent fever or chills. Denies sinus pressure, nasal congestion, ear pain or sore throat. Denies chest congestion, productive cough or wheezing. Denies chest pains, palpitations and leg swelling Denies abdominal pain, nausea, vomiting,diarrhea or constipation.   Denies dysuria, frequency, hesitancy or incontinence. Denies joint pain, swelling and limitation in mobility. Denies headaches, seizures, numbness, or tingling. Denies depression, anxiety or insomnia. Denies skin break down or rash.        Objective:   Physical Exam BP 118/80 mmHg  Pulse 88  Resp 18  Ht 5\' 3"  (1.6 m)  Wt 204 lb 0.6 oz (92.552 kg)  BMI 36.15 kg/m2  SpO2 95% Patient alert and oriented and in no cardiopulmonary distress.  HEENT: No facial asymmetry, EOMI,   oropharynx pink and moist.  Neck supple no JVD, no mass.  Chest: Clear to auscultation bilaterally.  CVS: S1, S2 no murmurs, no S3.Regular rate.  ABD: Soft non tender.   Ext: No edema  MS: Adequate ROM spine, shoulders, hips and knees.  Skin: Intact, no ulcerations or rash noted.  Psych: Good eye contact, normal affect. Memory intact not anxious or depressed appearing.  CNS: CN 2-12 intact, power,  normal throughout.no focal deficits noted.        Assessment & Plan:  Need for prophylactic vaccination and inoculation against influenza Vaccine administered at visit.   Hypertension goal BP (blood pressure) <  130/80 Controlled, no change in medication DASH diet and commitment to daily physical activity for a minimum of 30 minutes discussed and encouraged, as a part of hypertension management. The importance of attaining a healthy weight is also discussed.   Diabetes mellitus type 2 in obese Controlled, no change in medication Patient advised to reduce carb and sweets, commit to regular physical activity, take meds as prescribed, test blood as directed, and attempt to lose weight, to improve blood sugar control.   Hyperlipidemia with target LDL less than 100 Controlled, no change in medication Hyperlipidemia:Low fat diet discussed and encouraged.    Obesity, Class II, BMI 35.0-39.9, with comorbidity (see actual BMI) Improved. Pt applauded on succesful weight loss through lifestyle change, and encouraged to continue same. Weight loss goal set for the next several months.   Depression Resolved and on no medication

## 2014-09-18 NOTE — Assessment & Plan Note (Signed)
Controlled, no change in medication Hyperlipidemia:Low fat diet discussed and encouraged.  \ 

## 2014-09-18 NOTE — Assessment & Plan Note (Signed)
Controlled, no change in medication DASH diet and commitment to daily physical activity for a minimum of 30 minutes discussed and encouraged, as a part of hypertension management. The importance of attaining a healthy weight is also discussed.  

## 2014-09-18 NOTE — Assessment & Plan Note (Signed)
Vaccine administered at visit.  

## 2014-09-18 NOTE — Assessment & Plan Note (Signed)
Resolved and on no medication 

## 2014-09-18 NOTE — Assessment & Plan Note (Signed)
Controlled, no change in medication Patient advised to reduce carb and sweets, commit to regular physical activity, take meds as prescribed, test blood as directed, and attempt to lose weight, to improve blood sugar control.  

## 2014-09-18 NOTE — Assessment & Plan Note (Signed)
Improved. Pt applauded on succesful weight loss through lifestyle change, and encouraged to continue same. Weight loss goal set for the next several months.  

## 2014-09-18 NOTE — Patient Instructions (Signed)
CPE and pap in 4.5 month, call if you need mbefore  Blood pressure and cholesterol and blood sugar are excellent, congrats on weight loss also   Flu vaccine today  HBA1C and chem 7 and EGFR, TSH in 4.5 month   Foot exam today is good, and you are referred for diabetic eye exam

## 2014-10-09 ENCOUNTER — Other Ambulatory Visit: Payer: Self-pay

## 2014-10-09 ENCOUNTER — Telehealth: Payer: Self-pay | Admitting: Family Medicine

## 2014-10-09 MED ORDER — PROMETHAZINE-DM 6.25-15 MG/5ML PO SYRP: 5.0000 mL | ORAL_SOLUTION | Freq: Two times a day (BID) | ORAL | Status: DC | PRN

## 2014-10-09 MED ORDER — PREDNISONE 5 MG PO TABS: 5.0000 mg | ORAL_TABLET | Freq: Two times a day (BID) | ORAL | Status: DC

## 2014-10-09 NOTE — Telephone Encounter (Signed)
Coughing really bad with some hoarseness -phlegm is clear to slightly yellow. Taking tylenol cold and it helps some but when she starts coughing its really hard to stop and she wets herself. Needs something to control the cough sent in. No other symptoms.  [facility]

## 2014-10-09 NOTE — Telephone Encounter (Signed)
Pt aware and med sent  

## 2014-10-09 NOTE — Telephone Encounter (Signed)
Pls send phenergan dm one tsp twice daily as needed x 112cc also prednisone 5 mg one twice daily for 5 days  Pls explain the syrup has a sedative s/e so bedtime restriction  Preferred  Explain treating cough as though due to uncontrolled allergies, if worsens call back

## 2014-10-12 ENCOUNTER — Other Ambulatory Visit: Payer: Self-pay | Admitting: Family Medicine

## 2014-10-12 NOTE — Telephone Encounter (Signed)
error 

## 2014-11-13 ENCOUNTER — Other Ambulatory Visit: Payer: Self-pay | Admitting: Family Medicine

## 2014-11-27 ENCOUNTER — Other Ambulatory Visit: Payer: Self-pay | Admitting: Family Medicine

## 2015-01-07 ENCOUNTER — Other Ambulatory Visit: Payer: Self-pay | Admitting: Family Medicine

## 2015-01-18 ENCOUNTER — Other Ambulatory Visit: Payer: Self-pay | Admitting: Family Medicine

## 2015-01-26 ENCOUNTER — Other Ambulatory Visit: Payer: Self-pay | Admitting: Family Medicine

## 2015-01-26 DIAGNOSIS — Z1231 Encounter for screening mammogram for malignant neoplasm of breast: Secondary | ICD-10-CM

## 2015-02-02 ENCOUNTER — Other Ambulatory Visit: Payer: Self-pay | Admitting: Family Medicine

## 2015-02-15 ENCOUNTER — Ambulatory Visit (HOSPITAL_COMMUNITY)
Admission: RE | Admit: 2015-02-15 | Discharge: 2015-02-15 | Disposition: A | Discharge status: Home / Self Care | Payer: BLUE CROSS/BLUE SHIELD | Source: Ambulatory Visit | Attending: Family Medicine | Admitting: Family Medicine

## 2015-02-15 DIAGNOSIS — E669 Obesity, unspecified: Secondary | ICD-10-CM | POA: Diagnosis not present

## 2015-02-15 DIAGNOSIS — E119 Type 2 diabetes mellitus without complications: Secondary | ICD-10-CM | POA: Diagnosis not present

## 2015-02-15 DIAGNOSIS — Z1231 Encounter for screening mammogram for malignant neoplasm of breast: Secondary | ICD-10-CM | POA: Diagnosis not present

## 2015-02-16 LAB — COMPLETE METABOLIC PANEL WITH GFR
ALK PHOS: 55 U/L (ref 39–117)
ALT: 13 U/L (ref 0–35)
AST: 15 U/L (ref 0–37)
Albumin: 3.9 g/dL (ref 3.5–5.2)
BILIRUBIN TOTAL: 0.3 mg/dL (ref 0.2–1.2)
BUN: 14 mg/dL (ref 6–23)
CO2: 33 mEq/L — ABNORMAL HIGH (ref 19–32)
Calcium: 9.5 mg/dL (ref 8.4–10.5)
Chloride: 101 mEq/L (ref 96–112)
Creat: 1.01 mg/dL (ref 0.50–1.10)
GFR, EST NON AFRICAN AMERICAN: 66 mL/min
GFR, Est African American: 76 mL/min
GLUCOSE: 102 mg/dL — AB (ref 70–99)
Potassium: 3 mEq/L — ABNORMAL LOW (ref 3.5–5.3)
Sodium: 138 mEq/L (ref 135–145)
TOTAL PROTEIN: 7.4 g/dL (ref 6.0–8.3)

## 2015-02-16 LAB — TSH: TSH: 2.134 u[IU]/mL (ref 0.350–4.500)

## 2015-02-16 LAB — HEMOGLOBIN A1C
HEMOGLOBIN A1C: 6.2 % — AB (ref <5.7)
Mean Plasma Glucose: 131 mg/dL — ABNORMAL HIGH (ref <117)

## 2015-02-17 ENCOUNTER — Ambulatory Visit (INDEPENDENT_AMBULATORY_CARE_PROVIDER_SITE_OTHER): Payer: BLUE CROSS/BLUE SHIELD | Admitting: Family Medicine

## 2015-02-17 ENCOUNTER — Encounter: Payer: BLUE CROSS/BLUE SHIELD | Admitting: Family Medicine

## 2015-02-17 ENCOUNTER — Other Ambulatory Visit (HOSPITAL_COMMUNITY)
Admission: RE | Admit: 2015-02-17 | Discharge: 2015-02-17 | Disposition: A | Discharge status: Home / Self Care | Payer: BLUE CROSS/BLUE SHIELD | Source: Ambulatory Visit | Attending: Family Medicine | Admitting: Family Medicine

## 2015-02-17 ENCOUNTER — Encounter: Payer: Self-pay | Admitting: Family Medicine

## 2015-02-17 VITALS — BP 130/82 | HR 84 | Resp 16 | Ht 63.0 in | Wt 202.4 lb

## 2015-02-17 DIAGNOSIS — I10 Essential (primary) hypertension: Secondary | ICD-10-CM

## 2015-02-17 DIAGNOSIS — E669 Obesity, unspecified: Secondary | ICD-10-CM

## 2015-02-17 DIAGNOSIS — Z124 Encounter for screening for malignant neoplasm of cervix: Secondary | ICD-10-CM | POA: Insufficient documentation

## 2015-02-17 DIAGNOSIS — Z1151 Encounter for screening for human papillomavirus (HPV): Secondary | ICD-10-CM | POA: Diagnosis not present

## 2015-02-17 DIAGNOSIS — Z0000 Encounter for general adult medical examination without abnormal findings: Secondary | ICD-10-CM | POA: Insufficient documentation

## 2015-02-17 DIAGNOSIS — Z1211 Encounter for screening for malignant neoplasm of colon: Secondary | ICD-10-CM | POA: Diagnosis not present

## 2015-02-17 DIAGNOSIS — E1169 Type 2 diabetes mellitus with other specified complication: Secondary | ICD-10-CM

## 2015-02-17 DIAGNOSIS — E119 Type 2 diabetes mellitus without complications: Secondary | ICD-10-CM

## 2015-02-17 LAB — HEMOCCULT GUIAC POC 1CARD (OFFICE): Fecal Occult Blood, POC: NEGATIVE

## 2015-02-17 MED ORDER — PHENTERMINE HCL 37.5 MG PO TABS
ORAL_TABLET | ORAL | Status: DC
Start: 2015-02-17 — End: 2015-11-11

## 2015-02-17 NOTE — Assessment & Plan Note (Signed)

## 2015-02-17 NOTE — Progress Notes (Signed)
   Subjective:    Patient ID: Brittany Maxwell, female    DOB: 12/20/1965, 49 y.o.   MRN: [id number]  HPI Patient is in for annual physical exam. No other health concerns are expressed or addressed at the visit. Recent labs, if available are reviewed. Immunization is reviewed , and  updated if needed.    Review of Systems See HPI     Objective:   Physical Exam BP 130/82 mmHg  Pulse 84  Resp 16  Ht 5\' 3"  (1.6 m)  Wt 202 lb 6.4 oz (91.808 kg)  BMI 35.86 kg/m2  SpO2 98%  LMP 02/15/2015 Pleasant well nourished female, alert and oriented x 3, in no cardio-pulmonary distress. Afebrile. HEENT No facial trauma or asymetry. Sinuses non tender.  Extra occullar muscles intact, pupils equally reactive to light. External ears normal, tympanic membranes clear. Oropharynx moist, no exudate,fairly  good dentition. Neck: supple, no adenopathy,JVD or thyromegaly.No bruits.  Chest: Clear to ascultation bilaterally.No crackles or wheezes. Non tender to palpation  Breast: No asymetry,no masses or lumps. No tenderness. No nipple discharge or inversion. No axillary or supraclavicular adenopathy  Cardiovascular system; Heart sounds normal,  S1 and  S2 ,no S3.  No murmur, or thrill. Apical beat not displaced Peripheral pulses normal.  Abdomen: Soft, non tender, no organomegaly or masses. No bruits. Bowel sounds normal. No guarding, tenderness or rebound.  Rectal:  Normal sphincter tone. No mass.No rectal masses.  Guaiac negative stool.  GU: External genitalia normal female genitalia , female distribution of hair. No lesions. Urethral meatus normal in size, no  Prolapse, no lesions visibly  Present. Bladder non tender. Vagina pink and moist , with no visible lesions , discharge present . Adequate pelvic support no  cystocele or rectocele noted Cervix pink and appears healthy, no lesions or ulcerations noted, no discharge noted from os Uterus normal size, no adnexal masses, no  cervical motion or adnexal tenderness.   Musculoskeletal exam: Full ROM of spine, hips , shoulders and knees. No deformity ,swelling or crepitus noted. No muscle wasting or atrophy.   Neurologic: Cranial nerves 2 to 12 intact. Power, tone ,sensation and reflexes normal throughout. No disturbance in gait. No tremor.  Skin: Intact, no ulceration, erythema , scaling or rash noted. Pigmentation normal throughout  Psych; Normal mood and affect. Judgement and concentration normal        Assessment & Plan:  Annual physical exam Annual exam as documented. Counseling done  re healthy lifestyle involving commitment to 150 minutes exercise per week, heart healthy diet, and attaining healthy weight.The importance of adequate sleep also discussed. Regular seat belt use and home safety, is also discussed. Changes in health habits are decided on by the patient with goals and time frames  set for achieving them. Immunization and cancer screening needs are specifically addressed at this visit.

## 2015-02-17 NOTE — Patient Instructions (Signed)
Annual wellness , initial in mid  September , call if you need me before  Thankful you are doing very well  It is important that you exercise regularly at least 30 minutes 7 times a week. If you develop chest pain, have severe difficulty breathing, or feel very tired, stop exercising immediately and seek medical attention   A healthy diet is rich in fruit, vegetables and whole grains. Poultry fish, nuts and beans are a healthy choice for protein rather then red meat. A low sodium diet and drinking 64 ounces of water daily is generally recommended. Oils and sweet should be limited. Carbohydrates especially for those who are diabetic or overweight, should be limited to 60-45 gram per meal. It is important to eat on a regular schedule, at least 3 times daily. Snacks should be primarily fruits, vegetables or nuts.  Thanks for choosing [facility], we consider it a privelige to serve you.  Chem7 and EGFR and hBA1C in September before visit, non fast

## 2015-02-18 LAB — CYTOLOGY - PAP

## 2015-02-26 ENCOUNTER — Other Ambulatory Visit: Payer: Self-pay | Admitting: Family Medicine

## 2015-04-06 ENCOUNTER — Other Ambulatory Visit: Payer: Self-pay | Admitting: Family Medicine

## 2015-06-16 ENCOUNTER — Other Ambulatory Visit: Payer: Self-pay | Admitting: Family Medicine

## 2015-07-14 ENCOUNTER — Telehealth: Payer: Self-pay

## 2015-07-14 DIAGNOSIS — E669 Obesity, unspecified: Principal | ICD-10-CM

## 2015-07-14 DIAGNOSIS — E1169 Type 2 diabetes mellitus with other specified complication: Secondary | ICD-10-CM

## 2015-07-14 NOTE — Telephone Encounter (Signed)
Last labs ordered in May.  Reordered to have done before November appointment

## 2015-07-20 ENCOUNTER — Other Ambulatory Visit: Payer: Self-pay | Admitting: Family Medicine

## 2015-08-07 DIAGNOSIS — E119 Type 2 diabetes mellitus without complications: Secondary | ICD-10-CM | POA: Diagnosis not present

## 2015-08-07 DIAGNOSIS — E669 Obesity, unspecified: Secondary | ICD-10-CM | POA: Diagnosis not present

## 2015-08-07 LAB — COMPLETE METABOLIC PANEL WITH GFR
ALT: 11 U/L (ref 6–29)
AST: 14 U/L (ref 10–35)
Albumin: 4 g/dL (ref 3.6–5.1)
Alkaline Phosphatase: 68 U/L (ref 33–130)
BUN: 11 mg/dL (ref 7–25)
CO2: 27 mmol/L (ref 20–31)
CREATININE: 1.09 mg/dL — AB (ref 0.50–1.05)
Calcium: 9.2 mg/dL (ref 8.6–10.4)
Chloride: 103 mmol/L (ref 98–110)
GFR, Est African American: 68 mL/min (ref >=60)
GFR, Est Non African American: 59 mL/min — ABNORMAL LOW (ref >=60)
GLUCOSE: 84 mg/dL (ref 65–99)
Potassium: 3.2 mmol/L — ABNORMAL LOW (ref 3.5–5.3)
SODIUM: 139 mmol/L (ref 135–146)
Total Bilirubin: 0.5 mg/dL (ref 0.2–1.2)
Total Protein: 7.4 g/dL (ref 6.1–8.1)

## 2015-08-08 LAB — HEMOGLOBIN A1C
Hgb A1c MFr Bld: 6.6 % — ABNORMAL HIGH (ref <5.7)
Mean Plasma Glucose: 143 mg/dL — ABNORMAL HIGH (ref <117)

## 2015-08-10 ENCOUNTER — Encounter: Payer: Medicare Other | Admitting: Family Medicine

## 2015-08-11 MED ORDER — POTASSIUM CHLORIDE ER 10 MEQ PO TBCR: 10.0000 meq | EXTENDED_RELEASE_TABLET | Freq: Two times a day (BID) | ORAL | Status: DC

## 2015-08-11 NOTE — Addendum Note (Signed)
Addended by: Abner GreenspanHUDY, Khristopher Kapaun H on: 08/11/2015 10:53 AM   Modules accepted: Orders

## 2015-11-01 ENCOUNTER — Telehealth: Payer: Self-pay

## 2015-11-01 DIAGNOSIS — I10 Essential (primary) hypertension: Secondary | ICD-10-CM

## 2015-11-01 DIAGNOSIS — E1169 Type 2 diabetes mellitus with other specified complication: Secondary | ICD-10-CM

## 2015-11-01 DIAGNOSIS — E669 Obesity, unspecified: Secondary | ICD-10-CM

## 2015-11-01 DIAGNOSIS — E785 Hyperlipidemia, unspecified: Secondary | ICD-10-CM

## 2015-11-01 NOTE — Telephone Encounter (Signed)
Labs ordered and patient aware to go to [facility] fasting on or after the 6th

## 2015-11-06 ENCOUNTER — Other Ambulatory Visit: Payer: Self-pay | Admitting: Family Medicine

## 2015-11-10 LAB — LIPID PANEL
CHOLESTEROL: 146 mg/dL (ref 125–200)
HDL: 46 mg/dL (ref >=46)
LDL CALC: 61 mg/dL (ref <130)
TRIGLYCERIDES: 194 mg/dL — AB (ref <150)
Total CHOL/HDL Ratio: 3.2 Ratio (ref <=5.0)
VLDL: 39 mg/dL — ABNORMAL HIGH (ref <30)

## 2015-11-10 LAB — COMPLETE METABOLIC PANEL WITH GFR
ALT: 11 U/L (ref 6–29)
AST: 14 U/L (ref 10–35)
Albumin: 3.9 g/dL (ref 3.6–5.1)
Alkaline Phosphatase: 64 U/L (ref 33–130)
BUN: 12 mg/dL (ref 7–25)
CALCIUM: 9.2 mg/dL (ref 8.6–10.4)
CHLORIDE: 98 mmol/L (ref 98–110)
CO2: 29 mmol/L (ref 20–31)
Creat: 1.01 mg/dL (ref 0.50–1.05)
GFR, EST AFRICAN AMERICAN: 75 mL/min (ref >=60)
GFR, EST NON AFRICAN AMERICAN: 65 mL/min (ref >=60)
Glucose, Bld: 87 mg/dL (ref 65–99)
POTASSIUM: 3.3 mmol/L — AB (ref 3.5–5.3)
Sodium: 137 mmol/L (ref 135–146)
Total Bilirubin: 0.4 mg/dL (ref 0.2–1.2)
Total Protein: 7.5 g/dL (ref 6.1–8.1)

## 2015-11-10 LAB — HEMOGLOBIN A1C
HEMOGLOBIN A1C: 6.2 % — AB (ref <5.7)
MEAN PLASMA GLUCOSE: 131 mg/dL — AB (ref <117)

## 2015-11-11 ENCOUNTER — Ambulatory Visit (INDEPENDENT_AMBULATORY_CARE_PROVIDER_SITE_OTHER): Payer: Medicare Other | Admitting: Family Medicine

## 2015-11-11 ENCOUNTER — Encounter: Payer: Self-pay | Admitting: Family Medicine

## 2015-11-11 VITALS — BP 120/84 | HR 84 | Resp 16 | Ht 63.0 in | Wt 209.0 lb

## 2015-11-11 DIAGNOSIS — E119 Type 2 diabetes mellitus without complications: Secondary | ICD-10-CM | POA: Diagnosis not present

## 2015-11-11 DIAGNOSIS — I10 Essential (primary) hypertension: Secondary | ICD-10-CM

## 2015-11-11 DIAGNOSIS — Z23 Encounter for immunization: Secondary | ICD-10-CM | POA: Diagnosis not present

## 2015-11-11 DIAGNOSIS — Z0000 Encounter for general adult medical examination without abnormal findings: Secondary | ICD-10-CM

## 2015-11-11 DIAGNOSIS — E785 Hyperlipidemia, unspecified: Secondary | ICD-10-CM

## 2015-11-11 DIAGNOSIS — E559 Vitamin D deficiency, unspecified: Secondary | ICD-10-CM

## 2015-11-11 DIAGNOSIS — E669 Obesity, unspecified: Secondary | ICD-10-CM

## 2015-11-11 DIAGNOSIS — E1169 Type 2 diabetes mellitus with other specified complication: Secondary | ICD-10-CM

## 2015-11-11 MED ORDER — POTASSIUM CHLORIDE ER 10 MEQ PO TBCR
10.0000 meq | EXTENDED_RELEASE_TABLET | Freq: Three times a day (TID) | ORAL | Status: DC
Start: 2015-11-11 — End: 2016-06-19

## 2015-11-11 NOTE — Assessment & Plan Note (Signed)

## 2015-11-11 NOTE — Patient Instructions (Signed)
Annual physical exam end August/ early September, call if you need me sooner  Foot exam today is good and you need an eye exam and  are referred   Increase potassium to 3 tabs daily, OK to take 2 together and the other on its own Potassium rich food list is provided also  Reduce cheese and butter , egg yoolks and fried and fatty foods , this will improve triglycerides  Pls commit to physical activity for health every day for 30  mins  Pls look at living will meed  Weight loss goal of 5 to 8 pounds mainly through changing eating habits  Flu vaccine today  Fasting labs  In 5 months and 3 weeks  Thanks for choosing [facility], we consider it a privelige to serve you.     Fall Prevention in the Home  Falls can cause injuries. They can happen to people of all ages. There are many things you can do to make your home safe and to help prevent falls.  WHAT CAN I DO ON THE OUTSIDE OF MY HOME?  Regularly fix the edges of walkways and driveways and fix any cracks.  Remove anything that might make you trip as you walk through a door, such as a raised step or threshold.  Trim any bushes or trees on the path to your home.  Use bright outdoor lighting.  Clear any walking paths of anything that might make someone trip, such as rocks or tools.  Regularly check to see if handrails are loose or broken. Make sure that both sides of any steps have handrails.  Any raised decks and porches should have guardrails on the edges.  Have any leaves, snow, or ice cleared regularly.  Use sand or salt on walking paths during winter.  Clean up any spills in your garage right away. This includes oil or grease spills. WHAT CAN I DO IN THE BATHROOM?   Use night lights.  Install grab bars by the toilet and in the tub and shower. Do not use towel bars as grab bars.  Use non-skid mats or decals in the tub or shower.  If you need to sit down in the shower, use a plastic, non-slip  stool.  Keep the floor dry. Clean up any water that spills on the floor as soon as it happens.  Remove soap buildup in the tub or shower regularly.  Attach bath mats securely with double-sided non-slip rug tape.  Do not have throw rugs and other things on the floor that can make you trip. WHAT CAN I DO IN THE BEDROOM?  Use night lights.  Make sure that you have a light by your bed that is easy to reach.  Do not use any sheets or blankets that are too big for your bed. They should not hang down onto the floor.  Have a firm chair that has side arms. You can use this for support while you get dressed.  Do not have throw rugs and other things on the floor that can make you trip. WHAT CAN I DO IN THE KITCHEN?  Clean up any spills right away.  Avoid walking on wet floors.  Keep items that you use a lot in easy-to-reach places.  If you need to reach something above you, use a strong step stool that has a grab bar.  Keep electrical cords out of the way.  Do not use floor polish or wax that makes floors slippery. If you must use wax,  use non-skid floor wax.  Do not have throw rugs and other things on the floor that can make you trip. WHAT CAN I DO WITH MY STAIRS?  Do not leave any items on the stairs.  Make sure that there are handrails on both sides of the stairs and use them. Fix handrails that are broken or loose. Make sure that handrails are as long as the stairways.  Check any carpeting to make sure that it is firmly attached to the stairs. Fix any carpet that is loose or worn.  Avoid having throw rugs at the top or bottom of the stairs. If you do have throw rugs, attach them to the floor with carpet tape.  Make sure that you have a light switch at the top of the stairs and the bottom of the stairs. If you do not have them, ask someone to add them for you. WHAT ELSE CAN I DO TO HELP PREVENT FALLS?  Wear shoes that:  Do not have high heels.  Have rubber bottoms.  Are  comfortable and fit you well.  Are closed at the toe. Do not wear sandals.  If you use a stepladder:  Make sure that it is fully opened. Do not climb a closed stepladder.  Make sure that both sides of the stepladder are locked into place.  Ask someone to hold it for you, if possible.  Clearly mark and make sure that you can see:  Any grab bars or handrails.  First and last steps.  Where the edge of each step is.  Use tools that help you move around (mobility aids) if they are needed. These include:  Canes.  Walkers.  Scooters.  Crutches.  Turn on the lights when you go into a dark area. Replace any light bulbs as soon as they burn out.  Set up your furniture so you have a clear path. Avoid moving your furniture around.  If any of your floors are uneven, fix them.  If there are any pets around you, be aware of where they are.  Review your medicines with your doctor. Some medicines can make you feel dizzy. This can increase your chance of falling. Ask your doctor what other things that you can do to help prevent falls.   This information is not intended to replace advice given to you by your health care provider. Make sure you discuss any questions you have with your health care provider.   Document Released: 07/15/2009 Document Revised: 02/02/2015 Document Reviewed: 10/23/2014 Elsevier Interactive Patient Education [employer]

## 2015-11-11 NOTE — Assessment & Plan Note (Signed)
After obtaining informed consent, the vaccine is  administered by LPN.  

## 2015-11-11 NOTE — Addendum Note (Signed)
Addended by: Abner Greenspan on: 11/11/2015 09:25 AM   Modules accepted: Orders

## 2015-11-11 NOTE — Progress Notes (Signed)
Subjective:    Patient ID: Brittany Maxwell, female    DOB: [birth year], 50 y.o.   MRN: [id number]  HPI  Preventive Screening-Counseling & Management   Patient present here today for a Medicare annual wellness visit.   Current Problems (verified)   Medications Prior to Visit Allergies (verified)   PAST HISTORY  Family History (verified)   Social History  Married for 32 years, 2 children, disabled since 2009. Never smoker   Risk Factors  Current exercise habits:  Walks several days a week  Dietary issues discussed: heart healthy diet encouraged, limit carbs and fried fatty foods    Cardiac risk factors: type 2 dm   Depression Screen  (Note: if answer to either of the following is "Yes", a more complete depression screening is indicated)   Over the past two weeks, have you felt down, depressed or hopeless? No  Over the past two weeks, have you felt little interest or pleasure in doing things? No  Have you lost interest or pleasure in daily life? No  Do you often feel hopeless? No  Do you cry easily over simple problems? No   Activities of Daily Living  In your present state of health, do you have any difficulty performing the following activities?  Driving?: Limits distance due to problems gettig in and out of the car Managing money?: No Feeding yourself?:No Getting from bed to chair?:No Climbing a flight of stairs?:No Preparing food and eating?:No Bathing or showering?:No Getting dressed?:No Getting to the toilet?:No Using the toilet?:No Moving around from place to place?: yes at times due to back pain  Fall Risk Assessment In the past year have you fallen or had a near fall?:No Are you currently taking any medications that make you dizzy?:No   Hearing Difficulties: No Do you often ask people to speak up or repeat themselves?:No Do you experience ringing or noises in your ears?:No Do you have difficulty understanding soft or whispered  voices?:No  Cognitive Testing  Alert? Yes Normal Appearance?Yes  Oriented to person? Yes Place? Yes  Time? Yes  Displays appropriate judgment?Yes  Can read the correct time from a watch face? yes Are you having problems remembering things?No  Advanced Directives have been discussed with the patient?Yes, no living will, brochure given    List the Names of Other Physician/Practitioners you currently use:  Dr Gerilyn Pilgrim (ortho)   Indicate any recent Medical Services you may have received from other than [facility] providers in the past year (date may be approximate).   Assessment:    Annual Wellness Exam   Plan:    .  Medicare Attestation  I have personally reviewed:  The patient's medical and social history  Their use of alcohol, tobacco or illicit drugs  Their current medications and supplements  The patient's functional ability including ADLs,fall risks, home safety risks, cognitive, and hearing and visual impairment  Diet and physical activities  Evidence for depression or mood disorders  The patient's weight, height, BMI, and visual acuity have been recorded in the chart. I have made referrals, counseling, and provided education to the patient based on review of the above and I have provided the patient with a written personalized care plan for preventive services.      Review of Systems     Objective:   Physical Exam  BP 120/84 mmHg  Pulse 84  Resp 16  Ht  (1.6 m)  Wt 209 lb (94.802 kg)  BMI 37.03 kg/m2  SpO2 98%  Assessment & Plan:  Medicare annual wellness visit, subsequent Annual exam as documented. Counseling done  re healthy lifestyle involving commitment to 150 minutes exercise per week, heart healthy diet, and attaining healthy weight.The importance of adequate sleep also discussed. Regular seat belt use and home safety, is also discussed. Changes in health habits are decided on by the patient with goals and time frames  set for achieving  them. Immunization and cancer screening needs are specifically addressed at this visit.   Need for prophylactic vaccination and inoculation against influenza After obtaining informed consent, the vaccine is  administered by LPN.

## 2015-11-12 LAB — MICROALBUMIN / CREATININE URINE RATIO
CREATININE, URINE: 113 mg/dL (ref 20–320)
MICROALB UR: 1 mg/dL
MICROALB/CREAT RATIO: 9 ug/mg{creat} (ref <30)

## 2015-11-23 ENCOUNTER — Other Ambulatory Visit: Payer: Self-pay | Admitting: Family Medicine

## 2015-12-01 ENCOUNTER — Other Ambulatory Visit: Payer: Self-pay | Admitting: Family Medicine

## 2015-12-06 DIAGNOSIS — E119 Type 2 diabetes mellitus without complications: Secondary | ICD-10-CM | POA: Diagnosis not present

## 2015-12-06 DIAGNOSIS — M2550 Pain in unspecified joint: Secondary | ICD-10-CM | POA: Diagnosis not present

## 2015-12-06 DIAGNOSIS — M549 Dorsalgia, unspecified: Secondary | ICD-10-CM | POA: Diagnosis not present

## 2016-02-03 ENCOUNTER — Other Ambulatory Visit: Payer: Self-pay | Admitting: Family Medicine

## 2016-02-14 ENCOUNTER — Other Ambulatory Visit: Payer: Self-pay | Admitting: Family Medicine

## 2016-04-26 ENCOUNTER — Other Ambulatory Visit: Payer: Self-pay | Admitting: Family Medicine

## 2016-06-01 ENCOUNTER — Encounter: Payer: BLUE CROSS/BLUE SHIELD | Admitting: Family Medicine

## 2016-06-19 ENCOUNTER — Encounter: Payer: Self-pay | Admitting: Family Medicine

## 2016-06-19 ENCOUNTER — Ambulatory Visit (INDEPENDENT_AMBULATORY_CARE_PROVIDER_SITE_OTHER): Payer: BLUE CROSS/BLUE SHIELD | Admitting: Family Medicine

## 2016-06-19 ENCOUNTER — Other Ambulatory Visit: Payer: Self-pay | Admitting: Family Medicine

## 2016-06-19 VITALS — BP 130/84 | HR 73 | Resp 16 | Ht 63.0 in | Wt 211.4 lb

## 2016-06-19 DIAGNOSIS — Z23 Encounter for immunization: Secondary | ICD-10-CM

## 2016-06-19 DIAGNOSIS — E785 Hyperlipidemia, unspecified: Secondary | ICD-10-CM | POA: Diagnosis not present

## 2016-06-19 DIAGNOSIS — I10 Essential (primary) hypertension: Secondary | ICD-10-CM | POA: Diagnosis not present

## 2016-06-19 DIAGNOSIS — Z0000 Encounter for general adult medical examination without abnormal findings: Secondary | ICD-10-CM

## 2016-06-19 DIAGNOSIS — E1169 Type 2 diabetes mellitus with other specified complication: Secondary | ICD-10-CM

## 2016-06-19 DIAGNOSIS — E119 Type 2 diabetes mellitus without complications: Secondary | ICD-10-CM

## 2016-06-19 DIAGNOSIS — Z114 Encounter for screening for human immunodeficiency virus [HIV]: Secondary | ICD-10-CM | POA: Diagnosis not present

## 2016-06-19 DIAGNOSIS — E669 Obesity, unspecified: Secondary | ICD-10-CM | POA: Diagnosis not present

## 2016-06-19 DIAGNOSIS — D649 Anemia, unspecified: Secondary | ICD-10-CM | POA: Diagnosis not present

## 2016-06-19 DIAGNOSIS — R7989 Other specified abnormal findings of blood chemistry: Secondary | ICD-10-CM | POA: Diagnosis not present

## 2016-06-19 DIAGNOSIS — Z1211 Encounter for screening for malignant neoplasm of colon: Secondary | ICD-10-CM | POA: Diagnosis not present

## 2016-06-19 LAB — CBC
HCT: 34.6 % — ABNORMAL LOW (ref 35.0–45.0)
Hemoglobin: 11.6 g/dL — ABNORMAL LOW (ref 11.7–15.5)
MCH: 27.6 pg (ref 27.0–33.0)
MCHC: 33.5 g/dL (ref 32.0–36.0)
MCV: 82.4 fL (ref 80.0–100.0)
MPV: 9.3 fL (ref 7.5–12.5)
PLATELETS: 442 10*3/uL — AB (ref 140–400)
RBC: 4.2 MIL/uL (ref 3.80–5.10)
RDW: 14.3 % (ref 11.0–15.0)
WBC: 8.2 10*3/uL (ref 3.8–10.8)

## 2016-06-19 LAB — POC HEMOCCULT BLD/STL (OFFICE/1-CARD/DIAGNOSTIC): Fecal Occult Blood, POC: NEGATIVE

## 2016-06-19 LAB — TSH: TSH: 0.03 m[IU]/L — AB

## 2016-06-19 NOTE — Addendum Note (Signed)
Addended by: Abner GreenspanHUDY, Jakhari Space H on: 06/19/2016 10:19 AM   Modules accepted: Orders

## 2016-06-19 NOTE — Assessment & Plan Note (Signed)
After obtaining informed consent, the vaccine is  administered by LPN.  

## 2016-06-19 NOTE — Assessment & Plan Note (Signed)

## 2016-06-19 NOTE — Progress Notes (Signed)
    Brittany Maxwell     MRN: [id number]      DOB: 07/17/1966  HPI: Patient is in for annual physical exam. No other health concerns are expressed or addressed at the visit. Recent labs, if available are reviewed. Immunization is reviewed , and  updated if needed.   PE: Pleasant  female, alert and oriented x 3, in no cardio-pulmonary distress. Afebrile. HEENT No facial trauma or asymetry. Sinuses non tender.  Extra occullar muscles intact, pupils equally reactive to light. External ears normal, tympanic membranes clear. Oropharynx moist, no exudate. Neck: supple, no adenopathy,JVD or thyromegaly.No bruits.  Chest: Clear to ascultation bilaterally.No crackles or wheezes. Non tender to palpation  Breast: No asymetry,no masses or lumps. No tenderness. No nipple discharge or inversion. No axillary or supraclavicular adenopathy  Cardiovascular system; Heart sounds normal,  S1 and  S2 ,no S3.  No murmur, or thrill. Apical beat not displaced Peripheral pulses normal.  Abdomen: Soft, non tender, no organomegaly or masses. No bruits. Bowel sounds normal. No guarding, tenderness or rebound.  Rectal:  Normal sphincter tone. No rectal mass. Guaiac negative stool.  GU: External genitalia normal female genitalia , normal female distribution of hair. No lesions. Urethral meatus normal in size, no  Prolapse, no lesions visibly  Present. Bladder non tender. Vagina pink and moist , with no visible lesions , discharge present . Adequate pelvic support no  cystocele or rectocele noted Cervix pink and appears healthy, no lesions or ulcerations noted, no discharge noted from os Uterus normal size, no adnexal masses, no cervical motion or adnexal tenderness.   Musculoskeletal exam: Full ROM of spine, hips , shoulders and knees. No deformity ,swelling or crepitus noted. No muscle wasting or atrophy.   Neurologic: Cranial nerves 2 to 12 intact. Power, tone ,sensation and reflexes  normal throughout. No disturbance in gait. No tremor.  Skin: Intact, no ulceration, erythema , scaling or rash noted. Pigmentation normal throughout  Psych; Normal mood and affect. Judgement and concentration normal   Assessment & Plan:  Annual physical exam Annual exam as documented. Counseling done  re healthy lifestyle involving commitment to 150 minutes exercise per week, heart healthy diet, and attaining healthy weight.The importance of adequate sleep also discussed. Regular seat belt use and home safety, is also discussed. Changes in health habits are decided on by the patient with goals and time frames  set for achieving them. Immunization and cancer screening needs are specifically addressed at this visit.   Need for prophylactic vaccination and inoculation against influenza After obtaining informed consent, the vaccine is  administered by LPN.

## 2016-06-19 NOTE — Patient Instructions (Signed)
wellneess visit mid February, call if you need me sooner  Flu vaccine today  Labs today  Need colonoscopy, pls schedule and get this, you are referred   Please work on good  health habits so that your health will improve. 1. Commitment to daily physical activity for 30 to 60  minutes, if you are able to do this.  2. Commitment to wise food choices. Aim for half of your  food intake to be vegetable and fruit, one quarter starchy foods, and one quarter protein. Try to eat on a regular schedule  3 meals per day, snacking between meals should be limited to vegetables or fruits or small portions of nuts. 64 ounces of water per day is generally recommended, unless you have specific health conditions, like heart failure or kidney failure where you will need to limit fluid intake.  3. Commitment to sufficient and a  good quality of physical and mental rest daily, generally between 6 to 8 hours per day.  WITH PERSISTANCE AND PERSEVERANCE, THE IMPOSSIBLE , BECOMES THE NORM! Thank you  for choosing [facility]. We consider it a privelige to serve you.  Delivering excellent health care in a caring and  compassionate way is our goal.  Partnering with you,  so that together we can achieve this goal is our strategy.

## 2016-06-20 LAB — LIPID PANEL
Cholesterol: 154 mg/dL (ref 125–200)
HDL: 47 mg/dL (ref >=46)
LDL Cholesterol: 68 mg/dL (ref <130)
Total CHOL/HDL Ratio: 3.3 Ratio (ref <=5.0)
Triglycerides: 194 mg/dL — ABNORMAL HIGH (ref <150)
VLDL: 39 mg/dL — ABNORMAL HIGH (ref <30)

## 2016-06-20 LAB — COMPREHENSIVE METABOLIC PANEL
ALK PHOS: 66 U/L (ref 33–130)
ALT: 19 U/L (ref 6–29)
AST: 19 U/L (ref 10–35)
Albumin: 4 g/dL (ref 3.6–5.1)
BILIRUBIN TOTAL: 0.5 mg/dL (ref 0.2–1.2)
BUN: 15 mg/dL (ref 7–25)
CO2: 26 mmol/L (ref 20–31)
CREATININE: 0.89 mg/dL (ref 0.50–1.05)
Calcium: 9.7 mg/dL (ref 8.6–10.4)
Chloride: 102 mmol/L (ref 98–110)
Glucose, Bld: 96 mg/dL (ref 65–99)
Potassium: 3.5 mmol/L (ref 3.5–5.3)
SODIUM: 142 mmol/L (ref 135–146)
TOTAL PROTEIN: 7.2 g/dL (ref 6.1–8.1)

## 2016-06-20 LAB — VITAMIN D 25 HYDROXY (VIT D DEFICIENCY, FRACTURES): Vit D, 25-Hydroxy: 19 ng/mL — ABNORMAL LOW (ref 30–100)

## 2016-06-20 LAB — HIV ANTIBODY (ROUTINE TESTING W REFLEX): HIV: NONREACTIVE

## 2016-06-20 LAB — IRON,TIBC AND FERRITIN PANEL
%SAT: 25 % (ref 11–50)
Ferritin: 29 ng/mL (ref 10–232)
Iron: 79 ug/dL (ref 45–160)
TIBC: 316 ug/dL (ref 250–450)

## 2016-06-20 LAB — HEMOGLOBIN A1C
HEMOGLOBIN A1C: 6.3 % — AB (ref <5.7)
MEAN PLASMA GLUCOSE: 134 mg/dL

## 2016-06-20 LAB — T3, FREE: T3, Free: 4.1 pg/mL (ref 2.3–4.2)

## 2016-06-20 LAB — T4, FREE: Free T4: 1.9 ng/dL — ABNORMAL HIGH (ref 0.8–1.8)

## 2016-06-21 ENCOUNTER — Encounter: Payer: Medicare Other | Admitting: Family Medicine

## 2016-06-21 ENCOUNTER — Other Ambulatory Visit: Payer: Self-pay | Admitting: Family Medicine

## 2016-06-21 DIAGNOSIS — R946 Abnormal results of thyroid function studies: Secondary | ICD-10-CM

## 2016-06-21 DIAGNOSIS — E049 Nontoxic goiter, unspecified: Secondary | ICD-10-CM

## 2016-06-23 ENCOUNTER — Other Ambulatory Visit: Payer: Self-pay

## 2016-06-23 ENCOUNTER — Telehealth: Payer: Self-pay

## 2016-06-23 DIAGNOSIS — E785 Hyperlipidemia, unspecified: Secondary | ICD-10-CM

## 2016-06-23 DIAGNOSIS — E559 Vitamin D deficiency, unspecified: Secondary | ICD-10-CM

## 2016-06-23 DIAGNOSIS — E1169 Type 2 diabetes mellitus with other specified complication: Secondary | ICD-10-CM

## 2016-06-23 DIAGNOSIS — E669 Obesity, unspecified: Secondary | ICD-10-CM

## 2016-06-23 DIAGNOSIS — I10 Essential (primary) hypertension: Secondary | ICD-10-CM

## 2016-06-23 MED ORDER — VITAMIN D (ERGOCALCIFEROL) 1.25 MG (50000 UNIT) PO CAPS: 50000.0000 [IU] | ORAL_CAPSULE | ORAL | 5 refills | Status: DC

## 2016-06-23 NOTE — Telephone Encounter (Signed)
-----  Message from Fayrene Helper, MD sent at 06/21/2016  4:44 PM EDT ----- Please  advise vit D is low, and erx Vit D3 50,000 IU once weekly #4 refill 5, and let pt know .    Pls explain thyroid function is abnormal, seems  To be overactive. I am referring her for [place] of thyroid gland and to Dr Dorris Fetch ([facility]) to further eval and treat as necessary, I am entering both referrals Blood sugar inc needs to reduce sweets and carbs  tG still elevated   Needs vit D, hBa1C, fasting ;lipid , cmp and eGFR in 4 month

## 2016-06-26 ENCOUNTER — Ambulatory Visit (HOSPITAL_COMMUNITY): Admission: RE | Admit: 2016-06-26 | Payer: Medicare Other | Source: Ambulatory Visit

## 2016-06-27 ENCOUNTER — Ambulatory Visit (HOSPITAL_COMMUNITY)
Admission: RE | Admit: 2016-06-27 | Discharge: 2016-06-27 | Disposition: A | Discharge status: Home / Self Care | Payer: BLUE CROSS/BLUE SHIELD | Source: Ambulatory Visit | Attending: Family Medicine | Admitting: Family Medicine

## 2016-06-27 DIAGNOSIS — E049 Nontoxic goiter, unspecified: Secondary | ICD-10-CM

## 2016-06-27 DIAGNOSIS — R946 Abnormal results of thyroid function studies: Secondary | ICD-10-CM | POA: Diagnosis not present

## 2016-06-27 DIAGNOSIS — E042 Nontoxic multinodular goiter: Secondary | ICD-10-CM | POA: Insufficient documentation

## 2016-07-12 ENCOUNTER — Encounter: Payer: Self-pay | Admitting: "Endocrinology

## 2016-07-12 ENCOUNTER — Ambulatory Visit (INDEPENDENT_AMBULATORY_CARE_PROVIDER_SITE_OTHER): Payer: Medicare Other | Admitting: "Endocrinology

## 2016-07-12 VITALS — BP 129/85 | HR 71 | Ht 63.0 in | Wt 213.0 lb

## 2016-07-12 DIAGNOSIS — E1169 Type 2 diabetes mellitus with other specified complication: Secondary | ICD-10-CM

## 2016-07-12 DIAGNOSIS — E669 Obesity, unspecified: Secondary | ICD-10-CM

## 2016-07-12 DIAGNOSIS — I10 Essential (primary) hypertension: Secondary | ICD-10-CM | POA: Diagnosis not present

## 2016-07-12 DIAGNOSIS — E0590 Thyrotoxicosis, unspecified without thyrotoxic crisis or storm: Secondary | ICD-10-CM

## 2016-07-12 DIAGNOSIS — E559 Vitamin D deficiency, unspecified: Secondary | ICD-10-CM | POA: Diagnosis not present

## 2016-07-12 DIAGNOSIS — E7800 Pure hypercholesterolemia, unspecified: Secondary | ICD-10-CM | POA: Diagnosis not present

## 2016-07-12 DIAGNOSIS — IMO0001 Reserved for inherently not codable concepts without codable children: Secondary | ICD-10-CM

## 2016-07-12 MED ORDER — METFORMIN HCL 500 MG PO TABS: 500.0000 mg | ORAL_TABLET | Freq: Two times a day (BID) | ORAL | 3 refills | Status: DC

## 2016-07-12 NOTE — Progress Notes (Signed)
Subjective:    Patient ID: Brittany Maxwell, female    DOB: 03/28/1966, PCP Syliva Overman, MD   Past Medical History:  Diagnosis Date  . Arthritis   . Diabetes mellitus without complication (HCC)   . Hyperlipidemia   . Hypertension    Past Surgical History:  Procedure Laterality Date  . CHOLECYSTECTOMY    . TUBAL LIGATION     Social History   Social History  . Marital status: Married    Spouse name: N/A  . Number of children: N/A  . Years of education: N/A   Social History Main Topics  . Smoking status: Never Smoker  . Smokeless tobacco: None  . Alcohol use No  . Drug use: No  . Sexual activity: No   Other Topics Concern  . None   Social History Narrative  . None   Outpatient Encounter Prescriptions as of 07/12/2016  Medication Sig  . acetaminophen (TYLENOL) 500 MG tablet 500 mg as needed.    Marland Kitchen amLODipine (NORVASC) 10 MG tablet TAKE ONE TABLET BY MOUTH ONCE DAILY  . DULoxetine (CYMBALTA) 60 MG capsule Take 60 mg by mouth every other day.  . metFORMIN (GLUCOPHAGE) 500 MG tablet Take 1 tablet (500 mg total) by mouth 2 (two) times daily with a meal.  . Multiple Vitamin (MULTIVITAMIN) capsule Take 1 capsule by mouth daily.    . potassium chloride (K-DUR) 10 MEQ tablet Take 1 tablet (10 mEq total) by mouth 2 (two) times daily.  . pravastatin (PRAVACHOL) 20 MG tablet TAKE ONE TABLET BY MOUTH ONCE DAILY  . triamterene-hydrochlorothiazide (MAXZIDE) 75-50 MG tablet TAKE ONE TABLET BY MOUTH ONCE DAILY  . Vitamin D, Ergocalciferol, (DRISDOL) 50000 units CAPS capsule Take 1 capsule (50,000 Units total) by mouth every 7 (seven) days.  . [DISCONTINUED] metFORMIN (GLUCOPHAGE) 500 MG tablet TAKE ONE TABLET BY MOUTH THREE TIMES DAILY WITH MEALS   No facility-administered encounter medications on file as of 07/12/2016.    ALLERGIES: Allergies  Allergen Reactions  . Ace Inhibitors Cough  . Meloxicam Nausea Only   VACCINATION STATUS: Immunization History  Administered  Date(s) Administered  . Influenza Split 08/28/2011  . Influenza Whole 07/06/2009  . Influenza,inj,Quad PF,36+ Mos 07/01/2013, 09/18/2014, 11/11/2015, 06/19/2016  . Pneumococcal Polysaccharide-23 07/01/2013  . Tdap 08/28/2011    HPI 50 year old female patient with medical history as above. She is being seen in consultation for type 2 diabetes and abnormal thyroid function tests requested by Dr. Syliva Overman. -She was diagnosed with type 2 diabetes at age 27 and currently on metformin 500 mg by mouth 3 times a day. She was found to have elevated free T4 and suppressed TSH the middle of last month. She denies weight loss, palpitations, tremors, heat/cold intolerance. She denies family history of thyroid dysfunction. -She denies any prior history of goiter nor exposure to thyroid hormones.   Review of Systems Constitutional: no weight gain/loss, no fatigue, no subjective hyperthermia/hypothermia Eyes: no blurry vision, no xerophthalmia ENT: no sore throat, no nodules palpated in throat, no dysphagia/odynophagia, no hoarseness Cardiovascular: no CP/SOB/palpitations/leg swelling Respiratory: no cough/SOB Gastrointestinal: no N/V/D/C Musculoskeletal: no muscle/joint aches Skin: no rashes Neurological: no tremors/numbness/tingling/dizziness Psychiatric: no depression/anxiety  Objective:    BP 129/85   Pulse 71   Ht 5\' 3"  (1.6 m)   Wt 213 lb (96.6 kg)   BMI 37.73 kg/m   Wt Readings from Last 3 Encounters:  07/12/16 213 lb (96.6 kg)  06/19/16 211 lb 6.4 oz (95.9 kg)  11/11/15  209 lb (94.8 kg)    Physical Exam  Constitutional: overweight, in NAD Eyes: PERRLA, EOMI, no exophthalmos ENT: moist mucous membranes, + thyromegaly, no cervical lymphadenopathy Cardiovascular: RRR, No MRG Respiratory: CTA B Gastrointestinal: abdomen soft, NT, ND, BS+ Musculoskeletal: no deformities, strength intact in all 4 Skin: moist, warm, no rashes Neurological: no tremor with outstretched hands,  DTR normal in all 4  CMP     Component Value Date/Time   NA 142 06/19/2016 0949   K 3.5 06/19/2016 0949   CL 102 06/19/2016 0949   CO2 26 06/19/2016 0949   GLUCOSE 96 06/19/2016 0949   BUN 15 06/19/2016 0949   CREATININE 0.89 06/19/2016 0949   CALCIUM 9.7 06/19/2016 0949   PROT 7.2 06/19/2016 0949   ALBUMIN 4.0 06/19/2016 0949   AST 19 06/19/2016 0949   ALT 19 06/19/2016 0949   ALKPHOS 66 06/19/2016 0949   BILITOT 0.5 06/19/2016 0949   GFRNONAA 65 11/09/2015 0813   GFRAA 75 11/09/2015 0813     Diabetic Labs (most recent): Lab Results  Component Value Date   HGBA1C 6.3 (H) 06/19/2016   HGBA1C 6.2 (H) 11/09/2015   HGBA1C 6.6 (H) 08/07/2015     Lipid Panel ( most recent) Lipid Panel     Component Value Date/Time   CHOL 154 06/19/2016 0949   TRIG 194 (H) 06/19/2016 0949   HDL 47 06/19/2016 0949   CHOLHDL 3.3 06/19/2016 0949   VLDL 39 (H) 06/19/2016 0949   LDLCALC 68 06/19/2016 0949     Thyroid/neck ultrasound from 06/27/2016 showed multinodular goiter. The right lobe 5.1x1.9x1.4 cm with no nodules.   enlarged  nodule on the isthmus at 2 cm previously 1.3 cm,  Left lobe: 6.0x 1.8cmx1.8cm with 1.1 cm, and 1.2cm  nodules  Results for Brittany FlorenceBLACKWELL, [place] H (MRN [id number]) as of 07/12/2016 08:57  Ref. Range 06/19/2016 09:49  TSH Latest Units: mIU/L 0.03 (L)  Triiodothyronine,Free,Serum Latest Ref Range: 2.3 - 4.2 pg/mL 4.1  T4,Free(Direct) Latest Ref Range: 0.8 - 1.8 ng/dL 1.9 (H)     Assessment & Plan:   1. Diabetes mellitus type 2 in obese (HCC) - Controlled with A1c of 6.3%. Dietary recommendations and exercise regimen discussed with her. I will lower her metformin to 500 mg by mouth twice a day.  2. Toxic Multinodular goiter: - I have reviewed her available records on  thyroid workup. She does not have significant symptoms for hyperthyroidism, however, She has elevated thyroid hormones suggesting toxic multinodular goiter. She will need thyroid uptake and scan to  study further. - If the study suggests significant cold nodule, she will be considered for fine needle aspiration. -If not she'll be considered for definitive therapy for hyperthyroidism, options include radioactive iodine thyroid ablation.  3. Essential hypertension, benign - Controlled. Continue current medications and follow up with Dr. Lodema HongSimpson.  4. Obesity, Class II, BMI 35.0-39.9, with comorbidity  - Consult with CDE has been initiated.  4. Hypercholesteremia -  Controlled, advised her to continue pravastatin 20 mg by mouth daily at bedtime.  5. Vitamin D insufficiency - She is on vitamin D supplement 50,000 units weekly. - I advised patient to maintain close follow up with Syliva OvermanMargaret Simpson, MD for primary care needs. Follow up plan: Return in about 2 weeks (around 07/26/2016) for thyroid uptake and scan.  Marquis LunchGebre Maxmillian Carsey, MD Phone: [phone]  Fax: [phone]   07/12/2016, 9:32 AM

## 2016-07-12 NOTE — Patient Instructions (Signed)

## 2016-07-17 ENCOUNTER — Encounter (HOSPITAL_COMMUNITY): Payer: BLUE CROSS/BLUE SHIELD

## 2016-07-18 ENCOUNTER — Encounter (HOSPITAL_COMMUNITY): Payer: BLUE CROSS/BLUE SHIELD

## 2016-07-26 ENCOUNTER — Encounter (HOSPITAL_COMMUNITY)
Admission: RE | Admit: 2016-07-26 | Discharge: 2016-07-26 | Disposition: A | Discharge status: Home / Self Care | Payer: BLUE CROSS/BLUE SHIELD | Source: Ambulatory Visit | Attending: "Endocrinology | Admitting: "Endocrinology

## 2016-07-26 ENCOUNTER — Encounter (HOSPITAL_COMMUNITY): Payer: Self-pay

## 2016-07-26 DIAGNOSIS — E0590 Thyrotoxicosis, unspecified without thyrotoxic crisis or storm: Secondary | ICD-10-CM

## 2016-07-26 MED ORDER — SODIUM IODIDE I-123 7.4 MBQ CAPS
400.0000 | ORAL_CAPSULE | Freq: Once | ORAL | Status: AC
  Administered 2016-07-26: 376 via ORAL

## 2016-07-27 ENCOUNTER — Ambulatory Visit (HOSPITAL_COMMUNITY)
Admission: RE | Admit: 2016-07-27 | Discharge: 2016-07-27 | Disposition: A | Discharge status: Home / Self Care | Payer: BLUE CROSS/BLUE SHIELD | Source: Ambulatory Visit | Attending: "Endocrinology | Admitting: "Endocrinology

## 2016-07-27 DIAGNOSIS — E0520 Thyrotoxicosis with toxic multinodular goiter without thyrotoxic crisis or storm: Secondary | ICD-10-CM | POA: Diagnosis not present

## 2016-07-27 DIAGNOSIS — E0590 Thyrotoxicosis, unspecified without thyrotoxic crisis or storm: Secondary | ICD-10-CM | POA: Diagnosis not present

## 2016-08-11 ENCOUNTER — Encounter: Payer: Self-pay | Admitting: "Endocrinology

## 2016-08-11 ENCOUNTER — Ambulatory Visit (INDEPENDENT_AMBULATORY_CARE_PROVIDER_SITE_OTHER): Payer: BLUE CROSS/BLUE SHIELD | Admitting: "Endocrinology

## 2016-08-11 VITALS — BP 151/99 | HR 77 | Ht 63.0 in | Wt 212.0 lb

## 2016-08-11 DIAGNOSIS — I10 Essential (primary) hypertension: Secondary | ICD-10-CM | POA: Diagnosis not present

## 2016-08-11 DIAGNOSIS — E669 Obesity, unspecified: Secondary | ICD-10-CM

## 2016-08-11 DIAGNOSIS — E7800 Pure hypercholesterolemia, unspecified: Secondary | ICD-10-CM

## 2016-08-11 DIAGNOSIS — E0590 Thyrotoxicosis, unspecified without thyrotoxic crisis or storm: Secondary | ICD-10-CM | POA: Diagnosis not present

## 2016-08-11 DIAGNOSIS — E0500 Thyrotoxicosis with diffuse goiter without thyrotoxic crisis or storm: Secondary | ICD-10-CM

## 2016-08-11 DIAGNOSIS — E1169 Type 2 diabetes mellitus with other specified complication: Secondary | ICD-10-CM | POA: Diagnosis not present

## 2016-08-11 NOTE — Progress Notes (Signed)
Subjective:    Patient ID: Brittany Maxwell, female    DOB: 03/07/1966, PCP Syliva OvermanMargaret Simpson, MD   Past Medical History:  Diagnosis Date  . Arthritis   . Diabetes mellitus without complication (HCC)   . Hyperlipidemia   . Hypertension    Past Surgical History:  Procedure Laterality Date  . CHOLECYSTECTOMY    . TUBAL LIGATION     Social History   Social History  . Marital status: Married    Spouse name: N/A  . Number of children: N/A  . Years of education: N/A   Social History Main Topics  . Smoking status: Never Smoker  . Smokeless tobacco: Never Used  . Alcohol use No  . Drug use: No  . Sexual activity: No   Other Topics Concern  . None   Social History Narrative  . None   Outpatient Encounter Prescriptions as of 08/11/2016  Medication Sig  . acetaminophen (TYLENOL) 500 MG tablet 500 mg as needed.    Marland Kitchen. amLODipine (NORVASC) 10 MG tablet TAKE ONE TABLET BY MOUTH ONCE DAILY  . DULoxetine (CYMBALTA) 60 MG capsule Take 60 mg by mouth every other day.  . metFORMIN (GLUCOPHAGE) 500 MG tablet Take 1 tablet (500 mg total) by mouth 2 (two) times daily with a meal.  . Multiple Vitamin (MULTIVITAMIN) capsule Take 1 capsule by mouth daily.    . potassium chloride (K-DUR) 10 MEQ tablet Take 1 tablet (10 mEq total) by mouth 2 (two) times daily.  . pravastatin (PRAVACHOL) 20 MG tablet TAKE ONE TABLET BY MOUTH ONCE DAILY  . triamterene-hydrochlorothiazide (MAXZIDE) 75-50 MG tablet TAKE ONE TABLET BY MOUTH ONCE DAILY  . Vitamin D, Ergocalciferol, (DRISDOL) 50000 units CAPS capsule Take 1 capsule (50,000 Units total) by mouth every 7 (seven) days.   No facility-administered encounter medications on file as of 08/11/2016.    ALLERGIES: Allergies  Allergen Reactions  . Ace Inhibitors Cough  . Meloxicam Nausea Only   VACCINATION STATUS: Immunization History  Administered Date(s) Administered  . Influenza Split 08/28/2011  . Influenza Whole 07/06/2009  .  Influenza,inj,Quad PF,36+ Mos 07/01/2013, 09/18/2014, 11/11/2015, 06/19/2016  . Pneumococcal Polysaccharide-23 07/01/2013  . Tdap 08/28/2011    HPI [age] year old female patient with medical history as above. She is being seen in consultation for type 2 diabetes and abnormal thyroid function tests requested by Dr. Syliva OvermanMargaret Simpson. -She was diagnosed with type 2 diabetes at age 50 and currently on metformin 500 mg by mouth 3 times a day.   She was found to have elevated free T4 and suppressed TSH the middle of last month.  She denies recent  weight loss, palpitations, tremors, heat/cold intolerance.  - However progressively over the last year she lost approximately 20 pounds, and reports sleep disturbance. - Her thyroid uptake and scan last week showed uniform uptake of 65% consistent with Graves' disease. - She denies family history of thyroid dysfunction. -She denies any prior history of goiter nor exposure to thyroid hormones.   Review of Systems Constitutional: no weight gain/loss, no fatigue, no subjective hyperthermia/hypothermia Eyes: no blurry vision, no xerophthalmia ENT: no sore throat, no nodules palpated in throat, no dysphagia/odynophagia, no hoarseness Cardiovascular: no CP/SOB/palpitations/leg swelling Respiratory: no cough/SOB Gastrointestinal: no N/V/D/C Musculoskeletal: no muscle/joint aches Skin: no rashes Neurological: no tremors/numbness/tingling/dizziness Psychiatric: no depression/anxiety  Objective:    BP (!) 151/99   Pulse 77   Ht 5\' 3"  (1.6 m)   Wt 212 lb (96.2 kg)   BMI 37.55 kg/m  Wt Readings from Last 3 Encounters:  08/11/16 212 lb (96.2 kg)  07/12/16 213 lb (96.6 kg)  06/19/16 211 lb 6.4 oz (95.9 kg)    Physical Exam  Constitutional: overweight, in NAD Eyes: PERRLA, EOMI, no exophthalmos ENT: moist mucous membranes, + thyromegaly, no cervical lymphadenopathy Cardiovascular: RRR, No MRG Respiratory: CTA B Gastrointestinal: abdomen soft, NT,  ND, BS+ Musculoskeletal: no deformities, strength intact in all 4 Skin: moist, warm, no rashes Neurological: no tremor with outstretched hands, DTR normal in all 4  CMP     Component Value Date/Time   NA 142 06/19/2016 0949   K 3.5 06/19/2016 0949   CL 102 06/19/2016 0949   CO2 26 06/19/2016 0949   GLUCOSE 96 06/19/2016 0949   BUN 15 06/19/2016 0949   CREATININE 0.89 06/19/2016 0949   CALCIUM 9.7 06/19/2016 0949   PROT 7.2 06/19/2016 0949   ALBUMIN 4.0 06/19/2016 0949   AST 19 06/19/2016 0949   ALT 19 06/19/2016 0949   ALKPHOS 66 06/19/2016 0949   BILITOT 0.5 06/19/2016 0949   GFRNONAA 65 11/09/2015 0813   GFRAA 75 11/09/2015 0813     Diabetic Labs (most recent): Lab Results  Component Value Date   HGBA1C 6.3 (H) 06/19/2016   HGBA1C 6.2 (H) 11/09/2015   HGBA1C 6.6 (H) 08/07/2015     Lipid Panel ( most recent) Lipid Panel     Component Value Date/Time   CHOL 154 06/19/2016 0949   TRIG 194 (H) 06/19/2016 0949   HDL 47 06/19/2016 0949   CHOLHDL 3.3 06/19/2016 0949   VLDL 39 (H) 06/19/2016 0949   LDLCALC 68 06/19/2016 0949     Thyroid/neck ultrasound from 06/27/2016 showed multinodular goiter. The right lobe 5.1x1.9x1.4 cm with no nodules.   enlarged  nodule on the isthmus at 2 cm previously 1.3 cm,  Left lobe: 6.0x 1.8cmx1.8cm with 1.1 cm, and 1.2cm  nodules  Results for ZIPPORAH, FINAMORE (MRN [id number]) as of 07/12/2016 08:57  Ref. Range 06/19/2016 09:49  TSH Latest Units: mIU/L 0.03 (L)  Triiodothyronine,Free,Serum Latest Ref Range: 2.3 - 4.2 pg/mL 4.1  T4,Free(Direct) Latest Ref Range: 0.8 - 1.8 ng/dL 1.9 (H)   - Thyroid uptake and scan from 07/27/2016 showed uniform uptake of 65% suggesting Graves' disease.  Assessment & Plan:   1. Diabetes mellitus type 2 in obese (HCC) - Controlled with A1c of 6.3%. Dietary recommendations and exercise regimen discussed with her. I will Continue metformin  500 mg by mouth twice a day.  2. Hyperthyroidism from  Graves' disease/Toxic Multinodular goiter: - She has had elevated free T4 associated with suppressed TSH and thyroid uptake and scan suggestive of hyperthyroidism from Graves' disease. - Treatment options were discussed with her. The best therapy of option for her would be radioactive iodine ablation of the thyroid. She agrees with plan.  - This therapy will be scheduled to be administered as soon as possible. - She understands the subsequent need for thyroid hormone replacement.  - She will return in 9 weeks with repeat thyroid function test.  3. Essential hypertension, benign - Controlled. Continue current medications and follow up with Dr. Lodema Hong.  4. Obesity, Class II, BMI 35.0-39.9, with comorbidity  - Consult with CDE has been initiated.  4. Hypercholesteremia -  Controlled, advised her to continue pravastatin 20 mg by mouth daily at bedtime.  5. Vitamin D insufficiency - She is on vitamin D supplement 50,000 units weekly. - I advised patient to maintain close follow up with Syliva Overman,  MD for primary care needs.  Follow up plan: Return in about 9 weeks (around 10/13/2016) for with labs after I131 therapy.  Marquis LunchGebre Nida, MD Phone: [phone]  Fax: [phone]   08/11/2016, 10:40 AM

## 2016-08-15 ENCOUNTER — Other Ambulatory Visit: Payer: Self-pay | Admitting: "Endocrinology

## 2016-08-15 DIAGNOSIS — E049 Nontoxic goiter, unspecified: Secondary | ICD-10-CM

## 2016-08-18 ENCOUNTER — Telehealth: Payer: Self-pay

## 2016-08-18 NOTE — Telephone Encounter (Signed)
Pt notified of Thyroid biopsy appt

## 2016-08-30 ENCOUNTER — Ambulatory Visit (HOSPITAL_COMMUNITY): Payer: BLUE CROSS/BLUE SHIELD

## 2016-09-01 ENCOUNTER — Ambulatory Visit (HOSPITAL_COMMUNITY): Payer: BLUE CROSS/BLUE SHIELD

## 2016-09-06 ENCOUNTER — Ambulatory Visit (HOSPITAL_COMMUNITY)
Admission: RE | Admit: 2016-09-06 | Discharge: 2016-09-06 | Disposition: A | Discharge status: Home / Self Care | Payer: BLUE CROSS/BLUE SHIELD | Source: Ambulatory Visit | Attending: "Endocrinology | Admitting: "Endocrinology

## 2016-09-06 DIAGNOSIS — E049 Nontoxic goiter, unspecified: Secondary | ICD-10-CM | POA: Insufficient documentation

## 2016-09-06 DIAGNOSIS — E041 Nontoxic single thyroid nodule: Secondary | ICD-10-CM | POA: Diagnosis not present

## 2016-09-06 MED ORDER — LIDOCAINE HCL (PF) 2 % IJ SOLN
INTRAMUSCULAR | Status: AC
Start: 2016-09-06 — End: 2016-09-06
  Filled 2016-09-06: qty 10

## 2016-09-06 NOTE — Discharge Instructions (Signed)
Thyroid Biopsy °The thyroid gland is a butterfly-shaped gland located in the front of the neck. It produces hormones that affect metabolism, growth and development, and body temperature. Thyroid biopsy is a procedure in which small samples of tissue or fluid are removed from the thyroid gland. The samples are then looked at under a microscope to check for abnormalities. This procedure is done to determine the cause of thyroid problems. It may be done to check for infection, cancer, or other thyroid problems. °Two methods may be used for a thyroid biopsy. In one method, a thin needle is inserted through the skin and into the thyroid gland. In the other method, an open incision is made through the skin. °Tell a health care provider about: °· Any allergies you have. °· All medicines you are taking, including vitamins, herbs, eye drops, creams, and over-the-counter medicines. °· Any problems you or family members have had with anesthetic medicines. °· Any blood disorders you have. °· Any surgeries you have had. °· Any medical conditions you have. °What are the risks? °Generally, this is a safe procedure. However, problems can occur and include: °· Bleeding from the procedure site. °· Infection. °· Injury to structures near the thyroid gland. °What happens before the procedure? °· Ask your health care provider about: °¨ Changing or stopping your regular medicines. This is especially important if you are taking diabetes medicines or blood thinners. °¨ Taking medicines such as aspirin and ibuprofen. These medicines can thin your blood. Do not take these medicines before your procedure if your health care provider asks you not to. °· Do not eat or drink anything after midnight on the night before the procedure or as directed by your health care provider. °· You may have a blood sample taken. °What happens during the procedure? °Either of these methods may be used to perform a thyroid biopsy: °· Fine needle biopsy. You may  be given medicine to help you relax (sedative). You will be asked to lie on your back with your head tipped backward to extend your neck. An area on your neck will be cleaned. A needle will then be inserted through the skin of your neck. You may be asked to avoid coughing, talking, swallowing, or making sounds during some portions of the procedure. The needle will be withdrawn once the tissue or fluid samples have been removed. Pressure may be applied to your neck to reduce swelling and ensure that bleeding has stopped. The samples will be sent to a lab for examination. °· Open biopsy. You will be given medicine to make you sleep (general anesthetic). An incision will be made in your neck. A sample of thyroid tissue will be removed using surgical tools. The tissue sample will be sent for examination. In some cases, the sample may be examined during the biopsy. If that is done and cancer cells are found, some or all of the thyroid gland may be removed. The incision will be closed with stitches. °What happens after the procedure? °· Your recovery will be assessed and monitored. °· You may have soreness and tenderness at the site of the biopsy. This should go away after a few days. °· If you had an open biopsy, you may have a hoarse voice or sore throat for a couple days. °· It is your responsibility to get your test results. °This information is not intended to replace advice given to you by your health care provider. Make sure you discuss any questions you have with your health   care provider. °Document Released: 07/16/2007 Document Revised: 05/21/2016 Document Reviewed: 12/11/2013 °Elsevier Interactive Patient Education © 2017 [employer] ° °

## 2016-10-10 ENCOUNTER — Ambulatory Visit: Payer: BLUE CROSS/BLUE SHIELD | Admitting: "Endocrinology

## 2016-11-02 ENCOUNTER — Other Ambulatory Visit: Payer: Self-pay | Admitting: Family Medicine

## 2016-11-13 ENCOUNTER — Ambulatory Visit: Payer: Self-pay | Admitting: "Endocrinology

## 2016-11-16 DIAGNOSIS — E669 Obesity, unspecified: Secondary | ICD-10-CM | POA: Diagnosis not present

## 2016-11-16 DIAGNOSIS — E559 Vitamin D deficiency, unspecified: Secondary | ICD-10-CM | POA: Diagnosis not present

## 2016-11-16 DIAGNOSIS — E785 Hyperlipidemia, unspecified: Secondary | ICD-10-CM | POA: Diagnosis not present

## 2016-11-16 DIAGNOSIS — E119 Type 2 diabetes mellitus without complications: Secondary | ICD-10-CM | POA: Diagnosis not present

## 2016-11-16 LAB — COMPLETE METABOLIC PANEL WITH GFR
ALT: 14 U/L (ref 6–29)
AST: 15 U/L (ref 10–35)
Albumin: 4 g/dL (ref 3.6–5.1)
Alkaline Phosphatase: 73 U/L (ref 33–130)
BUN: 13 mg/dL (ref 7–25)
CALCIUM: 9.2 mg/dL (ref 8.6–10.4)
CHLORIDE: 101 mmol/L (ref 98–110)
CO2: 30 mmol/L (ref 20–31)
Creat: 1.1 mg/dL — ABNORMAL HIGH (ref 0.50–1.05)
GFR, EST NON AFRICAN AMERICAN: 58 mL/min — AB (ref >=60)
GFR, Est African American: 67 mL/min (ref >=60)
Glucose, Bld: 91 mg/dL (ref 65–99)
POTASSIUM: 3.2 mmol/L — AB (ref 3.5–5.3)
Sodium: 140 mmol/L (ref 135–146)
Total Bilirubin: 0.5 mg/dL (ref 0.2–1.2)
Total Protein: 7.5 g/dL (ref 6.1–8.1)

## 2016-11-16 LAB — LIPID PANEL
CHOL/HDL RATIO: 3.3 ratio (ref <5.0)
Cholesterol: 163 mg/dL (ref <200)
HDL: 50 mg/dL — ABNORMAL LOW (ref >50)
LDL CALC: 84 mg/dL (ref <100)
Triglycerides: 143 mg/dL (ref <150)
VLDL: 29 mg/dL (ref <30)

## 2016-11-17 LAB — HEMOGLOBIN A1C
HEMOGLOBIN A1C: 5.9 % — AB (ref <5.7)
MEAN PLASMA GLUCOSE: 123 mg/dL

## 2016-11-17 LAB — VITAMIN D 25 HYDROXY (VIT D DEFICIENCY, FRACTURES): VIT D 25 HYDROXY: 33 ng/mL (ref 30–100)

## 2016-11-20 ENCOUNTER — Other Ambulatory Visit: Payer: Self-pay

## 2016-11-20 MED ORDER — POTASSIUM CHLORIDE CRYS ER 20 MEQ PO TBCR: 20.0000 meq | EXTENDED_RELEASE_TABLET | Freq: Two times a day (BID) | ORAL | 3 refills | Status: DC

## 2016-11-21 ENCOUNTER — Ambulatory Visit: Payer: BLUE CROSS/BLUE SHIELD

## 2016-12-13 ENCOUNTER — Other Ambulatory Visit: Payer: Self-pay | Admitting: Family Medicine

## 2016-12-21 ENCOUNTER — Ambulatory Visit (INDEPENDENT_AMBULATORY_CARE_PROVIDER_SITE_OTHER): Payer: BLUE CROSS/BLUE SHIELD | Admitting: Family Medicine

## 2016-12-21 ENCOUNTER — Encounter: Payer: Self-pay | Admitting: Family Medicine

## 2016-12-21 ENCOUNTER — Other Ambulatory Visit (HOSPITAL_COMMUNITY)
Admission: RE | Admit: 2016-12-21 | Discharge: 2016-12-21 | Disposition: A | Discharge status: Home / Self Care | Payer: BLUE CROSS/BLUE SHIELD | Source: Ambulatory Visit | Attending: Family Medicine | Admitting: Family Medicine

## 2016-12-21 VITALS — BP 124/86 | HR 79 | Resp 15 | Ht 63.0 in | Wt 209.8 lb

## 2016-12-21 DIAGNOSIS — E669 Obesity, unspecified: Secondary | ICD-10-CM

## 2016-12-21 DIAGNOSIS — E1169 Type 2 diabetes mellitus with other specified complication: Secondary | ICD-10-CM

## 2016-12-21 DIAGNOSIS — Z1211 Encounter for screening for malignant neoplasm of colon: Secondary | ICD-10-CM | POA: Diagnosis not present

## 2016-12-21 DIAGNOSIS — Z0000 Encounter for general adult medical examination without abnormal findings: Secondary | ICD-10-CM

## 2016-12-21 DIAGNOSIS — Z1239 Encounter for other screening for malignant neoplasm of breast: Secondary | ICD-10-CM

## 2016-12-21 DIAGNOSIS — Z1231 Encounter for screening mammogram for malignant neoplasm of breast: Secondary | ICD-10-CM
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
## 2016-12-21 NOTE — Patient Instructions (Addendum)
Annual physical exam 09/19 or after, call if you need me sooner  Please schedule your mammogram , due  STOP weekly vitamin D once you finish current. New is once daily vit D3 1000 IU which is OTC  You are referred to Dr Karilyn Cota for colon screening  You will be referred for eye exam to Dr who takes your insurance  Excellent labs, blood pressure and foot exam  Urine being sent for annul testing today  PLEASE call in August for lab sheet for next visit  Thank you  for choosing  [facility]. We consider it a privelige to serve you.  Delivering excellent health care in a caring and  compassionate way is our goal.  Partnering with you,  so that together we can achieve this goal is our strategy.   Please work on good  health habits so that your health will improve. 1. Commitment to daily physical activity for 30 to 60  minutes, if you are able to do this.  2. Commitment to wise food choices. Aim for half of your  food intake to be vegetable and fruit, one quarter starchy foods, and one quarter protein. Try to eat on a regular schedule  3 meals per day, snacking between meals should be limited to vegetables or fruits or small portions of nuts. 64 ounces of water per day is generally recommended, unless you have specific health conditions, like heart failure or kidney failure where you will need to limit fluid intake.  3. Commitment to sufficient and a  good quality of physical and mental rest daily, generally between 6 to 8 hours per day.  WITH PERSISTANCE AND PERSEVERANCE, THE IMPOSSIBLE , BECOMES THE NORM!  Fall Prevention in the Home Falls can cause injuries. They can happen to people of all ages. There are many things you can do to make your home safe and to help prevent falls. What can I do on the outside of my home?  Regularly fix the edges of walkways and driveways and fix any cracks.  Remove anything that might make you trip as you walk through a door, such as a  raised step or threshold.  Trim any bushes or trees on the path to your home.  Use bright outdoor lighting.  Clear any walking paths of anything that might make someone trip, such as rocks or tools.  Regularly check to see if handrails are loose or broken. Make sure that both sides of any steps have handrails.  Any raised decks and porches should have guardrails on the edges.  Have any leaves, snow, or ice cleared regularly.  Use sand or salt on walking paths during winter.  Clean up any spills in your garage right away. This includes oil or grease spills. What can I do in the bathroom?  Use night lights.  Install grab bars by the toilet and in the tub and shower. Do not use towel bars as grab bars.  Use non-skid mats or decals in the tub or shower.  If you need to sit down in the shower, use a plastic, non-slip stool.  Keep the floor dry. Clean up any water that spills on the floor as soon as it happens.  Remove soap buildup in the tub or shower regularly.  Attach bath mats securely with double-sided non-slip rug tape.  Do not have throw rugs and other things on the floor that can make you trip. What can I do in the bedroom?  Use night lights.  Make  sure that you have a light by your bed that is easy to reach.  Do not use any sheets or blankets that are too big for your bed. They should not hang down onto the floor.  Have a firm chair that has side arms. You can use this for support while you get dressed.  Do not have throw rugs and other things on the floor that can make you trip. What can I do in the kitchen?  Clean up any spills right away.  Avoid walking on wet floors.  Keep items that you use a lot in easy-to-reach places.  If you need to reach something above you, use a strong step stool that has a grab bar.  Keep electrical cords out of the way.  Do not use floor polish or wax that makes floors slippery. If you must use wax, use non-skid floor  wax.  Do not have throw rugs and other things on the floor that can make you trip. What can I do with my stairs?  Do not leave any items on the stairs.  Make sure that there are handrails on both sides of the stairs and use them. Fix handrails that are broken or loose. Make sure that handrails are as long as the stairways.  Check any carpeting to make sure that it is firmly attached to the stairs. Fix any carpet that is loose or worn.  Avoid having throw rugs at the top or bottom of the stairs. If you do have throw rugs, attach them to the floor with carpet tape.  Make sure that you have a light switch at the top of the stairs and the bottom of the stairs. If you do not have them, ask someone to add them for you. What else can I do to help prevent falls?  Wear shoes that:  Do not have high heels.  Have rubber bottoms.  Are comfortable and fit you well.  Are closed at the toe. Do not wear sandals.  If you use a stepladder:  Make sure that it is fully opened. Do not climb a closed stepladder.  Make sure that both sides of the stepladder are locked into place.  Ask someone to hold it for you, if possible.  Clearly mark and make sure that you can see:  Any grab bars or handrails.  First and last steps.  Where the edge of each step is.  Use tools that help you move around (mobility aids) if they are needed. These include:  Canes.  Walkers.  Scooters.  Crutches.  Turn on the lights when you go into a dark area. Replace any light bulbs as soon as they burn out.  Set up your furniture so you have a clear path. Avoid moving your furniture around.  If any of your floors are uneven, fix them.  If there are any pets around you, be aware of where they are.  Review your medicines with your doctor. Some medicines can make you feel dizzy. This can increase your chance of falling. Ask your doctor what other things that you can do to help prevent falls. This information is  not intended to replace advice given to you by your health care provider. Make sure you discuss any questions you have with your health care provider. Document Released: 07/15/2009 Document Revised: 02/24/2016 Document Reviewed: 10/23/2014 Elsevier Interactive Patient Education  2017 [employer]

## 2016-12-22 ENCOUNTER — Encounter: Payer: Self-pay | Admitting: Family Medicine

## 2016-12-22 LAB — MICROALBUMIN / CREATININE URINE RATIO
Creatinine, Urine: 340.1 mg/dL
MICROALB UR: 72.5 ug/mL — AB
Microalb Creat Ratio: 21.3 mg/g creat (ref 0.0–30.0)

## 2016-12-22 NOTE — Assessment & Plan Note (Signed)

## 2016-12-22 NOTE — Assessment & Plan Note (Signed)
Brittany Maxwell is reminded of the importance of commitment to daily physical activity for 30 minutes or more, as able and the need to limit carbohydrate intake to 30 to 60 grams per meal to help with blood sugar control.   The need to take medication as prescribed, test blood sugar as directed, and to call between visits if there is a concern that blood sugar is uncontrolled is also discussed.   Brittany Maxwell is reminded of the importance of daily foot exam, annual eye examination, and good blood sugar, blood pressure and cholesterol control.  Diabetic Labs Latest Ref Rng & Units 11/16/2016 06/19/2016 11/11/2015 11/09/2015 08/07/2015  HbA1c <5.7 % 5.9(H) 6.3(H) - 6.2(H) 6.6(H)  Microalbumin Not estab mg/dL - - 1.0 - -  Micro/Creat Ratio <30 mcg/mg creat - - 9 - -  Chol <200 mg/dL [id number] 096154 - 045146 -  HDL >40>50 mg/dL 98(J50(L) 47 - 46 -  Calc LDL <100 mg/dL 84 68 - 61 -  Triglycerides <150 mg/dL 191143 478(G194(H) - 956(O194(H) -  Creatinine 0.50 - 1.05 mg/dL 1.30(Q1.10(H) 6.570.89 - 8.461.01 9.62(X1.09(H)   BP/Weight 12/21/2016 09/06/2016 08/11/2016 07/12/2016 06/19/2016 11/11/2015 02/17/2015  Systolic BP 124 143 151 129 130 120 130  Diastolic BP 86 87 99 85 84 84 82  Wt. (Lbs) 209.8 - 212 213 211.4 209 202.4  BMI 37.16 - 37.55 37.73 37.45 37.03 35.86   Foot/eye exam completion dates 12/21/2016 11/11/2015  Foot Form Completion Done Done

## 2016-12-26 ENCOUNTER — Encounter (INDEPENDENT_AMBULATORY_CARE_PROVIDER_SITE_OTHER): Payer: Self-pay | Admitting: *Deleted

## 2016-12-28 ENCOUNTER — Ambulatory Visit (HOSPITAL_COMMUNITY)
Admission: RE | Admit: 2016-12-28 | Discharge: 2016-12-28 | Disposition: A | Discharge status: Home / Self Care | Payer: BLUE CROSS/BLUE SHIELD | Source: Ambulatory Visit | Attending: Family Medicine | Admitting: Family Medicine

## 2016-12-28 ENCOUNTER — Other Ambulatory Visit: Payer: Self-pay | Admitting: Family Medicine

## 2016-12-28 ENCOUNTER — Ambulatory Visit (HOSPITAL_COMMUNITY): Payer: BLUE CROSS/BLUE SHIELD

## 2016-12-28 DIAGNOSIS — Z1231 Encounter for screening mammogram for malignant neoplasm of breast: Secondary | ICD-10-CM

## 2017-01-02 ENCOUNTER — Other Ambulatory Visit: Payer: Self-pay | Admitting: Family Medicine

## 2017-01-02 MED ORDER — METFORMIN HCL 500 MG PO TABS: 500.0000 mg | ORAL_TABLET | Freq: Two times a day (BID) | ORAL | 1 refills | Status: DC

## 2017-01-03 ENCOUNTER — Other Ambulatory Visit: Payer: Self-pay

## 2017-01-03 MED ORDER — METFORMIN HCL 500 MG PO TABS: 500.0000 mg | ORAL_TABLET | Freq: Two times a day (BID) | ORAL | 1 refills | Status: DC

## 2017-01-17 ENCOUNTER — Encounter (INDEPENDENT_AMBULATORY_CARE_PROVIDER_SITE_OTHER): Payer: Self-pay | Admitting: *Deleted

## 2017-01-18 ENCOUNTER — Other Ambulatory Visit (INDEPENDENT_AMBULATORY_CARE_PROVIDER_SITE_OTHER): Payer: Self-pay | Admitting: *Deleted

## 2017-01-18 DIAGNOSIS — Z1211 Encounter for screening for malignant neoplasm of colon: Secondary | ICD-10-CM

## 2017-02-05 ENCOUNTER — Other Ambulatory Visit: Payer: Self-pay

## 2017-02-05 MED ORDER — PRAVASTATIN SODIUM 20 MG PO TABS: 20.0000 mg | ORAL_TABLET | Freq: Every day | ORAL | 1 refills | Status: DC

## 2017-02-21 LAB — HM DIABETES EYE EXAM

## 2017-04-03 ENCOUNTER — Other Ambulatory Visit (INDEPENDENT_AMBULATORY_CARE_PROVIDER_SITE_OTHER): Payer: Self-pay | Admitting: Internal Medicine

## 2017-04-03 ENCOUNTER — Telehealth (INDEPENDENT_AMBULATORY_CARE_PROVIDER_SITE_OTHER): Payer: Self-pay | Admitting: *Deleted

## 2017-04-03 ENCOUNTER — Encounter (INDEPENDENT_AMBULATORY_CARE_PROVIDER_SITE_OTHER): Payer: Self-pay | Admitting: *Deleted

## 2017-04-03 DIAGNOSIS — Z1211 Encounter for screening for malignant neoplasm of colon: Secondary | ICD-10-CM

## 2017-04-03 MED ORDER — PEG 3350-KCL-NA BICARB-NACL 420 G PO SOLR: 4000.0000 mL | Freq: Once | ORAL | 0 refills | Status: AC

## 2017-04-03 MED ORDER — PEG 3350-KCL-NA BICARB-NACL 420 G PO SOLR: 4000.0000 mL | Freq: Once | ORAL | 0 refills | Status: DC

## 2017-04-03 NOTE — Telephone Encounter (Signed)
Patient needs trilyte -- screening 

## 2017-04-05 MED ORDER — PEG 3350-KCL-NA BICARB-NACL 420 G PO SOLR: 4000.0000 mL | Freq: Once | ORAL | 0 refills | Status: AC

## 2017-04-27 ENCOUNTER — Telehealth (INDEPENDENT_AMBULATORY_CARE_PROVIDER_SITE_OTHER): Payer: Self-pay | Admitting: *Deleted

## 2017-04-27 NOTE — Telephone Encounter (Signed)
Referring MD/PCP: simpson   Procedure: tcs  Reason/Indication:  screening  Has patient had this procedure before?  no  If so, when, by whom and where?    Is there a family history of colon cancer?  no  Who?  What age when diagnosed?    Is patient diabetic?   yes      Does patient have prosthetic heart valve or mechanical valve?  no  Do you have a pacemaker?  no  Has patient ever had endocarditis? no  Has patient had joint replacement within last 12 months?  no  Does patient tend to be constipated or take laxatives? no  Does patient have a history of alcohol/drug use?  no  Is patient on Coumadin, Plavix and/or Aspirin? no  Medications: see epic  Allergies: see epic  Medication Adjustment per Dr Karilyn Cotaehman: hold metformin evening before and morning of  Procedure date & time: 05/23/17 at 930

## 2017-05-01 NOTE — Telephone Encounter (Signed)
agree

## 2017-05-23 ENCOUNTER — Encounter (HOSPITAL_COMMUNITY)
Admission: RE | Disposition: A | Discharge status: Home / Self Care | Payer: Self-pay | Source: Ambulatory Visit | Attending: Internal Medicine

## 2017-05-23 ENCOUNTER — Ambulatory Visit (HOSPITAL_COMMUNITY)
Admission: RE | Admit: 2017-05-23 | Discharge: 2017-05-23 | Disposition: A | Discharge status: Home / Self Care | Payer: BLUE CROSS/BLUE SHIELD | Source: Ambulatory Visit | Attending: Internal Medicine | Admitting: Internal Medicine

## 2017-05-23 ENCOUNTER — Encounter (HOSPITAL_COMMUNITY): Payer: Self-pay | Admitting: *Deleted

## 2017-05-23 DIAGNOSIS — E119 Type 2 diabetes mellitus without complications: Secondary | ICD-10-CM | POA: Diagnosis not present

## 2017-05-23 DIAGNOSIS — Z79899 Other long term (current) drug therapy: Secondary | ICD-10-CM | POA: Diagnosis not present

## 2017-05-23 DIAGNOSIS — Z1211 Encounter for screening for malignant neoplasm of colon: Secondary | ICD-10-CM | POA: Insufficient documentation

## 2017-05-23 DIAGNOSIS — E785 Hyperlipidemia, unspecified: Secondary | ICD-10-CM | POA: Diagnosis not present

## 2017-05-23 DIAGNOSIS — I10 Essential (primary) hypertension: Secondary | ICD-10-CM | POA: Insufficient documentation

## 2017-05-23 DIAGNOSIS — Z7984 Long term (current) use of oral hypoglycemic drugs: Secondary | ICD-10-CM | POA: Diagnosis not present

## 2017-05-23 DIAGNOSIS — K648 Other hemorrhoids: Secondary | ICD-10-CM | POA: Insufficient documentation

## 2017-05-23 DIAGNOSIS — K644 Residual hemorrhoidal skin tags: Secondary | ICD-10-CM | POA: Insufficient documentation

## 2017-05-23 HISTORY — PX: COLONOSCOPY: SHX5424

## 2017-05-23 LAB — GLUCOSE, CAPILLARY: Glucose-Capillary: 84 mg/dL (ref 65–99)

## 2017-05-23 SURGERY — COLONOSCOPY
Anesthesia: Moderate Sedation

## 2017-05-23 MED ORDER — MIDAZOLAM HCL 5 MG/5ML IJ SOLN
INTRAMUSCULAR | Status: AC
  Filled 2017-05-23: qty 10

## 2017-05-23 MED ORDER — MIDAZOLAM HCL 5 MG/5ML IJ SOLN
INTRAMUSCULAR | Status: DC | PRN
  Administered 2017-05-23 (×3): 2 mg via INTRAVENOUS

## 2017-05-23 MED ORDER — MEPERIDINE HCL 50 MG/ML IJ SOLN
INTRAMUSCULAR | Status: AC
  Filled 2017-05-23: qty 1

## 2017-05-23 MED ORDER — SODIUM CHLORIDE 0.9 % IV SOLN
INTRAVENOUS | Status: DC
  Administered 2017-05-23: 1000 mL via INTRAVENOUS

## 2017-05-23 MED ORDER — MEPERIDINE HCL 50 MG/ML IJ SOLN
INTRAMUSCULAR | Status: DC | PRN
  Administered 2017-05-23 (×2): 25 mg via INTRAVENOUS

## 2017-05-23 NOTE — Discharge Instructions (Signed)
Resume usual medications and diet. °No driving for 24 hours. °Next screening exam in 10 years. ° ° ° °Colonoscopy, Adult, Care After °This sheet gives you information about how to care for yourself after your procedure. Your doctor may also give you more specific instructions. If you have problems or questions, call your doctor. °Follow these instructions at home: °General instructions ° °· For the first 24 hours after the procedure: °? Do not drive or use machinery. °? Do not sign important documents. °? Do not drink alcohol. °? Do your daily activities more slowly than normal. °? Eat foods that are soft and easy to digest. °? Rest often. °· Take over-the-counter or prescription medicines only as told by your doctor. °· It is up to you to get the results of your procedure. Ask your doctor, or the department performing the procedure, when your results will be ready. °To help cramping and bloating: °· Try walking around. °· Put heat on your belly (abdomen) as told by your doctor. Use a heat source that your doctor recommends, such as a moist heat pack or a heating pad. °? Put a towel between your skin and the heat source. °? Leave the heat on for 20-30 minutes. °? Remove the heat if your skin turns bright red. This is especially important if you cannot feel pain, heat, or cold. You can get burned. °Eating and drinking °· Drink enough fluid to keep your pee (urine) clear or pale yellow. °· Return to your normal diet as told by your doctor. Avoid heavy or fried foods that are hard to digest. °· Avoid drinking alcohol for as long as told by your doctor. °Contact a doctor if: °· You have blood in your poop (stool) 2-3 days after the procedure. °Get help right away if: °· You have more than a small amount of blood in your poop. °· You see large clumps of tissue (blood clots) in your poop. °· Your belly is swollen. °· You feel sick to your stomach (nauseous). °· You throw up (vomit). °· You have a fever. °· You have belly  pain that gets worse, and medicine does not help your pain. °This information is not intended to replace advice given to you by your health care provider. Make sure you discuss any questions you have with your health care provider. °Document Released: 10/21/2010 Document Revised: 06/12/2016 Document Reviewed: 06/12/2016 °Elsevier Interactive Patient Education © 2017 [employer] Inc. ° ° ° ° °Hemorrhoids °Hemorrhoids are swollen veins in and around the rectum or anus. There are two types of hemorrhoids: °· Internal hemorrhoids. These occur in the veins that are just inside the rectum. They may poke through to the outside and become irritated and painful. °· External hemorrhoids. These occur in the veins that are outside of the anus and can be felt as a painful swelling or hard lump near the anus. ° °Most hemorrhoids do not cause serious problems, and they can be managed with home treatments such as diet and lifestyle changes. If home treatments do not help your symptoms, procedures can be done to shrink or remove the hemorrhoids. °What are the causes? °This condition is caused by increased pressure in the anal area. This pressure may result from various things, including: °· Constipation. °· Straining to have a bowel movement. °· Diarrhea. °· Pregnancy. °· Obesity. °· Sitting for long periods of time. °· Heavy lifting or other activity that causes you to strain. °· Anal sex. ° °What are the signs or symptoms? °Symptoms   of this condition include: °· Pain. °· Anal itching or irritation. °· Rectal bleeding. °· Leakage of stool (feces). °· Anal swelling. °· One or more lumps around the anus. ° °How is this diagnosed? °This condition can often be diagnosed through a visual exam. Other exams or tests may also be done, such as: °· Examination of the rectal area with a gloved hand (digital rectal exam). °· Examination of the anal canal using a small tube (anoscope). °· A blood test, if you have lost a significant amount of  blood. °· A test to look inside the colon (sigmoidoscopy or colonoscopy). ° °How is this treated? °This condition can usually be treated at home. However, various procedures may be done if dietary changes, lifestyle changes, and other home treatments do not help your symptoms. These procedures can help make the hemorrhoids smaller or remove them completely. Some of these procedures involve surgery, and others do not. Common procedures include: °· Rubber band ligation. Rubber bands are placed at the base of the hemorrhoids to cut off the blood supply to them. °· Sclerotherapy. Medicine is injected into the hemorrhoids to shrink them. °· Infrared coagulation. A type of light energy is used to get rid of the hemorrhoids. °· Hemorrhoidectomy surgery. The hemorrhoids are surgically removed, and the veins that supply them are tied off. °· Stapled hemorrhoidopexy surgery. A circular stapling device is used to remove the hemorrhoids and use staples to cut off the blood supply to them. ° °Follow these instructions at home: °Eating and drinking °· Eat foods that have a lot of fiber in them, such as whole grains, beans, nuts, fruits, and vegetables. Ask your health care provider about taking products that have added fiber (fiber supplements). °· Drink enough fluid to keep your urine clear or pale yellow. °Managing pain and swelling °· Take warm sitz baths for 20 minutes, 3-4 times a day to ease pain and discomfort. °· If directed, apply ice to the affected area. Using ice packs between sitz baths may be helpful. °? Put ice in a plastic bag. °? Place a towel between your skin and the bag. °? Leave the ice on for 20 minutes, 2-3 times a day. °General instructions °· Take over-the-counter and prescription medicines only as told by your health care provider. °· Use medicated creams or suppositories as told. °· Exercise regularly. °· Go to the bathroom when you have the urge to have a bowel movement. Do not wait. °· Avoid straining  to have bowel movements. °· Keep the anal area dry and clean. Use wet toilet paper or moist towelettes after a bowel movement. °· Do not sit on the toilet for long periods of time. This increases blood pooling and pain. °Contact a health care provider if: °· You have increasing pain and swelling that are not controlled by treatment or medicine. °· You have uncontrolled bleeding. °· You have difficulty having a bowel movement, or you are unable to have a bowel movement. °· You have pain or inflammation outside the area of the hemorrhoids. °This information is not intended to replace advice given to you by your health care provider. Make sure you discuss any questions you have with your health care provider. °Document Released: 09/15/2000 Document Revised: 02/16/2016 Document Reviewed: 06/02/2015 °Elsevier Interactive Patient Education © 2017 Elsevier Inc. ° °

## 2017-05-23 NOTE — Op Note (Addendum)
[facility] Patient Name: Brittany Maxwell Procedure Date: 05/23/2017 8:59 AM MRN: [id number] Date of Birth: [birth year] Attending MD: Lionel December , MD CSN: [id number] Age: 51 Admit Type: Outpatient Procedure:                Colonoscopy Indications:              Screening for colorectal malignant neoplasm Providers:                Lionel December, MD, Nena Polio, RN, Edrick Kins, RN Referring MD:             Milus Mallick. Lodema Hong, MD Medicines:                Meperidine 50 mg IV, Midazolam 6 mg IV Complications:            No immediate complications. Estimated Blood Loss:     Estimated blood loss: none. Procedure:                Pre-Anesthesia Assessment:                           - Prior to the procedure, a History and Physical                            was performed, and patient medications and                            allergies were reviewed. The patient's tolerance of                            previous anesthesia was also reviewed. The risks                            and benefits of the procedure and the sedation                            options and risks were discussed with the patient.                            All questions were answered, and informed consent                            was obtained. Prior Anticoagulants: The patient has                            taken no previous anticoagulant or antiplatelet                            agents. ASA Grade Assessment: II - A patient with                            mild systemic disease. After reviewing the risks                            and benefits, the patient was deemed in  satisfactory condition to undergo the procedure.                           After obtaining informed consent, the colonoscope                            was passed under direct vision. Throughout the                            procedure, the patient's blood pressure, pulse, and                            oxygen  saturations were monitored continuously. The                            [id number] ([id number]) scope was introduced through                            the anus and advanced to the the cecum, identified                            by appendiceal orifice and ileocecal valve. The                            colonoscopy was performed without difficulty. The                            patient tolerated the procedure well. The quality                            of the bowel preparation was good. The appendiceal                            orifice and the rectum were photographed. Scope In: 9:29:14 AM Scope Out: 9:47:07 AM Scope Withdrawal Time: 0 hours 12 minutes 20 seconds  Total Procedure Duration: 0 hours 17 minutes 53 seconds  Findings:      The perianal exam findings include skin tags.      The digital rectal exam was normal.      The colon (entire examined portion) appeared normal.      Internal hemorrhoids were found during retroflexion. The hemorrhoids       were small. Impression:               - Perianal skin tags found on perianal exam.                           - The entire examined colon is normal.                           - Internal hemorrhoids.                           - No specimens collected. Moderate Sedation:      Moderate (conscious) sedation was administered by the endoscopy nurse  and supervised by the endoscopist. The following parameters were       monitored: oxygen saturation, heart rate, blood pressure, CO2       capnography and response to care. Total physician intraservice time was       26 minutes. Recommendation:           - Patient has a contact number available for                            emergencies. The signs and symptoms of potential                            delayed complications were discussed with the                            patient. Return to normal activities tomorrow.                            Written discharge instructions were provided to  the                            patient.                           - Resume previous diet today.                           - Continue present medications.                           - Repeat colonoscopy in 10 years for screening                            purposes. Procedure Code(s):        --- Professional ---                           [phone], Colonoscopy, flexible; diagnostic, including                            collection of specimen(s) by brushing or washing,                            when performed (separate procedure)                           99152, Moderate sedation services provided by the                            same physician or other qualified health care                            professional performing the diagnostic or                            therapeutic service that the sedation supports,  requiring the presence of an independent trained                            observer to assist in the monitoring of the                            patient's level of consciousness and physiological                            status; initial 15 minutes of intraservice time,                            patient age 92 years or older                           [phone], Moderate sedation services; each additional                            15 minutes intraservice time Diagnosis Code(s):        --- Professional ---                           Z12.11, Encounter for screening for malignant                            neoplasm of colon                           K64.8, Other hemorrhoids                           K64.4, Residual hemorrhoidal skin tags CPT copyright 2016 [employer]. All rights reserved. The codes documented in this report are preliminary and upon coder review may  be revised to meet current compliance requirements. Lionel December, MD Lionel December, MD 05/23/2017 9:54:52 AM This report has been signed electronically. Number of Addenda: 0

## 2017-05-23 NOTE — H&P (Signed)
Brittany Maxwell is an 51 y.o. female.   Chief Complaint: Patient is here for colonoscopy. HPI: Patient is 51 year old African-American female who is in for screening colonoscopy. She denies abdominal pain or change in bowel habits. She has intermittent hematochezia felt to be secondary hemorrhoids. Family history is negative for CRC.  Past Medical History:  Diagnosis Date  . Arthritis   . Diabetes mellitus without complication (HCC)   . Hyperlipidemia   . Hypertension     Past Surgical History:  Procedure Laterality Date  . CHOLECYSTECTOMY    . TUBAL LIGATION      Family History  Problem Relation Age of Onset  . Hypertension Mother   . Hyperlipidemia Mother   . Stroke Father   . Hypertension Father   . Hyperlipidemia Father   . Hypertension Sister    Social History:  reports that she has never smoked. She has never used smokeless tobacco. She reports that she does not drink alcohol or use drugs.  Allergies:  Allergies  Allergen Reactions  . Ace Inhibitors Cough  . Meloxicam Nausea Only    Medications Prior to Admission  Medication Sig Dispense Refill  . acetaminophen (TYLENOL) 500 MG tablet Take 500 mg by mouth every 8 (eight) hours as needed for mild pain or moderate pain.     Marland Kitchen amLODipine (NORVASC) 10 MG tablet TAKE ONE TABLET BY MOUTH ONCE DAILY 90 tablet 1  . cholecalciferol (VITAMIN D) 1000 units tablet Take 1,000 Units by mouth daily.    . DULoxetine (CYMBALTA) 60 MG capsule Take 60 mg by mouth every other day.    . metFORMIN (GLUCOPHAGE) 500 MG tablet Take 1 tablet (500 mg total) by mouth 2 (two) times daily with a meal. 180 tablet 1  . Multiple Vitamin (MULTIVITAMIN) capsule Take 1 capsule by mouth daily.      . potassium chloride SA (K-DUR,KLOR-CON) 20 MEQ tablet Take 1 tablet (20 mEq total) by mouth 2 (two) times daily. 60 tablet 3  . pravastatin (PRAVACHOL) 20 MG tablet Take 1 tablet (20 mg total) by mouth daily. 90 tablet 1  .  triamterene-hydrochlorothiazide (MAXZIDE) 75-50 MG tablet TAKE ONE TABLET BY MOUTH ONCE DAILY 90 tablet 1    Results for orders placed or performed during the hospital encounter of 05/23/17 (from the past 48 hour(s))  Glucose, capillary     Status: None   Collection Time: 05/23/17  8:56 AM  Result Value Ref Range   Glucose-Capillary 84 65 - 99 mg/dL   No results found.  ROS  Blood pressure (!) 133/94, pulse 72, temperature 98.4 F (36.9 C), temperature source Oral, resp. rate 17, height 5\' 4"  (1.626 m), weight 212 lb (96.2 kg), SpO2 98 %. Physical Exam  Constitutional: She appears well-developed and well-nourished.  HENT:  Mouth/Throat: Oropharynx is clear and moist.  Eyes: Conjunctivae are normal. No scleral icterus.  Neck: No thyromegaly present.  Cardiovascular: Normal rate, regular rhythm and normal heart sounds.   No murmur heard. Respiratory: Effort normal and breath sounds normal.  GI:  Abdomen is full. It is soft and nontender without organomegaly or masses.  Musculoskeletal: She exhibits no edema.  Neurological: She is alert.  Skin: Skin is warm and dry.     Assessment/Plan Average risk screening colonoscopy.  Lionel December, MD 05/23/2017, 9:17 AM

## 2017-05-25 ENCOUNTER — Encounter (HOSPITAL_COMMUNITY): Payer: Self-pay | Admitting: Internal Medicine

## 2017-06-14 ENCOUNTER — Encounter (HOSPITAL_COMMUNITY): Payer: Self-pay | Admitting: Emergency Medicine

## 2017-06-14 ENCOUNTER — Emergency Department (HOSPITAL_COMMUNITY)
Admission: EM | Admit: 2017-06-14 | Discharge: 2017-06-14 | Disposition: A | Discharge status: Home / Self Care | Payer: BLUE CROSS/BLUE SHIELD | Attending: Emergency Medicine | Admitting: Emergency Medicine

## 2017-06-14 ENCOUNTER — Emergency Department (HOSPITAL_COMMUNITY): Payer: BLUE CROSS/BLUE SHIELD

## 2017-06-14 DIAGNOSIS — Z7984 Long term (current) use of oral hypoglycemic drugs: Secondary | ICD-10-CM | POA: Insufficient documentation

## 2017-06-14 DIAGNOSIS — Z79899 Other long term (current) drug therapy: Secondary | ICD-10-CM | POA: Insufficient documentation

## 2017-06-14 DIAGNOSIS — E662 Morbid (severe) obesity with alveolar hypoventilation: Secondary | ICD-10-CM | POA: Diagnosis not present

## 2017-06-14 DIAGNOSIS — M79672 Pain in left foot: Secondary | ICD-10-CM | POA: Diagnosis not present

## 2017-06-14 DIAGNOSIS — E119 Type 2 diabetes mellitus without complications: Secondary | ICD-10-CM | POA: Insufficient documentation

## 2017-06-14 DIAGNOSIS — I10 Essential (primary) hypertension: Secondary | ICD-10-CM | POA: Insufficient documentation

## 2017-06-14 DIAGNOSIS — S99922A Unspecified injury of left foot, initial encounter: Secondary | ICD-10-CM | POA: Diagnosis not present

## 2017-06-14 MED ORDER — IBUPROFEN 400 MG PO TABS
400.0000 mg | ORAL_TABLET | Freq: Once | ORAL | Status: AC
  Administered 2017-06-14: 400 mg via ORAL
  Filled 2017-06-14: qty 1

## 2017-06-14 MED ORDER — ACETAMINOPHEN 325 MG PO TABS
650.0000 mg | ORAL_TABLET | Freq: Once | ORAL | Status: AC
  Administered 2017-06-14: 650 mg via ORAL
  Filled 2017-06-14: qty 2

## 2017-06-14 MED ORDER — NAPROXEN 250 MG PO TABS: 250.0000 mg | ORAL_TABLET | Freq: Two times a day (BID) | ORAL | 0 refills | Status: DC | PRN

## 2017-06-14 NOTE — ED Triage Notes (Signed)
Pt reports hitting left little toe on table leg last week with resolution of pain after injury.  States now the left side of her foot is throbbing and unable to bear weight.

## 2017-06-14 NOTE — ED Provider Notes (Signed)
[facility] Provider Note   CSN: [id number] Arrival date & time: 06/14/17  0705     History   Chief Complaint Chief Complaint  Patient presents with  . Foot Pain    HPI Brittany Maxwell is a 51 y.o. female.   Foot Pain   Pt was seen at 0730. Per pt, c/o gradual onset and persistence of constant left foot "pain" since last week. Pt states she hit her left lateral foot and 5th toe against a table leg last week. Pt states her left foot continues to "throb," which worsens with palpation of the area and when she weight bears on it. Denies new injury, denies any other injuries. Denies focal motor weakness, no tingling/numbness in extremities.    Past Medical History:  Diagnosis Date  . Arthritis   . Diabetes mellitus without complication (HCC)   . Hyperlipidemia   . Hypertension     Patient Active Problem List   Diagnosis Date Noted  . Special screening for malignant neoplasms, colon 01/18/2017  . Graves disease 08/11/2016  . Essential hypertension, benign 07/12/2016  . Medicare annual wellness visit, subsequent 11/11/2015  . Metabolic syndrome X 11/08/2013  . Vitamin D insufficiency 07/01/2013  . Diabetes mellitus type 2 in obese (HCC) 11/24/2012  . Hypercholesteremia 11/24/2012  . Allergic rhinitis 08/28/2011  . LOW BACK PAIN 04/19/2009  . UNSPECIFIED ACQUIRED DEFORMITY OF HIP 03/29/2009  . Morbid obesity (HCC) 02/04/2008    Past Surgical History:  Procedure Laterality Date  . CHOLECYSTECTOMY    . COLONOSCOPY N/A 05/23/2017   Procedure: COLONOSCOPY;  Surgeon: Malissa Hippoehman, Najeeb U, MD;  Location: [facility];  Service: Endoscopy;  Laterality: N/A;  930  . TUBAL LIGATION      OB History    No data available       Home Medications    Prior to Admission medications   Medication Sig Start Date End Date Taking? Authorizing Provider  acetaminophen (TYLENOL) 500 MG tablet Take 500 mg by mouth every 8 (eight) hours as needed for mild pain or moderate pain.      [provider]  amLODipine (NORVASC) 10 MG tablet TAKE ONE TABLET BY MOUTH ONCE DAILY 12/13/16   Kerri PerchesSimpson, Margaret E, MD  cholecalciferol (VITAMIN D) 1000 units tablet Take 1,000 Units by mouth daily.    [provider]  DULoxetine (CYMBALTA) 60 MG capsule Take 60 mg by mouth every other day.    [provider]  metFORMIN (GLUCOPHAGE) 500 MG tablet Take 1 tablet (500 mg total) by mouth 2 (two) times daily with a meal. 01/03/17   Kerri PerchesSimpson, Margaret E, MD  Multiple Vitamin (MULTIVITAMIN) capsule Take 1 capsule by mouth daily.      [provider]  potassium chloride SA (K-DUR,KLOR-CON) 20 MEQ tablet Take 1 tablet (20 mEq total) by mouth 2 (two) times daily. 11/20/16   Kerri PerchesSimpson, Margaret E, MD  pravastatin (PRAVACHOL) 20 MG tablet Take 1 tablet (20 mg total) by mouth daily. 02/05/17   Kerri PerchesSimpson, Margaret E, MD  triamterene-hydrochlorothiazide (MAXZIDE) 75-50 MG tablet TAKE ONE TABLET BY MOUTH ONCE DAILY 11/02/16   Kerri PerchesSimpson, Margaret E, MD    Family History Family History  Problem Relation Age of Onset  . Hypertension Mother   . Hyperlipidemia Mother   . Stroke Father   . Hypertension Father   . Hyperlipidemia Father   . Hypertension Sister     Social History Social History  Substance Use Topics  . Smoking status: Never Smoker  .  Smokeless tobacco: Never Used  . Alcohol use No     Allergies   Ace inhibitors and Meloxicam   Review of Systems Review of Systems ROS: Statement: All systems negative except as marked or noted in the HPI; Constitutional: Negative for fever and chills. ; ; Eyes: Negative for eye pain, redness and discharge. ; ; ENMT: Negative for ear pain, hoarseness, nasal congestion, sinus pressure and sore throat. ; ; Cardiovascular: Negative for chest pain, palpitations, diaphoresis, dyspnea and peripheral edema. ; ; Respiratory: Negative for cough, wheezing and stridor. ; ; Gastrointestinal: Negative for nausea, vomiting, diarrhea, abdominal  pain, blood in stool, hematemesis, jaundice and rectal bleeding. . ; ; Genitourinary: Negative for dysuria, flank pain and hematuria. ; ; Musculoskeletal: +left foot pain. Negative for back pain and neck pain. Negative for deformity..; ; Skin: Negative for pruritus, rash, abrasions, blisters, bruising and skin lesion.; ; Neuro: Negative for headache, lightheadedness and neck stiffness. Negative for weakness, altered level of consciousness, altered mental status, extremity weakness, paresthesias, involuntary movement, seizure and syncope.       Physical Exam Updated Vital Signs BP 134/88 (BP Location: Left Arm)   Pulse 71   Temp 97.8 F (36.6 C) (Oral)   Resp 18   Ht  (1.6 m)   Wt 92.5 kg (204 lb)   SpO2 100%   BMI 36.14 kg/m   Physical Exam 0735: Physical examination:  Nursing notes reviewed; Vital signs and O2 SAT reviewed;  Constitutional: Well developed, Well nourished, Well hydrated, In no acute distress; Head:  Normocephalic, atraumatic; Eyes: EOMI, PERRL; ENMT: Mouth and pharynx normal, Mucous membranes moist; Neck: Supple, Full range of motion; Cardiovascular: Regular rate and rhythm; Respiratory: Breath sounds clear & equal bilaterally. Speaking full sentences with ease, Normal respiratory effort/excursion; Chest: Movement normal; Abdomen: Nondistended; Extremities: Pulses normal, No tenderness left knee/ankle. +left lateral foot and 5th toe TTP without open wounds, ecchymosis, erythema, edema, or deformity.   No calf tenderness, edema or asymmetry.; Neuro: AA&Ox3, Major CN grossly intact.  Speech clear. No gross focal motor or sensory deficits in extremities.; Skin: Color normal, Warm, Dry.   ED Treatments / Results  Labs (all labs ordered are listed, but only abnormal results are displayed)   EKG  EKG Interpretation None       Radiology   Procedures Procedures (including critical care time)  Medications Ordered in ED Medications  acetaminophen (TYLENOL)  tablet 650 mg (not administered)  ibuprofen (ADVIL,MOTRIN) tablet 400 mg (not administered)     Initial Impression / Assessment and Plan / ED Course  I have reviewed the triage vital signs and the nursing notes.  Pertinent labs & imaging results that were available during my care of the patient were reviewed by me and considered in my medical decision making (see chart for details).  MDM Reviewed: previous chart, nursing note and vitals Interpretation: x-ray    Dg Foot Complete Left Result Date: 06/14/2017 CLINICAL DATA:  Injury.  Difficulty bearing weight. EXAM: LEFT FOOT - COMPLETE 3+ VIEW COMPARISON:  No recent prior . FINDINGS: There is no evidence of fracture or dislocation. There is no evidence of arthropathy or other focal bone abnormality. Soft tissues are unremarkable. IMPRESSION: No acute abnormality identified. No evidence of fracture or dislocation. Electronically Signed   By: Maisie Fus  Register   On: 06/14/2017 07:54    0815:  XR reassuring. Tx symptomatically at this time. Dx and testing d/w pt.  Questions answered.  Verb understanding, agreeable to d/c home with  outpt f/u.   Final Clinical Impressions(s) / ED Diagnoses   Final diagnoses:  None    New Prescriptions New Prescriptions   No medications on file      Samuel Jester, DO 06/18/17 1452

## 2017-06-14 NOTE — Discharge Instructions (Signed)
Take the prescription as directed. Also take over the counter tylenol, as directed on packaging, as needed for discomfort. Apply moist heat or ice to the area(s) of discomfort, for 15 minutes at a time, several times per day for the next few days.  Do not fall asleep on a heating or ice pack.  Wear the post-op hard soled shoe for comfort.  Call your regular medical doctor today to schedule a follow up appointment within the next week.  Return to the Emergency Department immediately if worsening.

## 2017-07-05 ENCOUNTER — Ambulatory Visit (INDEPENDENT_AMBULATORY_CARE_PROVIDER_SITE_OTHER): Payer: BLUE CROSS/BLUE SHIELD | Admitting: Family Medicine

## 2017-07-05 ENCOUNTER — Encounter: Payer: Self-pay | Admitting: Family Medicine

## 2017-07-05 VITALS — BP 130/88 | HR 84 | Temp 98.6°F | Resp 16 | Ht 63.0 in | Wt 218.8 lb

## 2017-07-05 DIAGNOSIS — E1169 Type 2 diabetes mellitus with other specified complication: Secondary | ICD-10-CM | POA: Diagnosis not present

## 2017-07-05 DIAGNOSIS — Z0000 Encounter for general adult medical examination without abnormal findings: Secondary | ICD-10-CM

## 2017-07-05 DIAGNOSIS — Z23 Encounter for immunization: Secondary | ICD-10-CM | POA: Diagnosis not present

## 2017-07-05 DIAGNOSIS — E669 Obesity, unspecified: Secondary | ICD-10-CM | POA: Diagnosis not present

## 2017-07-05 DIAGNOSIS — I10 Essential (primary) hypertension: Secondary | ICD-10-CM | POA: Diagnosis not present

## 2017-07-05 DIAGNOSIS — E7800 Pure hypercholesterolemia, unspecified: Secondary | ICD-10-CM | POA: Diagnosis not present

## 2017-07-05 MED ORDER — PHENTERMINE HCL 37.5 MG PO TABS: 37.5000 mg | ORAL_TABLET | Freq: Every day | ORAL | 1 refills | Status: DC

## 2017-07-05 NOTE — Patient Instructions (Addendum)
F/u in 4 months, call if you need me sooner  New is phentermine half daily  It is important that you exercise regularly at least 30 minutes 5 times a week. If you develop chest pain, have severe difficulty breathing, or feel very tired, stop exercising immediately and seek medical attention  Weight loss goal of 2.5 to 3 pounds per month  Flu vaccine today   We will send for eye exam which you had   Fasting lipid, cmp and eGFR, HBA1c, CBC today  Please work on good  health habits so that your health will improve. 1. Commitment to daily physical activity for 30 to 60  minutes, if you are able to do this.  2. Commitment to wise food choices. Aim for half of your  food intake to be vegetable and fruit, one quarter starchy foods, and one quarter protein. Try to eat on a regular schedule  3 meals per day, snacking between meals should be limited to vegetables or fruits or small portions of nuts. 64 ounces of water per day is generally recommended, unless you have specific health conditions, like heart failure or kidney failure where you will need to limit fluid intake.  3. Commitment to sufficient and a  good quality of physical and mental rest daily, generally between 6 to 8 hours per day.  WITH PERSISTANCE AND PERSEVERANCE, THE IMPOSSIBLE , BECOMES THE NORM!   Thank you  for choosing [facility]. We consider it a privelige to serve you.  Delivering excellent health care in a caring and  compassionate way is our goal.  Partnering with you,  so that together we can achieve this goal is our strategy.

## 2017-07-06 LAB — HEMOGLOBIN A1C
HEMOGLOBIN A1C: 6.2 %{Hb} — AB (ref <5.7)
MEAN PLASMA GLUCOSE: 131 (calc)
eAG (mmol/L): 7.3 (calc)

## 2017-07-06 LAB — CBC
HEMATOCRIT: 35.5 % (ref 35.0–45.0)
HEMOGLOBIN: 12 g/dL (ref 11.7–15.5)
MCH: 28.4 pg (ref 27.0–33.0)
MCHC: 33.8 g/dL (ref 32.0–36.0)
MCV: 83.9 fL (ref 80.0–100.0)
MPV: 9.8 fL (ref 7.5–12.5)
Platelets: 407 10*3/uL — ABNORMAL HIGH (ref 140–400)
RBC: 4.23 10*6/uL (ref 3.80–5.10)
RDW: 14.2 % (ref 11.0–15.0)
WBC: 10.9 10*3/uL — AB (ref 3.8–10.8)

## 2017-07-06 LAB — COMPLETE METABOLIC PANEL WITH GFR
AG RATIO: 1.2 (calc) (ref 1.0–2.5)
ALKALINE PHOSPHATASE (APISO): 76 U/L (ref 33–130)
ALT: 15 U/L (ref 6–29)
AST: 15 U/L (ref 10–35)
Albumin: 4.1 g/dL (ref 3.6–5.1)
BILIRUBIN TOTAL: 0.6 mg/dL (ref 0.2–1.2)
BUN/Creatinine Ratio: 10 (calc) (ref 6–22)
BUN: 11 mg/dL (ref 7–25)
CHLORIDE: 100 mmol/L (ref 98–110)
CO2: 29 mmol/L (ref 20–32)
Calcium: 9.4 mg/dL (ref 8.6–10.4)
Creat: 1.09 mg/dL — ABNORMAL HIGH (ref 0.50–1.05)
GFR, Est African American: 68 mL/min/{1.73_m2} (ref >=60)
GFR, Est Non African American: 58 mL/min/{1.73_m2} — ABNORMAL LOW (ref >=60)
Globulin: 3.4 g/dL (calc) (ref 1.9–3.7)
Glucose, Bld: 84 mg/dL (ref 65–99)
POTASSIUM: 3.5 mmol/L (ref 3.5–5.3)
Sodium: 138 mmol/L (ref 135–146)
Total Protein: 7.5 g/dL (ref 6.1–8.1)

## 2017-07-06 LAB — LIPID PANEL
CHOL/HDL RATIO: 3.3 (calc) (ref <5.0)
Cholesterol: 185 mg/dL (ref <200)
HDL: 56 mg/dL (ref >50)
LDL CHOLESTEROL (CALC): 98 mg/dL
NON-HDL CHOLESTEROL (CALC): 129 mg/dL (ref <130)
TRIGLYCERIDES: 215 mg/dL — AB (ref <150)

## 2017-07-07 NOTE — Assessment & Plan Note (Signed)
Deteriorated. Patient re-educated about  the importance of commitment to a  minimum of 150 minutes of exercise per week.  The importance of healthy food choices with portion control discussed. Encouraged to start a food diary, count calories and to consider  joining a support group. Sample diet sheets offered. Goals set by the patient for the next several months.   Weight /BMI 07/05/2017 06/14/2017 05/23/2017  WEIGHT 218 lb 12 oz 204 lb 212 lb  HEIGHT     BMI 38.75 kg/m2 36.14 kg/m2 36.39 kg/m2  Start half phentermine daily , weight loss goal of 2.5  To 3 pounds per month

## 2017-07-07 NOTE — Progress Notes (Signed)
    Brittany Maxwell     MRN: [id number]      DOB: 1966-01-22  HPI: Patient is in for annual physical exam. Lost her grandmother in the Spring and has been battling poor eating habits and excess weight gain, wants to resume phentermine for a short period to help with weight loss Recent labs, if available are reviewed. Immunization is reviewed , and  updated if needed. Recently had her colonoscopy   PE: BP 130/88 (BP Location: Left Arm, Patient Position: Sitting, Cuff Size: Normal)   Pulse 84   Temp 98.6 F (37 C) (Other (Comment))   Resp 16   Ht  (1.6 m)   Wt 218 lb 12 oz (99.2 kg)   SpO2 97%   BMI 38.75 kg/m   Pleasant  female, alert and oriented x 3, in no cardio-pulmonary distress. Afebrile. HEENT No facial trauma or asymetry. Sinuses non tender.  Extra occullar muscles intact, pupils equally reactive to light. External ears normal, tympanic membranes clear. Oropharynx moist, no exudate. Neck: supple, no adenopathy,JVD or thyromegaly.No bruits.  Chest: Clear to ascultation bilaterally.No crackles or wheezes. Non tender to palpation  Breast: No asymetry,no masses or lumps. No tenderness. No nipple discharge or inversion. No axillary or supraclavicular adenopathy  Cardiovascular system; Heart sounds normal,  S1 and  S2 ,no S3.  No murmur, or thrill. Apical beat not displaced Peripheral pulses normal.  Abdomen: Soft, non tender, no organomegaly or masses. No bruits. Bowel sounds normal. No guarding, tenderness or rebound.  Rectal:  Not examined, not indicated due to recent colonoscopy  GU:  Not examined, asymptomatic    Musculoskeletal exam: Decreased  ROM of spine, hips , shoulders and knees. No deformity ,swelling or crepitus noted. No muscle wasting or atrophy.   Neurologic: Cranial nerves 2 to 12 intact. Power, tone ,sensation and reflexes normal throughout. No disturbance in gait. No tremor.  Skin: Intact, no ulceration, erythema , scaling  or rash noted. Pigmentation normal throughout  Psych; Normal mood and affect. Judgement and concentration normal   Assessment & Plan:  Annual physical exam Annual exam as documented. Counseling done  re healthy lifestyle involving commitment to 150 minutes exercise per week, heart healthy diet, and attaining healthy weight.The importance of adequate sleep also discussed.  Changes in health habits are decided on by the patient with goals and time frames  set for achieving them. Immunization and cancer screening needs are specifically addressed at this visit.   Morbid obesity (HCC) Deteriorated. Patient re-educated about  the importance of commitment to a  minimum of 150 minutes of exercise per week.  The importance of healthy food choices with portion control discussed. Encouraged to start a food diary, count calories and to consider  joining a support group. Sample diet sheets offered. Goals set by the patient for the next several months.   Weight /BMI 07/05/2017 06/14/2017 05/23/2017  WEIGHT 218 lb 12 oz 204 lb 212 lb  HEIGHT     BMI 38.75 kg/m2 36.14 kg/m2 36.39 kg/m2  Start half phentermine daily , weight loss goal of 2.5  To 3 pounds per month

## 2017-07-07 NOTE — Assessment & Plan Note (Signed)
Annual exam as documented. Counseling done  re healthy lifestyle involving commitment to 150 minutes exercise per week, heart healthy diet, and attaining healthy weight.The importance of adequate sleep also discussed. Changes in health habits are decided on by the patient with goals and time frames  set for achieving them. Immunization and cancer screening needs are specifically addressed at this visit. 

## 2017-07-07 NOTE — Assessment & Plan Note (Signed)
Controlled, no change in medication DASH diet and commitment to daily physical activity for a minimum of 30 minutes discussed and encouraged, as a part of hypertension management. The importance of attaining a healthy weight is also discussed.  BP/Weight 07/05/2017 06/14/2017 05/23/2017 12/21/2016 09/06/2016 08/11/2016 07/12/2016  Systolic BP 130 132 116 124 143 151 129  Diastolic BP 88 88 74 86 87 99 85  Wt. (Lbs) 218.75 204 212 209.8 - 212 213  BMI 38.75 36.14 36.39 37.16 - 37.55 37.73

## 2017-07-07 NOTE — Assessment & Plan Note (Signed)
Brittany Maxwell is reminded of the importance of commitment to daily physical activity for 30 minutes or more, as able and the need to limit carbohydrate intake to 30 to 60 grams per meal to help with blood sugar control.   The need to take medication as prescribed, test blood sugar as directed, and to call between visits if there is a concern that blood sugar is uncontrolled is also discussed.   Brittany Maxwell is reminded of the importance of daily foot exam, annual eye examination, and good blood sugar, blood pressure and cholesterol control. Controlled, no change in medication   Diabetic Labs Latest Ref Rng & Units 07/05/2017 12/21/2016 11/16/2016 06/19/2016 11/11/2015  HbA1c <5.7 % of total Hgb 6.2(H) - 5.9(H) 6.3(H) -  Microalbumin Not Estab. ug/mL - 72.5(H) - - 1.0  Micro/Creat Ratio 0.0 - 30.0 mg/g creat - 21.3 - - 9  Chol <200 mg/dL 098 - 119 147 -  HDL >82 mg/dL 56 - 95(A) 47 -  Calc LDL <100 mg/dL - - 84 68 -  Triglycerides <150 mg/dL 213(Y) - 865 784(O) -  Creatinine 0.50 - 1.05 mg/dL 9.62(X) - 5.28(U) 1.32 -   BP/Weight 07/05/2017 06/14/2017 05/23/2017 12/21/2016 09/06/2016 08/11/2016 07/12/2016  Systolic BP 130 132 116 124 143 151 129  Diastolic BP 88 88 74 86 87 99 85  Wt. (Lbs) 218.75 204 212 209.8 - 212 213  BMI 38.75 36.14 36.39 37.16 - 37.55 37.73   Foot/eye exam completion dates 12/21/2016 11/11/2015  Foot Form Completion Done Done

## 2017-07-09 ENCOUNTER — Encounter: Payer: Self-pay | Admitting: Family Medicine

## 2017-08-14 ENCOUNTER — Other Ambulatory Visit: Payer: Self-pay | Admitting: Family Medicine

## 2017-09-20 ENCOUNTER — Other Ambulatory Visit: Payer: Self-pay | Admitting: Family Medicine

## 2017-11-08 ENCOUNTER — Ambulatory Visit (INDEPENDENT_AMBULATORY_CARE_PROVIDER_SITE_OTHER): Payer: BLUE CROSS/BLUE SHIELD | Admitting: Family Medicine

## 2017-11-08 ENCOUNTER — Encounter: Payer: Self-pay | Admitting: Family Medicine

## 2017-11-08 VITALS — BP 108/80 | HR 59 | Temp 99.2°F | Resp 16 | Ht 63.0 in | Wt 198.0 lb

## 2017-11-08 DIAGNOSIS — E559 Vitamin D deficiency, unspecified: Secondary | ICD-10-CM

## 2017-11-08 DIAGNOSIS — E669 Obesity, unspecified: Secondary | ICD-10-CM

## 2017-11-08 DIAGNOSIS — E1169 Type 2 diabetes mellitus with other specified complication: Secondary | ICD-10-CM

## 2017-11-08 DIAGNOSIS — I10 Essential (primary) hypertension: Secondary | ICD-10-CM | POA: Diagnosis not present

## 2017-11-08 DIAGNOSIS — E7800 Pure hypercholesterolemia, unspecified: Secondary | ICD-10-CM

## 2017-11-08 DIAGNOSIS — J0100 Acute maxillary sinusitis, unspecified: Secondary | ICD-10-CM

## 2017-11-08 DIAGNOSIS — J209 Acute bronchitis, unspecified: Secondary | ICD-10-CM | POA: Diagnosis not present

## 2017-11-08 MED ORDER — BENZONATATE 100 MG PO CAPS
100.0000 mg | ORAL_CAPSULE | Freq: Two times a day (BID) | ORAL | 0 refills | Status: DC | PRN
Start: 2017-11-08 — End: 2018-01-08

## 2017-11-08 MED ORDER — AZITHROMYCIN 250 MG PO TABS: ORAL_TABLET | ORAL | 0 refills | Status: DC

## 2017-11-08 MED ORDER — PHENTERMINE HCL 37.5 MG PO TABS: 37.5000 mg | ORAL_TABLET | Freq: Every day | ORAL | 1 refills | Status: DC

## 2017-11-08 NOTE — Progress Notes (Signed)
Brittany Maxwell     MRN: [id number]      DOB: 29-Jun-1966   HPI Ms. Godby is here for follow up and re-evaluation of chronic medical conditions, medication management and review of any available recent lab and radiology data.  Preventive health is updated, specifically  Cancer screening and Immunization.   Questions or concerns regarding consultations or procedures which the PT has had in the interim are  addressed. The PT denies any adverse reactions to current medications since the last visit.  3 day h/o head and chest congestion, sore throat, chills and body aches, and mild sore throat, clear nasal drainage and clear sputum Other family members have been sick  ROS . Denies chest pains, palpitations and leg swelling Denies abdominal pain, nausea, vomiting,diarrhea or constipation.   Denies dysuria, frequency, hesitancy or incontinence. Denies joint pain, swelling and limitation in mobility. Denies headaches, seizures, numbness, or tingling. Denies depression, anxiety or insomnia. Denies skin break down or rash.   PE  BP 108/80   Pulse (!) 59   Temp 99.2 F (37.3 C) (Oral)   Resp 16   Ht 5\' 3"  (1.6 m)   Wt 198 lb (89.8 kg)   SpO2 97%   BMI 35.07 kg/m   Patient alert and oriented and in mild  cardiopulmonary distress.  HEENT: No facial asymmetry, EOMI,   oropharynx pink and moist.  Neck supple no JVD, left maxillary sinus tenderness, left anterior cervical adenitis  Chest: decreased air entry bilateral  crackles, no wheezes  CVS: S1, S2 no murmurs, no S3.Regular rate.  ABD: Soft non tender.   Ext: No edema  MS: Adequate ROM spine, shoulders, hips and knees.  Skin: Intact, no ulcerations or rash noted.  Psych: Good eye contact, normal affect. Memory intact not anxious or depressed appearing.  CNS: CN 2-12 intact, power,  normal throughout.no focal deficits noted.   Assessment & Plan  Acute maxillary sinusitis Antibiotic prescribed  Acute  bronchitis Antibiotic and decongestant prescribed  Essential hypertension, benign Controlled, no change in medication DASH diet and commitment to daily physical activity for a minimum of 30 minutes discussed and encouraged, as a part of hypertension management. The importance of attaining a healthy weight is also discussed.  BP/Weight 11/08/2017 07/05/2017 06/14/2017 05/23/2017 12/21/2016 09/06/2016 08/11/2016  Systolic BP 108 130 132 116 124 143 151  Diastolic BP 80 88 88 74 86 87 99  Wt. (Lbs) 198 218.75 204 212 209.8 - 212  BMI 35.07 38.75 36.14 36.39 37.16 - 37.55       Diabetes mellitus type 2 in obese Controlled, no change in medication Ms. Mcgough is reminded of the importance of commitment to daily physical activity for 30 minutes or more, as able and the need to limit carbohydrate intake to 30 to 60 grams per meal to help with blood sugar control.   The need to take medication as prescribed, test blood sugar as directed, and to call between visits if there is a concern that blood sugar is uncontrolled is also discussed.   Ms. Riche is reminded of the importance of daily foot exam, annual eye examination, and good blood sugar, blood pressure and cholesterol control.  Diabetic Labs Latest Ref Rng & Units 07/05/2017 12/21/2016 11/16/2016 06/19/2016 11/11/2015  HbA1c <5.7 % of total Hgb 6.2(H) - 5.9(H) 6.3(H) -  Microalbumin Not Estab. ug/mL - 72.5(H) - - 1.0  Micro/Creat Ratio 0.0 - 30.0 mg/g creat - 21.3 - - 9  Chol <200 mg/dL 119 -  163 154 -  HDL >50 mg/dL 56 - 40(J50(L) 47 -  Calc LDL <100 mg/dL - - 84 68 -  Triglycerides <150 mg/dL 811(B215(H) - 147143 829(F194(H) -  Creatinine 0.50 - 1.05 mg/dL 6.21(H1.09(H) - 0.86(V1.10(H) 7.840.89 -   BP/Weight 11/08/2017 07/05/2017 06/14/2017 05/23/2017 12/21/2016 09/06/2016 08/11/2016  Systolic BP 108 130 132 116 124 143 151  Diastolic BP 80 88 88 74 86 87 99  Wt. (Lbs) 198 218.75 204 212 209.8 - 212  BMI 35.07 38.75 36.14 36.39 37.16 - 37.55   Foot/eye exam completion  dates 12/21/2016 11/11/2015  Foot Form Completion Done Done        Morbid obesity (HCC) Improved, pt congratulated     Weight /BMI 11/08/2017 07/05/2017 06/14/2017  WEIGHT 198 lb 218 lb 12 oz 204 lb  HEIGHT 5\' 3"  5\' 3"  5\' 3"   BMI 35.07 kg/m2 38.75 kg/m2 36.14 kg/m2

## 2017-11-08 NOTE — Patient Instructions (Addendum)
Annual physical exam with pap in  early June ,  Call if you need me before  Last week in March, fasting lipid, cmp and EGFr, HBA1C, microalb, TSH and vitamin D  You are treated for acute left maxillary sinusitis and bronchitis  2 tablets are prescribed  Congrats on excellent weight loss , keep it up!, continue half phentermine daily

## 2017-11-09 ENCOUNTER — Encounter: Payer: Self-pay | Admitting: Family Medicine

## 2017-11-09 DIAGNOSIS — J209 Acute bronchitis, unspecified: Secondary | ICD-10-CM | POA: Insufficient documentation

## 2017-11-09 NOTE — Assessment & Plan Note (Signed)
Controlled, no change in medication DASH diet and commitment to daily physical activity for a minimum of 30 minutes discussed and encouraged, as a part of hypertension management. The importance of attaining a healthy weight is also discussed.  BP/Weight 11/08/2017 07/05/2017 06/14/2017 05/23/2017 12/21/2016 09/06/2016 08/11/2016  Systolic BP 108 130 132 116 124 143 151  Diastolic BP 80 88 88 74 86 87 99  Wt. (Lbs) 198 218.75 204 212 209.8 - 212  BMI 35.07 38.75 36.14 36.39 37.16 - 37.55

## 2017-11-09 NOTE — Assessment & Plan Note (Signed)
Antibiotic prescribed 

## 2017-11-09 NOTE — Assessment & Plan Note (Addendum)
Improved, pt congratulated     Weight /BMI 11/08/2017 07/05/2017 06/14/2017  WEIGHT 198 lb 218 lb 12 oz 204 lb  HEIGHT 5\' 3"  5\' 3"  5\' 3"   BMI 35.07 kg/m2 38.75 kg/m2 36.14 kg/m2

## 2017-11-09 NOTE — Assessment & Plan Note (Signed)
Antibiotic and decongestant prescribed 

## 2017-11-09 NOTE — Assessment & Plan Note (Signed)
Controlled, no change in medication Brittany Maxwell is reminded of the importance of commitment to daily physical activity for 30 minutes or more, as able and the need to limit carbohydrate intake to 30 to 60 grams per meal to help with blood sugar control.   The need to take medication as prescribed, test blood sugar as directed, and to call between visits if there is a concern that blood sugar is uncontrolled is also discussed.   Brittany Maxwell is reminded of the importance of daily foot exam, annual eye examination, and good blood sugar, blood pressure and cholesterol control.  Diabetic Labs Latest Ref Rng & Units 07/05/2017 12/21/2016 11/16/2016 06/19/2016 11/11/2015  HbA1c <5.7 % of total Hgb 6.2(H) - 5.9(H) 6.3(H) -  Microalbumin Not Estab. ug/mL - 72.5(H) - - 1.0  Micro/Creat Ratio 0.0 - 30.0 mg/g creat - 21.3 - - 9  Chol <200 mg/dL 161185 - 096163 045154 -  HDL >40>50 mg/dL 56 - 98(J50(L) 47 -  Calc LDL <100 mg/dL - - 84 68 -  Triglycerides <150 mg/dL 191(Y215(H) - 782143 956(O194(H) -  Creatinine 0.50 - 1.05 mg/dL 1.30(Q1.09(H) - 6.57(Q1.10(H) 4.690.89 -   BP/Weight 11/08/2017 07/05/2017 06/14/2017 05/23/2017 12/21/2016 09/06/2016 08/11/2016  Systolic BP 108 130 132 116 124 143 151  Diastolic BP 80 88 88 74 86 87 99  Wt. (Lbs) 198 218.75 204 212 209.8 - 212  BMI 35.07 38.75 36.14 36.39 37.16 - 37.55   Foot/eye exam completion dates 12/21/2016 11/11/2015  Foot Form Completion Done Done

## 2017-11-28 ENCOUNTER — Other Ambulatory Visit: Payer: Self-pay | Admitting: Family Medicine

## 2017-12-10 ENCOUNTER — Telehealth: Payer: Self-pay

## 2017-12-10 NOTE — Telephone Encounter (Signed)
Last eye exam entered

## 2017-12-18 DIAGNOSIS — E119 Type 2 diabetes mellitus without complications: Secondary | ICD-10-CM | POA: Diagnosis not present

## 2017-12-18 DIAGNOSIS — M545 Low back pain: Secondary | ICD-10-CM | POA: Diagnosis not present

## 2017-12-18 DIAGNOSIS — M5416 Radiculopathy, lumbar region: Secondary | ICD-10-CM | POA: Diagnosis not present

## 2017-12-18 DIAGNOSIS — I10 Essential (primary) hypertension: Secondary | ICD-10-CM | POA: Diagnosis not present

## 2017-12-27 ENCOUNTER — Other Ambulatory Visit: Payer: Self-pay

## 2017-12-27 MED ORDER — POTASSIUM CHLORIDE CRYS ER 20 MEQ PO TBCR: 20.0000 meq | EXTENDED_RELEASE_TABLET | Freq: Two times a day (BID) | ORAL | 0 refills | Status: DC

## 2017-12-30 ENCOUNTER — Emergency Department (HOSPITAL_COMMUNITY): Payer: BLUE CROSS/BLUE SHIELD

## 2017-12-30 ENCOUNTER — Emergency Department (HOSPITAL_COMMUNITY)
Admission: EM | Admit: 2017-12-30 | Discharge: 2017-12-30 | Disposition: A | Discharge status: Home / Self Care | Payer: BLUE CROSS/BLUE SHIELD | Attending: Emergency Medicine | Admitting: Emergency Medicine

## 2017-12-30 ENCOUNTER — Encounter (HOSPITAL_COMMUNITY): Payer: Self-pay

## 2017-12-30 ENCOUNTER — Other Ambulatory Visit: Payer: Self-pay

## 2017-12-30 DIAGNOSIS — M7731 Calcaneal spur, right foot: Secondary | ICD-10-CM | POA: Insufficient documentation

## 2017-12-30 DIAGNOSIS — M7989 Other specified soft tissue disorders: Secondary | ICD-10-CM | POA: Diagnosis not present

## 2017-12-30 DIAGNOSIS — E119 Type 2 diabetes mellitus without complications: Secondary | ICD-10-CM | POA: Insufficient documentation

## 2017-12-30 DIAGNOSIS — Z7984 Long term (current) use of oral hypoglycemic drugs: Secondary | ICD-10-CM | POA: Insufficient documentation

## 2017-12-30 DIAGNOSIS — I10 Essential (primary) hypertension: Secondary | ICD-10-CM | POA: Insufficient documentation

## 2017-12-30 DIAGNOSIS — Z79899 Other long term (current) drug therapy: Secondary | ICD-10-CM | POA: Diagnosis not present

## 2017-12-30 DIAGNOSIS — M79671 Pain in right foot: Secondary | ICD-10-CM | POA: Diagnosis not present

## 2017-12-30 DIAGNOSIS — M722 Plantar fascial fibromatosis: Secondary | ICD-10-CM | POA: Insufficient documentation

## 2017-12-30 MED ORDER — TRAMADOL HCL 50 MG PO TABS
50.0000 mg | ORAL_TABLET | Freq: Once | ORAL | Status: AC
  Administered 2017-12-30: 50 mg via ORAL
  Filled 2017-12-30: qty 1

## 2017-12-30 MED ORDER — TRAMADOL HCL 50 MG PO TABS: 50.0000 mg | ORAL_TABLET | Freq: Four times a day (QID) | ORAL | 0 refills | Status: DC | PRN

## 2017-12-30 NOTE — Discharge Instructions (Addendum)
Be sure to wear supportive shoes and do not go barefooted.  Call Dr. Loralie ChampagneMcKinney's office tomorrow to arrange a follow-up appointment.

## 2017-12-30 NOTE — ED Triage Notes (Signed)
Pt c/o r heel pain since Friday.  Denies injury.

## 2018-01-01 NOTE — ED Provider Notes (Signed)
[name] EMERGENCY DEPARTMENT Provider Note   CSN: [id number] Arrival date & time: 12/30/17  4098     History   Chief Complaint Chief Complaint  Patient presents with  . Foot Pain    HPI Brittany Maxwell is a 52 y.o. female.  HPI   Brittany Maxwell is a 52 y.o. female who presents to the Emergency Department complaining of pain to the heel of her right foot.  Symptoms began 2 days ago.  She describes a gradually worsening, sharp pain to her heel.  No known injury.  She states the pain is worse upon waking when she first stands up.  Pain gradually improves throughout the day.  She denies numbness,  swelling, redness or weakness of the foot.  No pain to the ankle.  She does admit to wearing flat shoes and walking barefoot while at home.   Past Medical History:  Diagnosis Date  . Arthritis   . Diabetes mellitus without complication (HCC)   . Hyperlipidemia   . Hypertension     Patient Active Problem List   Diagnosis Date Noted  . Acute bronchitis 11/09/2017  . Graves disease 08/11/2016  . Essential hypertension, benign 07/12/2016  . Metabolic syndrome X 11/08/2013  . Vitamin D insufficiency 07/01/2013  . Acute maxillary sinusitis 01/08/2013  . Diabetes mellitus type 2 in obese (HCC) 11/24/2012  . Hypercholesteremia 11/24/2012  . Allergic rhinitis 08/28/2011  . LOW BACK PAIN 04/19/2009  . UNSPECIFIED ACQUIRED DEFORMITY OF HIP 03/29/2009  . Morbid obesity (HCC) 02/04/2008    Past Surgical History:  Procedure Laterality Date  . CHOLECYSTECTOMY    . COLONOSCOPY N/A 05/23/2017   Procedure: COLONOSCOPY;  Surgeon: Malissa Hippo, MD;  Location: [facility] ENDO SUITE;  Service: Endoscopy;  Laterality: N/A;  930  . TUBAL LIGATION       OB History   None      Home Medications    Prior to Admission medications   Medication Sig Start Date End Date Taking? Authorizing Provider  acetaminophen (TYLENOL) 500 MG tablet Take 500 mg by mouth every 8 (eight) hours as needed  for mild pain or moderate pain.     [provider]  amLODipine (NORVASC) 10 MG tablet TAKE 1 TABLET BY MOUTH ONCE DAILY 09/20/17   [name], MD  azithromycin [facility]) 250 MG tablet Two tablets on day one, then one tablet once daily for and additional 4 days 11/08/17   Kerri Perches, MD  benzonatate (TESSALON) 100 MG capsule Take 1 capsule (100 mg total) by mouth 2 (two) times daily as needed for cough. 11/08/17   Kerri Perches, MD  cholecalciferol (VITAMIN D) 1000 units tablet Take 1,000 Units by mouth daily.    [provider]  DULoxetine (CYMBALTA) 60 MG capsule Take 60 mg by mouth every other day.    [provider]  metFORMIN (GLUCOPHAGE) 500 MG tablet TAKE 1 TABLET BY MOUTH TWICE DAILY WITH A MEAL 11/28/17   Kerri Perches, MD  Multiple Vitamin (MULTIVITAMIN) capsule Take 1 capsule by mouth daily.      [provider]  naproxen (NAPROSYN) 250 MG tablet Take 1 tablet (250 mg total) by mouth 2 (two) times daily as needed for mild pain or moderate pain (take with food). 06/14/17   Samuel Jester, DO  phentermine (ADIPEX-P) 37.5 MG tablet Take 1 tablet (37.5 mg total) by mouth daily before breakfast. 11/08/17   Kerri Perches, MD  potassium chloride SA (KLOR-CON M20) 20  MEQ tablet Take 1 tablet (20 mEq total) by mouth 2 (two) times daily. 12/27/17   Kerri PerchesSimpson, Margaret E, MD  pravastatin (PRAVACHOL) 20 MG tablet Take 1 tablet (20 mg total) by mouth daily. 02/05/17   Kerri PerchesSimpson, Margaret E, MD  traMADol (ULTRAM) 50 MG tablet Take 1 tablet (50 mg total) by mouth every 6 (six) hours as needed. 12/30/17   Vira Chaplin, PA-C  triamterene-hydrochlorothiazide (MAXZIDE) 75-50 MG tablet TAKE ONE TABLET BY MOUTH ONCE DAILY 08/14/17   Kerri PerchesSimpson, Margaret E, MD    Family History Family History  Problem Relation Age of Onset  . Hypertension Mother   . Hyperlipidemia Mother   . Stroke Father   . Hypertension Father   . Hyperlipidemia Father   .  Hypertension Sister     Social History Social History   Tobacco Use  . Smoking status: Never Smoker  . Smokeless tobacco: Never Used  Substance Use Topics  . Alcohol use: No  . Drug use: No     Allergies   Ace inhibitors and Meloxicam   Review of Systems Review of Systems  Constitutional: Negative for chills and fever.  Musculoskeletal: Positive for arthralgias (Right heel pain). Negative for joint swelling.  Skin: Negative for color change and wound.  Neurological: Negative for weakness and numbness.  All other systems reviewed and are negative.    Physical Exam Updated Vital Signs BP 132/86   Pulse 76   Temp 98.1 F (36.7 C)   Resp 18   Ht 5\' 4"  (1.626 m)   Wt 89.8 kg (198 lb)   SpO2 99%   BMI 33.99 kg/m   Physical Exam  Constitutional: She is oriented to person, place, and time. She appears well-developed and well-nourished.  HENT:  Head: Atraumatic.  Cardiovascular: Normal rate, regular rhythm and intact distal pulses.  Pulmonary/Chest: Effort normal. No respiratory distress.  Musculoskeletal: Normal range of motion. She exhibits tenderness. She exhibits no deformity.  Tenderness to palpation of the plantar surface of the right hindfoot.  Mild tenderness to the medial and lateral aspects of the hindfoot as well.  No edema.  No wounds or erythema.  Neurological: She is alert and oriented to person, place, and time. No sensory deficit.  Skin: Skin is warm. Capillary refill takes less than 2 seconds. No rash noted. No erythema.  Psychiatric: She has a normal mood and affect.  Nursing note and vitals reviewed.    ED Treatments / Results  Labs (all labs ordered are listed, but only abnormal results are displayed) Labs Reviewed - No data to display  EKG None  Radiology Dg Foot Complete Right  Result Date: 12/30/2017 CLINICAL DATA:  Pain and swelling about Achilles tendon area. No trauma history submitted. EXAM: RIGHT FOOT COMPLETE - 3+ VIEW  COMPARISON:  None. FINDINGS: No acute fracture or dislocation. Small Achilles and calcaneal spurs. Mild joint space narrowing involving the tibiotalar articulation. IMPRESSION: No acute osseous abnormality. Electronically Signed   By: Jeronimo GreavesKyle  Talbot M.D.   On: 12/30/2017 10:17     Procedures Procedures (including critical care time)  Medications Ordered in ED Medications  traMADol (ULTRAM) tablet 50 mg (50 mg Oral Given 12/30/17 0940)     Initial Impression / Assessment and Plan / ED Course  I have reviewed the triage vital signs and the nursing notes.  Pertinent labs & imaging results that were available during my care of the patient were reviewed by me and considered in my medical decision making (see chart for details).  Symptoms are likely related to plantar fasciitis.  X-ray does show calcaneal spurring.  Patient neurovascularly intact.  Discussed supportive therapy with orthotics stretching exercises of the foot and podiatry follow-up.  Patient agrees to plan.   Final Clinical Impressions(s) / ED Diagnoses   Final diagnoses:  Plantar fasciitis of right foot  Calcaneal spur of right foot    ED Discharge Orders        Ordered    traMADol (ULTRAM) 50 MG tablet  Every 6 hours PRN     12/30/17 1057       Ledyard, [place], PA-C 01/01/18 1336    Samuel Jester, DO 01/01/18 1337

## 2018-01-08 ENCOUNTER — Ambulatory Visit (INDEPENDENT_AMBULATORY_CARE_PROVIDER_SITE_OTHER): Payer: BLUE CROSS/BLUE SHIELD | Admitting: Podiatry

## 2018-01-08 ENCOUNTER — Ambulatory Visit: Payer: BLUE CROSS/BLUE SHIELD

## 2018-01-08 ENCOUNTER — Encounter: Payer: Self-pay | Admitting: Podiatry

## 2018-01-08 DIAGNOSIS — M659 Synovitis and tenosynovitis, unspecified: Secondary | ICD-10-CM

## 2018-01-08 DIAGNOSIS — M722 Plantar fascial fibromatosis: Secondary | ICD-10-CM

## 2018-01-08 MED ORDER — MELOXICAM 15 MG PO TABS: 15.0000 mg | ORAL_TABLET | Freq: Every day | ORAL | 1 refills | Status: AC

## 2018-01-10 NOTE — Progress Notes (Signed)
   Subjective:  52 year old female presenting today as a new patient with a chief complaint of stabbing pain to the right heel and achilles that began about two weeks ago. She reports associated swelling. She states the pain radiates to the dorsum of the foot and the great toe. She was seen in the ED on 12/30/17 and was diagnosed with plantar fasciitis and treated with tramadol. Walking increases the pain. She denies any known injury or trauma. Patient is here for further evaluation and treatment.   Past Medical History:  Diagnosis Date  . Arthritis   . Diabetes mellitus without complication (HCC)   . Hyperlipidemia   . Hypertension      Objective / Physical Exam:  General:  The patient is alert and oriented x3 in no acute distress. Dermatology:  Skin is warm, dry and supple bilateral lower extremities. Negative for open lesions or macerations. Vascular:  Palpable pedal pulses bilaterally. No edema or erythema noted. Capillary refill within normal limits. Neurological:  Epicritic and protective threshold grossly intact bilaterally.  Musculoskeletal Exam:  Pain on palpation to the lateral aspect of the patient's right ankle. Mild edema noted. Range of motion within normal limits to all pedal and ankle joints bilateral. Muscle strength 5/5 in all groups bilateral.   Assessment: 1. pain in right ankle 2. synovitis of right lateral ankle   Plan of Care:  1. Patient was evaluated. X-Rays in Epic reviewed.  2. injection of 0.5 mL Celestone Soluspan injected in the patient's right ankle. 3. Compression anklet dispensed.  4. CAM boot dispensed. Weightbearing as tolerated for 4 weeks.  5. Prescription for Meloxicam provided to patient.  6. Continue taking Tramadol as needed for pain.  7. Return to clinic in 4 weeks.   Felecia ShellingBrent M. Evans, DPM [facility]  Dr. Felecia ShellingBrent M. Evans, DPM    [address]                                        [place], [place] [address]                 Office [phone]  Fax [phone]

## 2018-02-05 ENCOUNTER — Ambulatory Visit (INDEPENDENT_AMBULATORY_CARE_PROVIDER_SITE_OTHER): Payer: BLUE CROSS/BLUE SHIELD | Admitting: Podiatry

## 2018-02-05 ENCOUNTER — Encounter: Payer: Self-pay | Admitting: Podiatry

## 2018-02-05 DIAGNOSIS — M659 Synovitis and tenosynovitis, unspecified: Secondary | ICD-10-CM

## 2018-02-05 DIAGNOSIS — M7751 Other enthesopathy of right foot: Secondary | ICD-10-CM

## 2018-02-05 DIAGNOSIS — M7750 Other enthesopathy of unspecified foot: Secondary | ICD-10-CM

## 2018-02-05 MED ORDER — METHYLPREDNISOLONE 4 MG PO TBPK: ORAL_TABLET | ORAL | 0 refills | Status: DC

## 2018-02-07 NOTE — Progress Notes (Signed)
   Subjective:  52 year old female with PMHx of DM presenting today for follow up evaluation of right ankle pain. She states the pain is now mostly located to the dorsum of the right foot and great toe. She states the right ankle pain has improved significantly. She reports alleviation of the pain after receiving the injection and taking Meloxicam. Walking and bearing weight increases the pain. Patient is here for further evaluation and treatment.   Past Medical History:  Diagnosis Date  . Arthritis   . Diabetes mellitus without complication (HCC)   . Hyperlipidemia   . Hypertension      Objective / Physical Exam:  General:  The patient is alert and oriented x3 in no acute distress. Dermatology:  Skin is warm, dry and supple bilateral lower extremities. Negative for open lesions or macerations. Vascular:  Palpable pedal pulses bilaterally. No edema or erythema noted. Capillary refill within normal limits. Neurological:  Epicritic and protective threshold grossly intact bilaterally.  Musculoskeletal Exam:  Pain on palpation to the lateral aspect of the patient's right ankle. Mild edema noted. Pain with palpation noted to the 1st MPJ of the right foot. Range of motion within normal limits to all pedal and ankle joints bilateral. Muscle strength 5/5 in all groups bilateral.   Assessment: 1. synovitis of right ankle 2. 1st MPJ capsulitis right    Plan of Care:  1. Patient was evaluated.  2. Injection of 0.5 mL Celestone Soluspan injected in the patient's right ankle. 3. Injection of 0.5 mLs Celestone Soluspan injected into the 1st MPJ of the right foot.  4. Ankle brace dispensed. Discontinue wearing CAM boot.  5. Recommended good shoe gear.  6. Prescription for Medrol Dose Pak provided to patient. Resume taking Meloxicam upon completion.  7. Return to clinic in 4 weeks.     Felecia Shelling, DPM [facility]  Dr. Felecia Shelling, DPM    [address]                                         [place], [place] [address]                Office [phone]  Fax [phone]

## 2018-03-05 ENCOUNTER — Ambulatory Visit: Payer: BLUE CROSS/BLUE SHIELD | Admitting: Podiatry

## 2018-03-08 ENCOUNTER — Other Ambulatory Visit: Payer: Self-pay | Admitting: Family Medicine

## 2018-03-12 ENCOUNTER — Encounter: Payer: BLUE CROSS/BLUE SHIELD | Admitting: Family Medicine

## 2018-03-20 ENCOUNTER — Other Ambulatory Visit: Payer: Self-pay

## 2018-03-20 ENCOUNTER — Encounter: Payer: Self-pay | Admitting: Family Medicine

## 2018-03-20 ENCOUNTER — Other Ambulatory Visit (HOSPITAL_COMMUNITY)
Admission: RE | Admit: 2018-03-20 | Discharge: 2018-03-20 | Disposition: A | Discharge status: Home / Self Care | Payer: BLUE CROSS/BLUE SHIELD | Source: Ambulatory Visit | Attending: Family Medicine | Admitting: Family Medicine

## 2018-03-20 ENCOUNTER — Ambulatory Visit (INDEPENDENT_AMBULATORY_CARE_PROVIDER_SITE_OTHER): Payer: BLUE CROSS/BLUE SHIELD | Admitting: Family Medicine

## 2018-03-20 VITALS — BP 122/78 | HR 95 | Ht 63.0 in | Wt 203.1 lb

## 2018-03-20 DIAGNOSIS — I10 Essential (primary) hypertension: Secondary | ICD-10-CM | POA: Diagnosis not present

## 2018-03-20 DIAGNOSIS — E7800 Pure hypercholesterolemia, unspecified: Secondary | ICD-10-CM

## 2018-03-20 DIAGNOSIS — Z0000 Encounter for general adult medical examination without abnormal findings: Secondary | ICD-10-CM | POA: Insufficient documentation

## 2018-03-20 DIAGNOSIS — Z1231 Encounter for screening mammogram for malignant neoplasm of breast: Secondary | ICD-10-CM

## 2018-03-20 DIAGNOSIS — E1169 Type 2 diabetes mellitus with other specified complication: Secondary | ICD-10-CM

## 2018-03-20 DIAGNOSIS — Z01419 Encounter for gynecological examination (general) (routine) without abnormal findings: Secondary | ICD-10-CM | POA: Diagnosis not present

## 2018-03-20 DIAGNOSIS — E669 Obesity, unspecified: Secondary | ICD-10-CM | POA: Diagnosis not present

## 2018-03-20 DIAGNOSIS — Z1239 Encounter for other screening for malignant neoplasm of breast: Secondary | ICD-10-CM

## 2018-03-20 MED ORDER — PHENTERMINE HCL 37.5 MG PO TABS: 37.5000 mg | ORAL_TABLET | Freq: Every day | ORAL | 1 refills | Status: DC

## 2018-03-20 NOTE — Patient Instructions (Addendum)
F/u in 4 months, call if you need me sooner  Please schedule mammogram at checkout this is past due  Continue half phentermine daily  Lab for next visit is fasting HBA1C, cmp and EGFR lipid, 5 days before  Please work on good  health habits so that your health will improve. 1. Commitment to daily physical activity for 30 to 60  minutes, if you are able to do this.  2. Commitment to wise food choices. Aim for half of your  food intake to be vegetable and fruit, one quarter starchy foods, and one quarter protein. Try to eat on a regular schedule  3 meals per day, snacking between meals should be limited to vegetables or fruits or small portions of nuts. 64 ounces of water per day is generally recommended, unless you have specific health conditions, like heart failure or kidney failure where you will need to limit fluid intake.  3. Commitment to sufficient and a  good quality of physical and mental rest daily, generally between 6 to 8 hours per day.  WITH PERSISTANCE AND PERSEVERANCE, THE IMPOSSIBLE , BECOMES THE NORM! It is important that you exercise regularly at least 30 minutes 5 times a week. If you develop chest pain, have severe difficulty breathing, or feel very tired, stop exercising immediately and seek medical attention  [place] loss goal of 6 to 8 pounds   Thank you  for choosing [facility]. We consider it a privelige to serve you.  Delivering excellent health care in a caring and  compassionate way is our goal.  Partnering with you,  so that together we can achieve this goal is our strategy.

## 2018-03-20 NOTE — Assessment & Plan Note (Signed)
Controlled, no change in medication Brittany Maxwell is reminded of the importance of commitment to daily physical activity for 30 minutes or more, as able and the need to limit carbohydrate intake to 30 to 60 grams per meal to help with blood sugar control.   The need to take medication as prescribed, test blood sugar as directed, and to call between visits if there is a concern that blood sugar is uncontrolled is also discussed.   Brittany Maxwell is reminded of the importance of daily foot exam, annual eye examination, and good blood sugar, blood pressure and cholesterol control.  Diabetic Labs Latest Ref Rng & Units 07/05/2017 12/21/2016 11/16/2016 06/19/2016 11/11/2015  HbA1c <5.7 % of total Hgb 6.2(H) - 5.9(H) 6.3(H) -  Microalbumin Not Estab. ug/mL - 72.5(H) - - 1.0  Micro/Creat Ratio 0.0 - 30.0 mg/g creat - 21.3 - - 9  Chol <200 mg/dL 161185 - 096163 045154 -  HDL >40>50 mg/dL 56 - 98(J50(L) 47 -  Calc LDL mg/dL (calc) 98 - 84 68 -  Triglycerides <150 mg/dL 191(Y215(H) - 782143 956(O194(H) -  Creatinine 0.50 - 1.05 mg/dL 1.30(Q1.09(H) - 6.57(Q1.10(H) 4.690.89 -   BP/Weight 03/20/2018 12/30/2017 11/08/2017 07/05/2017 06/14/2017 05/23/2017 12/21/2016  Systolic BP 122 132 108 130 132 116 124  Diastolic BP 78 86 80 88 88 74 86  Wt. (Lbs) 203.08 198 198 218.75 204 212 209.8  BMI 35.97 33.99 35.07 38.75 36.14 36.39 37.16   Foot/eye exam completion dates Latest Ref Rng & Units 02/21/2017 12/21/2016  Eye Exam No Retinopathy No Retinopathy -  Foot Form Completion - - Done

## 2018-03-20 NOTE — Progress Notes (Signed)
Brittany Maxwell     MRN: [id number]      DOB: July 25, 1966      Brittany Maxwell     MRN: [id number]      DOB: 08-Apr-1966  HPI: Patient is in for annual physical exam. cxo weight gain , wants to resume appetite suppressant, and recently seen in ED for very painful foot spur on right foot and has been unable to exercise in recent times due to pain with walking   Immunization is reviewed , and  updated .   PE: BP 122/78 (BP Location: Left Arm, Patient Position: Sitting, Cuff Size: Large)   Pulse 95   Ht 5\' 3"  (1.6 m)   Wt 203 lb 1.3 oz (92.1 kg)   SpO2 97%   BMI 35.97 kg/m   Pleasant  female, alert and oriented x 3, in no cardio-pulmonary distress. Afebrile. HEENT No facial trauma or asymetry. Sinuses non tender.  Extra occullar muscles intact, pupils equally reactive to light. External ears normal, tympanic membranes clear. Oropharynx moist, no exudate. Neck: supple, no adenopathy,JVD or thyromegaly.No bruits.  Chest: Clear to ascultation bilaterally.No crackles or wheezes. Non tender to palpation  Breast: No asymetry,no masses or lumps. No tenderness. No nipple discharge or inversion. No axillary or supraclavicular adenopathy  Cardiovascular system; Heart sounds normal,  S1 and  S2 ,no S3.  No murmur, or thrill. Apical beat not displaced Peripheral pulses normal.  Abdomen: Soft, non tender, no organomegaly or masses. No bruits. Bowel sounds normal. No guarding, tenderness or rebound.  Rectal: Deferred  GU: External genitalia normal female genitalia , normal female distribution of hair. No lesions. Urethral meatus normal in size, no  Prolapse, no lesions visibly  Present. Bladder non tender. Vagina pink and moist , with no visible lesions , discharge present . Adequate pelvic support no  cystocele or rectocele noted Cervix pink and appears healthy, no lesions or ulcerations noted, no discharge noted from os Uterus normal size, no adnexal masses, no cervical  motion or adnexal tenderness.   Musculoskeletal exam: Full ROM of spine, hips , shoulders and knees. No deformity ,swelling or crepitus noted. No muscle wasting or atrophy.   Neurologic: Cranial nerves 2 to 12 intact. Power, tone ,sensation and reflexes normal throughout. No disturbance in gait. No tremor.  Skin: Intact, no ulceration, erythema , scaling or rash noted. Pigmentation normal throughout  Psych; Normal mood and affect. Judgement and concentration normal   Assessment & Plan:  Annual physical exam Annual exam as documented. Counseling done  re healthy lifestyle involving commitment to 150 minutes exercise per week, heart healthy diet, and attaining healthy weight.The importance of adequate sleep also discussed. Regular seat belt use and home safety, is also discussed. Changes in health habits are decided on by the patient with goals and time frames  set for achieving them. Immunization and cancer screening needs are specifically addressed at this visit.   Morbid obesity (HCC) Deteriorated.Resume half phentermine daily Patient re-educated about  the importance of commitment to a  minimum of 150 minutes of exercise per week.  The importance of healthy food choices with portion control discussed. Encouraged to start a food diary, count calories and to consider  joining a support group. Sample diet sheets offered. Goals set by the patient for the next several months.   Weight /BMI 03/20/2018 12/30/2017 11/08/2017  WEIGHT 203 lb 1.3 oz 198 lb 198 lb  HEIGHT 5\' 3"  5\' 4"  5\' 3"   BMI 35.97 kg/m2 33.99 kg/m2 35.07  kg/m2      Diabetes mellitus type 2 in obese Controlled, no change in medication Brittany Maxwell is reminded of the importance of commitment to daily physical activity for 30 minutes or more, as able and the need to limit carbohydrate intake to 30 to 60 grams per meal to help with blood sugar control.   The need to take medication as prescribed, test blood sugar  as directed, and to call between visits if there is a concern that blood sugar is uncontrolled is also discussed.   Brittany Maxwell is reminded of the importance of daily foot exam, annual eye examination, and good blood sugar, blood pressure and cholesterol control.  Diabetic Labs Latest Ref Rng & Units 07/05/2017 12/21/2016 11/16/2016 06/19/2016 11/11/2015  HbA1c <5.7 % of total Hgb 6.2(H) - 5.9(H) 6.3(H) -  Microalbumin Not Estab. ug/mL - 72.5(H) - - 1.0  Micro/Creat Ratio 0.0 - 30.0 mg/g creat - 21.3 - - 9  Chol <200 mg/dL 960185 - 454163 098154 -  HDL >11>50 mg/dL 56 - 91(Y50(L) 47 -  Calc LDL mg/dL (calc) 98 - 84 68 -  Triglycerides <150 mg/dL 782(N215(H) - 562143 130(Q194(H) -  Creatinine 0.50 - 1.05 mg/dL 6.57(Q1.09(H) - 4.69(G1.10(H) 2.950.89 -   BP/Weight 03/20/2018 12/30/2017 11/08/2017 07/05/2017 06/14/2017 05/23/2017 12/21/2016  Systolic BP 122 132 108 130 132 116 124  Diastolic BP 78 86 80 88 88 74 86  Wt. (Lbs) 203.08 198 198 218.75 204 212 209.8  BMI 35.97 33.99 35.07 38.75 36.14 36.39 37.16   Foot/eye exam completion dates Latest Ref Rng & Units 02/21/2017 12/21/2016  Eye Exam No Retinopathy No Retinopathy -  Foot Form Completion - - Done

## 2018-03-20 NOTE — Assessment & Plan Note (Signed)

## 2018-03-20 NOTE — Assessment & Plan Note (Addendum)
Deteriorated.Resume half phentermine daily Patient re-educated about  the importance of commitment to a  minimum of 150 minutes of exercise per week.  The importance of healthy food choices with portion control discussed. Encouraged to start a food diary, count calories and to consider  joining a support group. Sample diet sheets offered. Goals set by the patient for the next several months.   Weight /BMI 03/20/2018 12/30/2017 11/08/2017  WEIGHT 203 lb 1.3 oz 198 lb 198 lb  HEIGHT 5\' 3"  5\' 4"  5\' 3"   BMI 35.97 kg/m2 33.99 kg/m2 35.07 kg/m2

## 2018-03-26 ENCOUNTER — Encounter: Payer: Self-pay | Admitting: Family Medicine

## 2018-03-26 LAB — CYTOLOGY - PAP
CHLAMYDIA, DNA PROBE: NEGATIVE
Candida vaginitis: NEGATIVE
Diagnosis: NEGATIVE
HPV: NOT DETECTED
Neisseria Gonorrhea: NEGATIVE
Trichomonas: NEGATIVE

## 2018-03-28 ENCOUNTER — Ambulatory Visit (HOSPITAL_COMMUNITY)
Admission: RE | Admit: 2018-03-28 | Discharge: 2018-03-28 | Disposition: A | Discharge status: Home / Self Care | Payer: BLUE CROSS/BLUE SHIELD | Source: Ambulatory Visit | Attending: Family Medicine | Admitting: Family Medicine

## 2018-03-28 DIAGNOSIS — Z1231 Encounter for screening mammogram for malignant neoplasm of breast: Secondary | ICD-10-CM | POA: Insufficient documentation

## 2018-03-28 DIAGNOSIS — Z1239 Encounter for other screening for malignant neoplasm of breast: Secondary | ICD-10-CM

## 2018-04-02 ENCOUNTER — Other Ambulatory Visit: Payer: Self-pay | Admitting: Family Medicine

## 2018-04-19 IMAGING — DX DG FOOT COMPLETE 3+V*L*
3 series · 3 of 3 positions shown · non-contrast
Comparison: No recent prior .

CLINICAL DATA: Injury.  Difficulty bearing weight.

EXAM:
LEFT FOOT - COMPLETE 3+ VIEW

[foot ap]
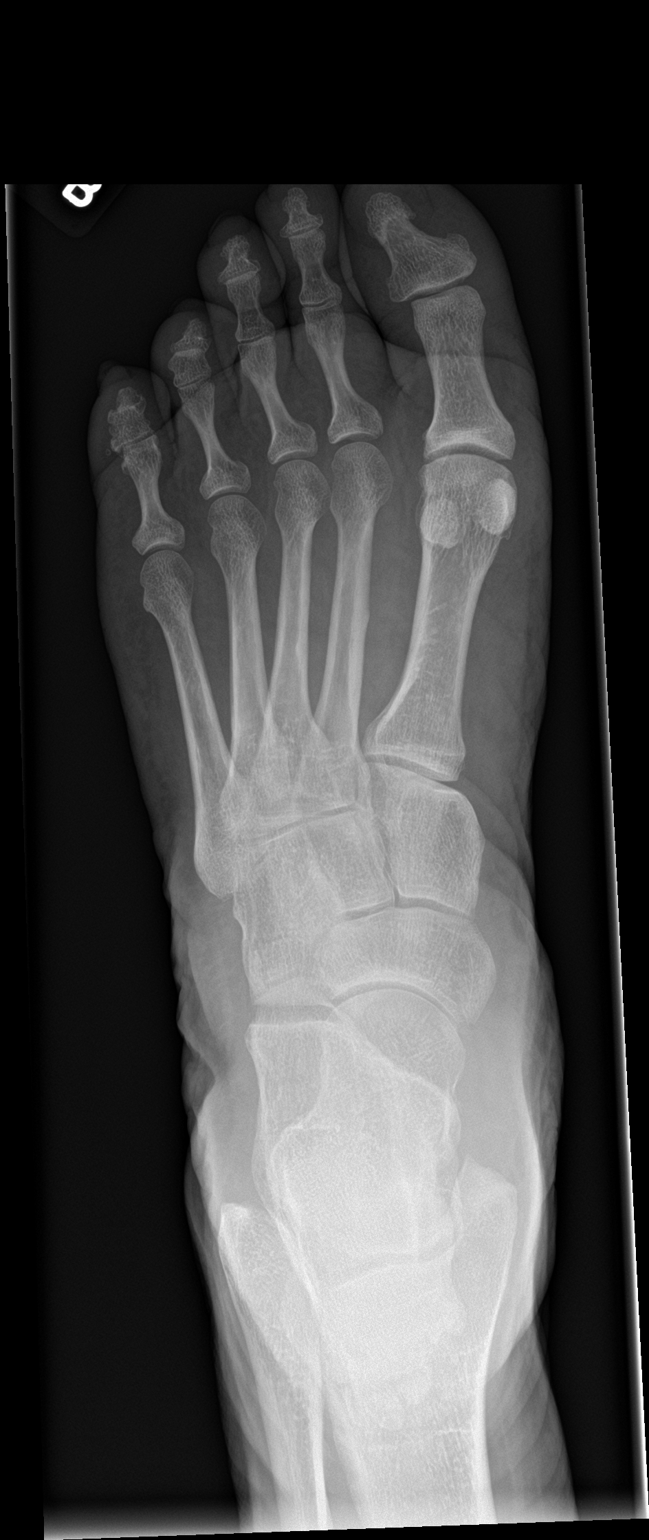

[foot obl]
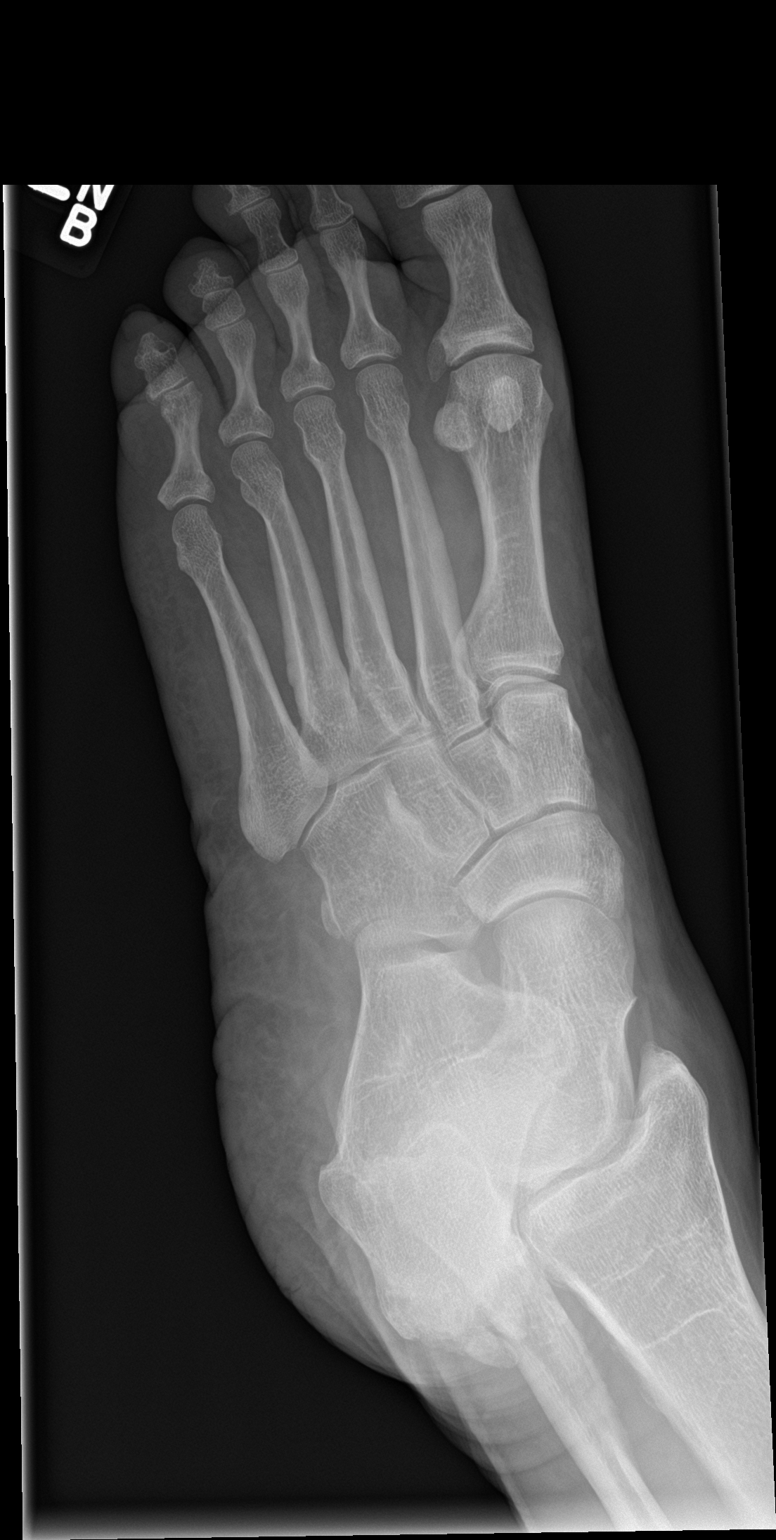

[foot lat]
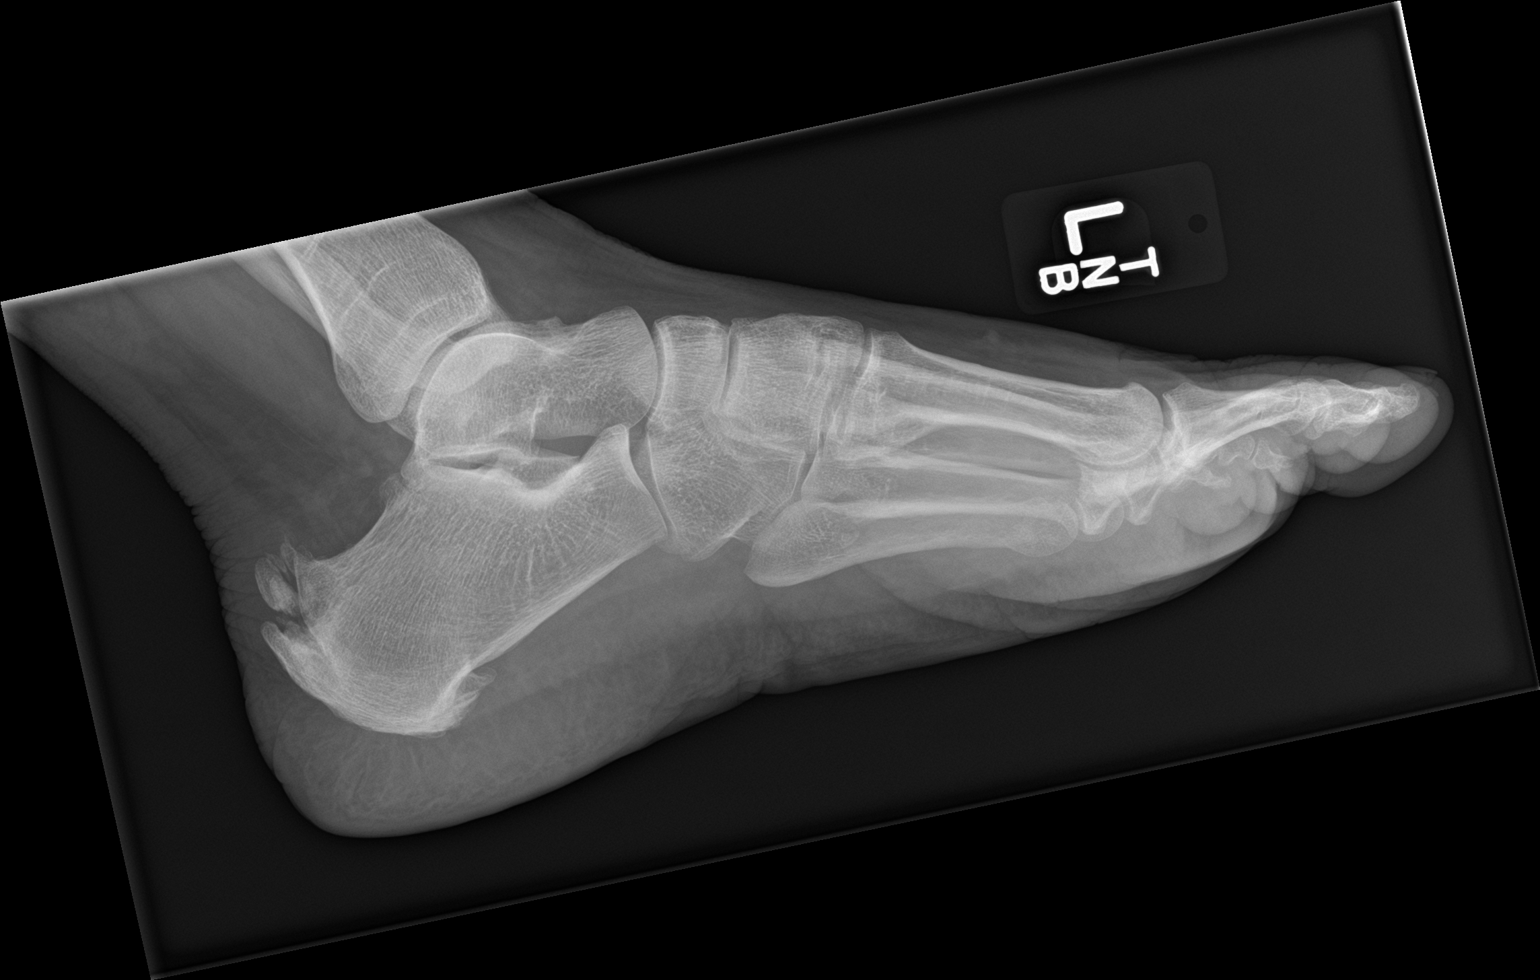

[3 of 3 positions shown; findings below may reference images not displayed]

FINDINGS: There is no evidence of fracture or dislocation. There is no
evidence of arthropathy or other focal bone abnormality. Soft
tissues are unremarkable.
IMPRESSION: No acute abnormality identified. No evidence of fracture or
dislocation.

## 2018-05-14 ENCOUNTER — Other Ambulatory Visit: Payer: Self-pay | Admitting: Family Medicine

## 2018-07-08 ENCOUNTER — Other Ambulatory Visit: Payer: Self-pay | Admitting: Family Medicine

## 2018-07-22 ENCOUNTER — Ambulatory Visit: Payer: BLUE CROSS/BLUE SHIELD | Admitting: Family Medicine

## 2018-08-14 ENCOUNTER — Telehealth: Payer: Self-pay

## 2018-08-14 ENCOUNTER — Telehealth: Payer: Self-pay | Admitting: Family Medicine

## 2018-08-14 DIAGNOSIS — Z0000 Encounter for general adult medical examination without abnormal findings: Secondary | ICD-10-CM

## 2018-08-14 DIAGNOSIS — E7800 Pure hypercholesterolemia, unspecified: Secondary | ICD-10-CM

## 2018-08-14 DIAGNOSIS — E0500 Thyrotoxicosis with diffuse goiter without thyrotoxic crisis or storm: Secondary | ICD-10-CM

## 2018-08-14 DIAGNOSIS — E669 Obesity, unspecified: Secondary | ICD-10-CM

## 2018-08-14 DIAGNOSIS — E1169 Type 2 diabetes mellitus with other specified complication: Secondary | ICD-10-CM

## 2018-08-14 DIAGNOSIS — E559 Vitamin D deficiency, unspecified: Secondary | ICD-10-CM

## 2018-08-14 DIAGNOSIS — I10 Essential (primary) hypertension: Secondary | ICD-10-CM

## 2018-08-14 NOTE — Telephone Encounter (Signed)
Got it done!!!

## 2018-08-14 NOTE — Telephone Encounter (Signed)
Please order labs, and call the patient

## 2018-08-14 NOTE — Telephone Encounter (Signed)
P[ls order cBC, cmp and EGFR, hBA1C, lipid, tSH and vit D

## 2018-08-14 NOTE — Telephone Encounter (Signed)
Labs ordered, patient notified

## 2018-08-14 NOTE — Telephone Encounter (Signed)
What labs do you want ordered? 

## 2018-09-10 DIAGNOSIS — E7800 Pure hypercholesterolemia, unspecified: Secondary | ICD-10-CM | POA: Diagnosis not present

## 2018-09-10 DIAGNOSIS — I10 Essential (primary) hypertension: Secondary | ICD-10-CM | POA: Diagnosis not present

## 2018-09-10 DIAGNOSIS — E669 Obesity, unspecified: Secondary | ICD-10-CM | POA: Diagnosis not present

## 2018-09-10 DIAGNOSIS — E0500 Thyrotoxicosis with diffuse goiter without thyrotoxic crisis or storm: Secondary | ICD-10-CM | POA: Diagnosis not present

## 2018-09-10 DIAGNOSIS — E559 Vitamin D deficiency, unspecified: Secondary | ICD-10-CM | POA: Diagnosis not present

## 2018-09-10 DIAGNOSIS — E1169 Type 2 diabetes mellitus with other specified complication: Secondary | ICD-10-CM | POA: Diagnosis not present

## 2018-09-11 ENCOUNTER — Encounter: Payer: Self-pay | Admitting: Family Medicine

## 2018-09-11 LAB — HEMOGLOBIN A1C
EAG (MMOL/L): 7.3 (calc)
Hgb A1c MFr Bld: 6.2 % of total Hgb — ABNORMAL HIGH (ref <5.7)
Mean Plasma Glucose: 131 (calc)

## 2018-09-11 LAB — COMPLETE METABOLIC PANEL WITH GFR
AG Ratio: 1.1 (calc) (ref 1.0–2.5)
ALBUMIN MSPROF: 4.3 g/dL (ref 3.6–5.1)
ALT: 18 U/L (ref 6–29)
AST: 19 U/L (ref 10–35)
Alkaline phosphatase (APISO): 76 U/L (ref 33–130)
BUN/Creatinine Ratio: 12 (calc) (ref 6–22)
BUN: 14 mg/dL (ref 7–25)
CALCIUM: 10.2 mg/dL (ref 8.6–10.4)
CO2: 32 mmol/L (ref 20–32)
CREATININE: 1.15 mg/dL — AB (ref 0.50–1.05)
Chloride: 101 mmol/L (ref 98–110)
GFR, EST NON AFRICAN AMERICAN: 54 mL/min/{1.73_m2} — AB (ref >=60)
GFR, Est African American: 63 mL/min/{1.73_m2} (ref >=60)
Globulin: 3.8 g/dL (calc) — ABNORMAL HIGH (ref 1.9–3.7)
Glucose, Bld: 91 mg/dL (ref 65–99)
Potassium: 3.4 mmol/L — ABNORMAL LOW (ref 3.5–5.3)
Sodium: 140 mmol/L (ref 135–146)
Total Bilirubin: 0.4 mg/dL (ref 0.2–1.2)
Total Protein: 8.1 g/dL (ref 6.1–8.1)

## 2018-09-11 LAB — CBC
HEMATOCRIT: 39.2 % (ref 35.0–45.0)
Hemoglobin: 13.2 g/dL (ref 11.7–15.5)
MCH: 28.5 pg (ref 27.0–33.0)
MCHC: 33.7 g/dL (ref 32.0–36.0)
MCV: 84.7 fL (ref 80.0–100.0)
MPV: 10 fL (ref 7.5–12.5)
Platelets: 409 10*3/uL — ABNORMAL HIGH (ref 140–400)
RBC: 4.63 10*6/uL (ref 3.80–5.10)
RDW: 13.6 % (ref 11.0–15.0)
WBC: 7.7 10*3/uL (ref 3.8–10.8)

## 2018-09-11 LAB — VITAMIN D 25 HYDROXY (VIT D DEFICIENCY, FRACTURES): VIT D 25 HYDROXY: 29 ng/mL — AB (ref 30–100)

## 2018-09-11 LAB — LIPID PANEL
Cholesterol: 212 mg/dL — ABNORMAL HIGH (ref <200)
HDL: 51 mg/dL (ref >50)
LDL Cholesterol (Calc): 139 mg/dL (calc) — ABNORMAL HIGH
Non-HDL Cholesterol (Calc): 161 mg/dL (calc) — ABNORMAL HIGH (ref <130)
Total CHOL/HDL Ratio: 4.2 (calc) (ref <5.0)
Triglycerides: 108 mg/dL (ref <150)

## 2018-09-11 LAB — TSH: TSH: 1.27 mIU/L

## 2018-09-12 ENCOUNTER — Ambulatory Visit (INDEPENDENT_AMBULATORY_CARE_PROVIDER_SITE_OTHER): Payer: BLUE CROSS/BLUE SHIELD | Admitting: Family Medicine

## 2018-09-12 ENCOUNTER — Encounter: Payer: Self-pay | Admitting: Family Medicine

## 2018-09-12 ENCOUNTER — Other Ambulatory Visit (HOSPITAL_COMMUNITY)
Admission: RE | Admit: 2018-09-12 | Discharge: 2018-09-12 | Disposition: A | Discharge status: Home / Self Care | Payer: BLUE CROSS/BLUE SHIELD | Source: Other Acute Inpatient Hospital | Attending: Family Medicine | Admitting: Family Medicine

## 2018-09-12 VITALS — BP 140/96 | HR 88 | Resp 15 | Ht 63.0 in | Wt 205.0 lb

## 2018-09-12 DIAGNOSIS — Z23 Encounter for immunization: Secondary | ICD-10-CM | POA: Diagnosis not present

## 2018-09-12 DIAGNOSIS — E7800 Pure hypercholesterolemia, unspecified: Secondary | ICD-10-CM | POA: Diagnosis not present

## 2018-09-12 DIAGNOSIS — T162XXA Foreign body in left ear, initial encounter: Secondary | ICD-10-CM

## 2018-09-12 DIAGNOSIS — E669 Obesity, unspecified: Secondary | ICD-10-CM

## 2018-09-12 DIAGNOSIS — I10 Essential (primary) hypertension: Secondary | ICD-10-CM

## 2018-09-12 DIAGNOSIS — E1169 Type 2 diabetes mellitus with other specified complication: Secondary | ICD-10-CM | POA: Insufficient documentation

## 2018-09-12 MED ORDER — PRAVASTATIN SODIUM 40 MG PO TABS: 40.0000 mg | ORAL_TABLET | Freq: Every day | ORAL | 3 refills | Status: DC

## 2018-09-12 MED ORDER — PHENTERMINE HCL 37.5 MG PO TABS: 37.5000 mg | ORAL_TABLET | Freq: Every day | ORAL | 1 refills | Status: DC

## 2018-09-12 NOTE — Patient Instructions (Addendum)
F/u in 5 months, call if you need me before  Microalb from office today   Flu vaccine today  You are referred to ENT for removal of foreign body in left ear  Brittany BoomDaniel diet is healthiest way to eat 365 days per year!!!  Increase dose of pravastatin to 40 mg daily  Please cut back on fried and fatty foods  Phentermine half tab daily is prescribed ( written as one daily , take half)\   Fasting lipid, cmp andeGFR and hBA1C 1 week before follow up  Weight loss goal of 10 to 12 pounds  Thanks for choosing [facility], we consider it a privelige to serve you.

## 2018-09-13 LAB — MICROALBUMIN / CREATININE URINE RATIO
Creatinine, Urine: 170.4 mg/dL
MICROALB UR: 45.2 ug/mL — AB
Microalb Creat Ratio: 26.5 mg/g creat (ref 0.0–30.0)

## 2018-09-14 ENCOUNTER — Encounter: Payer: Self-pay | Admitting: Family Medicine

## 2018-09-14 DIAGNOSIS — T162XXA Foreign body in left ear, initial encounter: Secondary | ICD-10-CM | POA: Insufficient documentation

## 2018-09-14 NOTE — Assessment & Plan Note (Signed)
Controlled, no change in medication [name] is reminded of the importance of commitment to daily physical activity for 30 minutes or more, as able and the need to limit carbohydrate intake to 30 to 60 grams per meal to help with blood sugar control.   The need to take medication as prescribed, test blood sugar as directed, and to call between visits if there is a concern that blood sugar is uncontrolled is also discussed.   [name] is reminded of the importance of daily foot exam, annual eye examination, and good blood sugar, blood pressure and cholesterol control.  Diabetic Labs Latest Ref Rng & Units 09/12/2018 09/10/2018 07/05/2017 12/21/2016 11/16/2016  HbA1c <5.7 % of total Hgb - 6.2(H) 6.2(H) - 5.9(H)  Microalbumin Not Estab. ug/mL 45.2(H) - - 72.5(H) -  Micro/Creat Ratio 0.0 - 30.0 mg/g creat 26.5 - - 21.3 -  Chol <200 mg/dL - 161(W212(H) 960185 - 454163  HDL >50 mg/dL - 51 56 - 09(W50(L)  Calc LDL mg/dL (calc) - 119(J139(H) 98 - 84  Triglycerides <150 mg/dL - 478108 295(A215(H) - 213143  Creatinine 0.50 - 1.05 mg/dL - 0.86(V1.15(H) 7.84(O1.09(H) - 9.62(X1.10(H)   BP/Weight 09/12/2018 03/20/2018 12/30/2017 11/08/2017 07/05/2017 06/14/2017 05/23/2017  Systolic BP 140 122 132 108 130 132 116  Diastolic BP 96 78 86 80 88 88 74  Wt. (Lbs) 205 203.08 198 198 218.75 204 212  BMI 36.31 35.97 33.99 35.07 38.75 36.14 36.39   Foot/eye exam completion dates Latest Ref Rng & Units 03/20/2018 02/21/2017  Eye Exam No Retinopathy - No Retinopathy  Foot Form Completion - Done -

## 2018-09-14 NOTE — Progress Notes (Signed)
Brittany Maxwell     MRN: [id number]      DOB: 06/21/1966   HPI [name] is here for follow up and re-evaluation of chronic medical conditions, medication management and review of any available recent lab and radiology data.  Preventive health is updated, specifically  Cancer screening and Immunization.   Questions or concerns regarding consultations or procedures which the PT has had in the interim are  addressed. The PT denies any adverse reactions to current medications since the last visit.  Left ear discomfort x 1 week, has uses hairpin to try to relieve symptom, no hearing loss  ROS Denies recent fever or chills. Denies sinus pressure, nasal congestion,  or sore throat. Denies chest congestion, productive cough or wheezing. Denies chest pains, palpitations and leg swelling Denies abdominal pain, nausea, vomiting,diarrhea or constipation.   Denies dysuria, frequency, hesitancy or incontinence. Denies joint pain, swelling and limitation in mobility. Denies headaches, seizures, numbness, or tingling. Denies depression, anxiety or insomnia. Denies skin break down or rash.   PE  BP (!) 140/96   Pulse 88   Resp 15   Ht 5\' 3"  (1.6 m)   Wt 205 lb (93 kg)   SpO2 96%   BMI 36.31 kg/m   Patient alert and oriented and in no cardiopulmonary distress.  HEENT: No facial asymmetry, EOMI,   oropharynx pink and moist.  Neck supple no JVD, no mass.Left outer ear has retained foreign body that resembles a metal ring,no sinus tenderness, right TM clear  Chest: Clear to auscultation bilaterally.  CVS: S1, S2 no murmurs, no S3.Regular rate.  ABD: Soft non tender.   Ext: No edema  MS: Adequate ROM spine, shoulders, hips and knees.  Skin: Intact, no ulcerations or rash noted.  Psych: Good eye contact, normal affect. Memory intact not anxious or depressed appearing.  CNS: CN 2-12 intact, power,  normal throughout.no focal deficits noted.   Assessment & Plan  Essential  hypertension, benign Uncontrolled,needs to take medication consistently DASH diet and commitment to daily physical activity for a minimum of 30 minutes discussed and encouraged, as a part of hypertension management. The importance of attaining a healthy weight is also discussed.  BP/Weight 09/12/2018 03/20/2018 12/30/2017 11/08/2017 07/05/2017 06/14/2017 05/23/2017  Systolic BP 140 122 132 108 130 132 116  Diastolic BP 96 78 86 80 88 88 74  Wt. (Lbs) 205 203.08 198 198 218.75 204 212  BMI 36.31 35.97 33.99 35.07 38.75 36.14 36.39       Diabetes mellitus type 2 in obese Controlled, no change in medication [name] is reminded of the importance of commitment to daily physical activity for 30 minutes or more, as able and the need to limit carbohydrate intake to 30 to 60 grams per meal to help with blood sugar control.   The need to take medication as prescribed, test blood sugar as directed, and to call between visits if there is a concern that blood sugar is uncontrolled is also discussed.   [name] is reminded of the importance of daily foot exam, annual eye examination, and good blood sugar, blood pressure and cholesterol control.  Diabetic Labs Latest Ref Rng & Units 09/12/2018 09/10/2018 07/05/2017 12/21/2016 11/16/2016  HbA1c <5.7 % of total Hgb - 6.2(H) 6.2(H) - 5.9(H)  Microalbumin Not Estab. ug/mL 45.2(H) - - 72.5(H) -  Micro/Creat Ratio 0.0 - 30.0 mg/g creat 26.5 - - 21.3 -  Chol <200 mg/dL - 409(W212(H) 119185 - 147163  HDL >50 mg/dL -  51 56 - 50(L)  Calc LDL mg/dL (calc) - 782(N) 98 - 84  Triglycerides <150 mg/dL - 562 130(Q) - 657  Creatinine 0.50 - 1.05 mg/dL - 8.46(N) 6.29(B) - 2.84(X)   BP/Weight 09/12/2018 03/20/2018 12/30/2017 11/08/2017 07/05/2017 06/14/2017 05/23/2017  Systolic BP 140 122 132 108 130 132 116  Diastolic BP 96 78 86 80 88 88 74  Wt. (Lbs) 205 203.08 198 198 218.75 204 212  BMI 36.31 35.97 33.99 35.07 38.75 36.14 36.39   Foot/eye exam completion dates Latest Ref  Rng & Units 03/20/2018 02/21/2017  Eye Exam No Retinopathy - No Retinopathy  Foot Form Completion - Done -        Morbid obesity (HCC) Deteriorated, obesity linked with hypertension and diabetes Patient re-educated about  the importance of commitment to a  minimum of 150 minutes of exercise per week.  The importance of healthy food choices with portion control discussed. Encouraged to start a food diary, count calories and to consider  joining a support group. Sample diet sheets offered. Goals set by the patient for the next several months.   Weight /BMI 09/12/2018 03/20/2018 12/30/2017  WEIGHT 205 lb 203 lb 1.3 oz 198 lb  HEIGHT 5\' 3"  5\' 3"  5\' 4"   BMI 36.31 kg/m2 35.97 kg/m2 33.99 kg/m2      Foreign body in auricle of left ear Symptomatic with retained foreign body, refer to ENT

## 2018-09-14 NOTE — Assessment & Plan Note (Signed)
Deteriorated, obesity linked with hypertension and diabetes Patient re-educated about  the importance of commitment to a  minimum of 150 minutes of exercise per week.  The importance of healthy food choices with portion control discussed. Encouraged to start a food diary, count calories and to consider  joining a support group. Sample diet sheets offered. Goals set by the patient for the next several months.   Weight /BMI 09/12/2018 03/20/2018 12/30/2017  WEIGHT 205 lb 203 lb 1.3 oz 198 lb  HEIGHT 5\' 3"  5\' 3"  5\' 4"   BMI 36.31 kg/m2 35.97 kg/m2 33.99 kg/m2

## 2018-09-14 NOTE — Assessment & Plan Note (Signed)
Uncontrolled,needs to take medication consistently DASH diet and commitment to daily physical activity for a minimum of 30 minutes discussed and encouraged, as a part of hypertension management. The importance of attaining a healthy weight is also discussed.  BP/Weight 09/12/2018 03/20/2018 12/30/2017 11/08/2017 07/05/2017 06/14/2017 05/23/2017  Systolic BP 140 122 132 108 130 132 116  Diastolic BP 96 78 86 80 88 88 74  Wt. (Lbs) 205 203.08 198 198 218.75 204 212  BMI 36.31 35.97 33.99 35.07 38.75 36.14 36.39

## 2018-09-14 NOTE — Assessment & Plan Note (Signed)
Symptomatic with retained foreign body, refer to ENT

## 2018-09-17 DIAGNOSIS — T162XXA Foreign body in left ear, initial encounter: Secondary | ICD-10-CM | POA: Diagnosis not present

## 2018-09-17 DIAGNOSIS — K08409 Partial loss of teeth, unspecified cause, unspecified class: Secondary | ICD-10-CM | POA: Diagnosis not present

## 2018-09-17 DIAGNOSIS — J342 Deviated nasal septum: Secondary | ICD-10-CM | POA: Diagnosis not present

## 2018-11-06 ENCOUNTER — Telehealth: Payer: Self-pay | Admitting: Family Medicine

## 2018-11-06 NOTE — Telephone Encounter (Signed)
Disability forms  Copied Noted sleeved 

## 2018-11-12 NOTE — Telephone Encounter (Signed)
Forms are completed,  Pt notified.

## 2018-11-14 DIAGNOSIS — E7800 Pure hypercholesterolemia, unspecified: Secondary | ICD-10-CM

## 2018-11-14 DIAGNOSIS — I10 Essential (primary) hypertension: Secondary | ICD-10-CM

## 2018-11-26 ENCOUNTER — Other Ambulatory Visit: Payer: Self-pay | Admitting: Family Medicine

## 2019-02-07 DIAGNOSIS — I10 Essential (primary) hypertension: Secondary | ICD-10-CM | POA: Diagnosis not present

## 2019-02-07 DIAGNOSIS — E119 Type 2 diabetes mellitus without complications: Secondary | ICD-10-CM | POA: Diagnosis not present

## 2019-02-07 DIAGNOSIS — M5416 Radiculopathy, lumbar region: Secondary | ICD-10-CM | POA: Diagnosis not present

## 2019-02-07 DIAGNOSIS — M545 Low back pain: Secondary | ICD-10-CM | POA: Diagnosis not present

## 2019-02-10 ENCOUNTER — Encounter: Payer: Self-pay | Admitting: Family Medicine

## 2019-02-10 MED ORDER — METFORMIN HCL 500 MG PO TABS: ORAL_TABLET | ORAL | 1 refills | Status: DC

## 2019-02-10 MED ORDER — POTASSIUM CHLORIDE CRYS ER 20 MEQ PO TBCR: 20.0000 meq | EXTENDED_RELEASE_TABLET | Freq: Two times a day (BID) | ORAL | 1 refills | Status: DC

## 2019-02-10 MED ORDER — TRIAMTERENE-HCTZ 75-50 MG PO TABS: 1.0000 | ORAL_TABLET | Freq: Every day | ORAL | 1 refills | Status: DC

## 2019-02-10 MED ORDER — AMLODIPINE BESYLATE 10 MG PO TABS: 10.0000 mg | ORAL_TABLET | Freq: Every day | ORAL | 1 refills | Status: DC

## 2019-02-11 ENCOUNTER — Ambulatory Visit (INDEPENDENT_AMBULATORY_CARE_PROVIDER_SITE_OTHER): Payer: BLUE CROSS/BLUE SHIELD | Admitting: Family Medicine

## 2019-02-11 VITALS — BP 156/108 | Ht 63.0 in | Wt 198.6 lb

## 2019-02-11 DIAGNOSIS — Z1239 Encounter for other screening for malignant neoplasm of breast: Secondary | ICD-10-CM

## 2019-02-11 DIAGNOSIS — E7800 Pure hypercholesterolemia, unspecified: Secondary | ICD-10-CM

## 2019-02-11 DIAGNOSIS — I10 Essential (primary) hypertension: Secondary | ICD-10-CM

## 2019-02-11 DIAGNOSIS — E1169 Type 2 diabetes mellitus with other specified complication: Secondary | ICD-10-CM | POA: Diagnosis not present

## 2019-02-11 DIAGNOSIS — Z7189 Other specified counseling: Secondary | ICD-10-CM

## 2019-02-11 DIAGNOSIS — M5440 Lumbago with sciatica, unspecified side: Secondary | ICD-10-CM

## 2019-02-11 DIAGNOSIS — G8929 Other chronic pain: Secondary | ICD-10-CM

## 2019-02-11 DIAGNOSIS — E669 Obesity, unspecified: Secondary | ICD-10-CM

## 2019-02-11 DIAGNOSIS — J301 Allergic rhinitis due to pollen: Secondary | ICD-10-CM

## 2019-02-11 MED ORDER — CLONIDINE HCL 0.1 MG PO TABS: ORAL_TABLET | ORAL | 1 refills | Status: DC

## 2019-02-11 MED ORDER — LORATADINE 10 MG PO TABS: 10.0000 mg | ORAL_TABLET | Freq: Every day | ORAL | 1 refills | Status: DC

## 2019-02-11 NOTE — Assessment & Plan Note (Addendum)
Uncontrolled, needs office eval in 4 weeks Add clonidine  0.1 mg at bedtime DASH diet and commitment to daily physical activity for a minimum of 30 minutes discussed and encouraged, as a part of hypertension management. The importance of attaining a healthy weight is also discussed.  BP/Weight 02/11/2019 09/12/2018 03/20/2018 12/30/2017 11/08/2017 07/05/2017 06/14/2017  Systolic BP 156 140 122 132 108 130 132  Diastolic BP 108 96 78 86 80 88 88  Wt. (Lbs) 198.6 205 203.08 198 198 218.75 204  BMI 35.18 36.31 35.97 33.99 35.07 38.75 36.14   needs ov in next 2 weeks

## 2019-02-11 NOTE — Progress Notes (Signed)
Virtual Visit via Telephone Note  I connected with Brittany Maxwell on 02/11/19 at  8:00 AM EDT by telephone and verified that I am speaking with the correct person using two identifiers.  Location: Patient: home Provider: office   I discussed the limitations, risks, security and privacy concerns of performing an evaluation and management service by telephone and the availability of in person appointments. I also discussed with the patient that there may be a patient responsible charge related to this service. The patient expressed understanding and agreed to proceed. This visit type is conducted due to national recommendations for restrictions regarding the COVID -19 Pandemic. Due to the patient's age and / or co morbidities, this format is felt to be most appropriate at this time without adequate follow up. The patient has no access to video technology/ had technical difficulties with video, requiring transitioning to audio format  only ( telephone ). All issues noted this document were discussed and addressed,no physical exam can be performed in this format.    History of Present Illness: F/u chronic problems and medication review Denies recent fever or chills. Denies sinus pressure,c/o increased  nasal congestion, and allergy symptoms, denies ear pain or sore throat. Denies chest congestion, productive cough or wheezing. Denies chest pains, palpitations and leg swelling Denies abdominal pain, nausea, vomiting,diarrhea or constipation.   Denies dysuria, frequency, hesitancy or incontinence. Denies uncontrolled  joint pain, swelling and limitation in mobility. Denies headaches, seizures, numbness, or tingling. Denies depression, anxiety or insomnia. Denies skin break down or rash.       Observations/Objective: BP (!) 156/108 Comment: had not taken med  Ht 5\' 3"  (1.6 m)   Wt 198 lb 9.6 oz (90.1 kg)   BMI 35.18 kg/m  Good communication with no confusion and intact  memory. Alert and oriented x 3 No signs of respiratory distress during sppech    Assessment and Plan:  Essential hypertension, benign  Uncontrolled, needs office eval in 4 weeks Add clonidine  0.1 mg at bedtime DASH diet and commitment to daily physical activity for a minimum of 30 minutes discussed and encouraged, as a part of hypertension management. The importance of attaining a healthy weight is also discussed.  BP/Weight 02/11/2019 09/12/2018 03/20/2018 12/30/2017 11/08/2017 07/05/2017 06/14/2017  Systolic BP 156 140 122 132 108 130 132  Diastolic BP 108 96 78 86 80 88 88  Wt. (Lbs) 198.6 205 203.08 198 198 218.75 204  BMI 35.18 36.31 35.97 33.99 35.07 38.75 36.14   needs ov in next 2 weeks    Diabetes mellitus type 2 in obese Brittany Maxwell is reminded of the importance of commitment to daily physical activity for 30 minutes or more, as able and the need to limit carbohydrate intake to 30 to 60 grams per meal to help with blood sugar control.   The need to take medication as prescribed, test blood sugar as directed, and to call between visits if there is a concern that blood sugar is uncontrolled is also discussed.   Brittany Maxwell is reminded of the importance of daily foot exam, annual eye examination, and good blood sugar, blood pressure and cholesterol control. Updated lab needed  Diabetic Labs Latest Ref Rng & Units 09/12/2018 09/10/2018 07/05/2017 12/21/2016 11/16/2016  HbA1c <5.7 % of total Hgb - 6.2(H) 6.2(H) - 5.9(H)  Microalbumin Not Estab. ug/mL 45.2(H) - - 72.5(H) -  Micro/Creat Ratio 0.0 - 30.0 mg/g creat 26.5 - - 21.3 -  Chol <200 mg/dL - 659(D) 357 -  163  HDL >50 mg/dL - 51 56 - 81(X50(L)  Calc LDL mg/dL (calc) - 914(N139(H) 98 - 84  Triglycerides <150 mg/dL - 829108 562(Z215(H) - 308143  Creatinine 0.50 - 1.05 mg/dL - 6.57(Q1.15(H) 4.69(G1.09(H) - 2.95(M1.10(H)   BP/Weight 02/11/2019 09/12/2018 03/20/2018 12/30/2017 11/08/2017 07/05/2017 06/14/2017  Systolic BP 156 140 122 132 108 130 132  Diastolic BP 108  96 78 86 80 88 88  Wt. (Lbs) 198.6 205 203.08 198 198 218.75 204  BMI 35.18 36.31 35.97 33.99 35.07 38.75 36.14   Foot/eye exam completion dates Latest Ref Rng & Units 03/20/2018 02/21/2017  Eye Exam No Retinopathy - No Retinopathy  Foot Form Completion - Done -        Hypercholesteremia Hyperlipidemia:Low fat diet discussed and encouraged.   Lipid Panel  Lab Results  Component Value Date   CHOL 212 (H) 09/10/2018   HDL 51 09/10/2018   LDLCALC 139 (H) 09/10/2018   TRIG 108 09/10/2018   CHOLHDL 4.2 09/10/2018   Uncontrolled when last checked Updated lab needed    Morbid obesity (HCC) Obesity linked with hypertension and diabetes nd chronic back pain, improved, she is applauded on this Patient re-educated about  the importance of commitment to a  minimum of 150 minutes of exercise per week as able.  The importance of healthy food choices with portion control discussed, as well as eating regularly and within a 12 hour window most days. The need to choose "clean , green" food 50 to 75% of the time is discussed, as well as to make water the primary drink and set a goal of 64 ounces water daily.     Weight /BMI 02/11/2019 09/12/2018 03/20/2018  WEIGHT 198 lb 9.6 oz 205 lb 203 lb 1.3 oz  HEIGHT 5\' 3"  5\' 3"  5\' 3"   BMI 35.18 kg/m2 36.31 kg/m2 35.97 kg/m2      LOW BACK PAIN Managed by pain management and controlled  Educated About Covid-19 Virus Infection Covid-19 Education  The signs and symptoms of of COVID -19 were discussed with the patient and how to seek care for testing. ( follow up with PCP or arrange  E-visit) The importance of social  distancing is discussed today.   Allergic rhinitis Increased and uncontrolled with season change,  Will start daily medication   Follow Up Instructions:    I discussed the assessment and treatment plan with the patient. The patient was provided an opportunity to ask questions and all were answered. The patient agreed with the  plan and demonstrated an understanding of the instructions.   The patient was advised to call back or seek an in-person evaluation if the symptoms worsen or if the condition fails to improve as anticipated.  I provided 25 minutes of non-face-to-face time during this encounter.   Syliva OvermanMargaret Barbarita Hutmacher, MD

## 2019-02-11 NOTE — Patient Instructions (Addendum)
Physical exam June 22 in office with MD and shingrix # 1  Mammogram is being scheduled and appt mailed  Your  Blood pressure is elevated. New additional medication is clonidine 0.1 mg one at bedtime  New for allergies for as needed use is loratidine one daily  Congrats on weight loss, keep it up  Please get fasting lipid, cmp and eGFR, and HBA1C 1 week before next visit  It is important that you exercise regularly at least 30 minutes 5 times a week. If you develop chest pain, have severe difficulty breathing, or feel very tired, stop exercising immediately and seek medical attention    Think about what you will eat, plan ahead. Choose " clean, green, fresh or frozen" over canned, processed or packaged foods which are more sugary, salty and fatty. 70 to 75% of food eaten should be vegetables and fruit. Three meals at set times with snacks allowed between meals, but they must be fruit or vegetables. Aim to eat over a 12 hour period , example 7 am to 7 pm, and STOP after  your last meal of the day. Drink water,generally about 64 ounces per day, no other drink is as healthy. Fruit juice is best enjoyed in a healthy way, by EATING the fruit.   Thanks for choosing [facility], we consider it a privelige to serve you.

## 2019-02-11 NOTE — Assessment & Plan Note (Addendum)
Brittany Maxwell is reminded of the importance of commitment to daily physical activity for 30 minutes or more, as able and the need to limit carbohydrate intake to 30 to 60 grams per meal to help with blood sugar control.   The need to take medication as prescribed, test blood sugar as directed, and to call between visits if there is a concern that blood sugar is uncontrolled is also discussed.   Brittany Maxwell is reminded of the importance of daily foot exam, annual eye examination, and good blood sugar, blood pressure and cholesterol control. Updated lab needed  Diabetic Labs Latest Ref Rng & Units 09/12/2018 09/10/2018 07/05/2017 12/21/2016 11/16/2016  HbA1c <5.7 % of total Hgb - 6.2(H) 6.2(H) - 5.9(H)  Microalbumin Not Estab. ug/mL 45.2(H) - - 72.5(H) -  Micro/Creat Ratio 0.0 - 30.0 mg/g creat 26.5 - - 21.3 -  Chol <200 mg/dL - 917(H) 150 - 569  HDL >50 mg/dL - 51 56 - 79(Y)  Calc LDL mg/dL (calc) - 801(K) 98 - 84  Triglycerides <150 mg/dL - 553 748(O) - 707  Creatinine 0.50 - 1.05 mg/dL - 8.67(J) 4.49(E) - 0.10(O)   BP/Weight 02/11/2019 09/12/2018 03/20/2018 12/30/2017 11/08/2017 07/05/2017 06/14/2017  Systolic BP 156 140 122 132 108 130 132  Diastolic BP 108 96 78 86 80 88 88  Wt. (Lbs) 198.6 205 203.08 198 198 218.75 204  BMI 35.18 36.31 35.97 33.99 35.07 38.75 36.14   Foot/eye exam completion dates Latest Ref Rng & Units 03/20/2018 02/21/2017  Eye Exam No Retinopathy - No Retinopathy  Foot Form Completion - Done -

## 2019-02-16 ENCOUNTER — Encounter: Payer: Self-pay | Admitting: Family Medicine

## 2019-02-16 DIAGNOSIS — Z7189 Other specified counseling: Secondary | ICD-10-CM | POA: Insufficient documentation

## 2019-02-16 NOTE — Assessment & Plan Note (Signed)
Covid-19 Education  The signs and symptoms of of COVID -19 were discussed with the patient and how to seek care for testing. ( follow up with PCP or arrange  E-visit) The importance of social  distancing is discussed today.  

## 2019-02-16 NOTE — Assessment & Plan Note (Signed)
Hyperlipidemia:Low fat diet discussed and encouraged.   Lipid Panel  Lab Results  Component Value Date   CHOL 212 (H) 09/10/2018   HDL 51 09/10/2018   LDLCALC 139 (H) 09/10/2018   TRIG 108 09/10/2018   CHOLHDL 4.2 09/10/2018   Uncontrolled when last checked Updated lab needed

## 2019-02-16 NOTE — Assessment & Plan Note (Signed)
Managed by pain management and controlled

## 2019-02-16 NOTE — Assessment & Plan Note (Signed)
Obesity linked with hypertension and diabetes nd chronic back pain, improved, she is applauded on this Patient re-educated about  the importance of commitment to a  minimum of 150 minutes of exercise per week as able.  The importance of healthy food choices with portion control discussed, as well as eating regularly and within a 12 hour window most days. The need to choose "clean , green" food 50 to 75% of the time is discussed, as well as to make water the primary drink and set a goal of 64 ounces water daily.     Weight /BMI 02/11/2019 09/12/2018 03/20/2018  WEIGHT 198 lb 9.6 oz 205 lb 203 lb 1.3 oz  HEIGHT 5\' 3"  5\' 3"  5\' 3"   BMI 35.18 kg/m2 36.31 kg/m2 35.97 kg/m2

## 2019-02-16 NOTE — Assessment & Plan Note (Signed)
Increased and uncontrolled with season change,  Will start daily medication

## 2019-02-21 DIAGNOSIS — M5416 Radiculopathy, lumbar region: Secondary | ICD-10-CM | POA: Diagnosis not present

## 2019-02-21 DIAGNOSIS — M545 Low back pain: Secondary | ICD-10-CM | POA: Diagnosis not present

## 2019-03-12 ENCOUNTER — Telehealth: Payer: Self-pay | Admitting: *Deleted

## 2019-03-12 NOTE — Telephone Encounter (Signed)
We haven't heard anything about a recall but if her medication has a recall on it the pharmacy will notify her if her medication is affected

## 2019-03-12 NOTE — Telephone Encounter (Signed)
Called pt to let her know and she understood said the pharmacy had contacted her and told her it was ok also

## 2019-03-12 NOTE — Telephone Encounter (Signed)
Pt called said she had received her refill on metformin but also got a notification about a recall on the medication and wanted to know if she should keep taking the medication.

## 2019-03-24 ENCOUNTER — Encounter: Payer: BLUE CROSS/BLUE SHIELD | Admitting: Family Medicine

## 2019-03-25 DIAGNOSIS — I10 Essential (primary) hypertension: Secondary | ICD-10-CM | POA: Diagnosis not present

## 2019-03-25 DIAGNOSIS — E1169 Type 2 diabetes mellitus with other specified complication: Secondary | ICD-10-CM | POA: Diagnosis not present

## 2019-03-25 DIAGNOSIS — E7800 Pure hypercholesterolemia, unspecified: Secondary | ICD-10-CM | POA: Diagnosis not present

## 2019-03-26 ENCOUNTER — Encounter: Payer: Self-pay | Admitting: Family Medicine

## 2019-03-26 ENCOUNTER — Other Ambulatory Visit: Payer: Self-pay

## 2019-03-26 ENCOUNTER — Ambulatory Visit (INDEPENDENT_AMBULATORY_CARE_PROVIDER_SITE_OTHER): Payer: BC Managed Care – PPO | Admitting: Family Medicine

## 2019-03-26 VITALS — BP 118/84 | HR 74 | Temp 98.4°F | Resp 14 | Ht 63.0 in | Wt 199.0 lb

## 2019-03-26 DIAGNOSIS — I10 Essential (primary) hypertension: Secondary | ICD-10-CM

## 2019-03-26 DIAGNOSIS — Z23 Encounter for immunization: Secondary | ICD-10-CM | POA: Diagnosis not present

## 2019-03-26 DIAGNOSIS — Z0000 Encounter for general adult medical examination without abnormal findings: Secondary | ICD-10-CM | POA: Diagnosis not present

## 2019-03-26 LAB — LIPID PANEL
Cholesterol: 165 mg/dL (ref <200)
HDL: 62 mg/dL (ref >=50)
LDL Cholesterol (Calc): 85 mg/dL (calc)
Non-HDL Cholesterol (Calc): 103 mg/dL (calc) (ref <130)
Total CHOL/HDL Ratio: 2.7 (calc) (ref <5.0)
Triglycerides: 87 mg/dL (ref <150)

## 2019-03-26 LAB — COMPLETE METABOLIC PANEL WITH GFR
AG Ratio: 1.3 (calc) (ref 1.0–2.5)
ALT: 17 U/L (ref 6–29)
AST: 18 U/L (ref 10–35)
Albumin: 4.1 g/dL (ref 3.6–5.1)
Alkaline phosphatase (APISO): 71 U/L (ref 37–153)
BUN/Creatinine Ratio: 15 (calc) (ref 6–22)
BUN: 16 mg/dL (ref 7–25)
CO2: 31 mmol/L (ref 20–32)
Calcium: 9.8 mg/dL (ref 8.6–10.4)
Chloride: 102 mmol/L (ref 98–110)
Creat: 1.09 mg/dL — ABNORMAL HIGH (ref 0.50–1.05)
GFR, Est African American: 67 mL/min/{1.73_m2} (ref >=60)
GFR, Est Non African American: 58 mL/min/{1.73_m2} — ABNORMAL LOW (ref >=60)
Globulin: 3.2 g/dL (calc) (ref 1.9–3.7)
Glucose, Bld: 103 mg/dL — ABNORMAL HIGH (ref 65–99)
Potassium: 3.7 mmol/L (ref 3.5–5.3)
Sodium: 142 mmol/L (ref 135–146)
Total Bilirubin: 0.5 mg/dL (ref 0.2–1.2)
Total Protein: 7.3 g/dL (ref 6.1–8.1)

## 2019-03-26 LAB — HEMOGLOBIN A1C
Hgb A1c MFr Bld: 6.2 % of total Hgb — ABNORMAL HIGH (ref <5.7)
Mean Plasma Glucose: 131 (calc)
eAG (mmol/L): 7.3 (calc)

## 2019-03-26 MED ORDER — PHENTERMINE HCL 37.5 MG PO TABS: 37.5000 mg | ORAL_TABLET | Freq: Every day | ORAL | 1 refills | Status: DC

## 2019-03-26 NOTE — Assessment & Plan Note (Signed)

## 2019-03-26 NOTE — Patient Instructions (Signed)
F/U in 4 months with MD, call if you need me sooner, Flu vaccine at visit  Nurse visit in 10 weeks for shingrix # 2  Mammogram to be scheduled please   BP is excellent as are your labs  Resume half phentermine daily.  Keep active  Weight loss goal of 2.5 to 3 pounds per months  Social distancing. Frequent hand washing with soap and water Keeping your hands off of your face. These 3 practices will help to keep both you and your community healthy during this time. Please practice them faithfully!

## 2019-03-26 NOTE — Assessment & Plan Note (Signed)
  Patient re-educated about  the importance of commitment to a  minimum of 150 minutes of exercise per week as able. Resume half phentermine daily  The importance of healthy food choices with portion control discussed, as well as eating regularly and within a 12 hour window most days. The need to choose "clean , green" food 50 to 75% of the time is discussed, as well as to make water the primary drink and set a goal of 64 ounces water daily.    Weight /BMI 03/26/2019 02/11/2019 09/12/2018  WEIGHT 199 lb 198 lb 9.6 oz 205 lb  HEIGHT 5\' 3"  5\' 3"  5\' 3"   BMI 35.25 kg/m2 35.18 kg/m2 36.31 kg/m2

## 2019-03-26 NOTE — Assessment & Plan Note (Signed)
After obtaining informed consent, the vaccine is  administered , with no adverse effect noted at the time of administration.  

## 2019-03-26 NOTE — Assessment & Plan Note (Signed)
Controlled, no change in medication DASH diet and commitment to daily physical activity for a minimum of 30 minutes discussed and encouraged, as a part of hypertension management. The importance of attaining a healthy weight is also discussed.  BP/Weight 03/26/2019 02/11/2019 09/12/2018 03/20/2018 12/30/2017 11/08/2017 63/12/3543  Systolic BP 625 638 937 342 876 811 572  Diastolic BP 84 620 96 78 86 80 88  Wt. (Lbs) 199 198.6 205 203.08 198 198 218.75  BMI 35.25 35.18 36.31 35.97 33.99 35.07 38.75

## 2019-03-31 ENCOUNTER — Encounter: Payer: Self-pay | Admitting: Family Medicine

## 2019-03-31 NOTE — Progress Notes (Signed)
Brittany Maxwell     MRN: [id number]      DOB: 10-23-1965  HPI: Patient is in for annual physical exam. C/o obesity, wants hypertension re evaluated on current medication and to resume phentermine Recent labs, if available are reviewed. Immunization is reviewed , and  updated   PE: BP 118/84   Pulse 74   Temp 98.4 F (36.9 C) (Temporal)   Resp 14   Ht 5\' 3"  (1.6 m)   Wt 199 lb (90.3 kg)   LMP  (Within Weeks)   SpO2 96%   BMI 35.25 kg/m   Pleasant  female, alert and oriented x 3, in no cardio-pulmonary distress. Afebrile. HEENT No facial trauma or asymetry. Sinuses non tender.  Extra occullar muscles intact, pupils equally reactive to light. External ears normal, tympanic membranes clear. Oropharynx moist, no exudate. Neck: supple, no adenopathy,JVD or thyromegaly.No bruits.  Chest: Clear to ascultation bilaterally.No crackles or wheezes. Non tender to palpation  Breast: No asymetry,no masses or lumps. No tenderness. No nipple discharge or inversion. No axillary or supraclavicular adenopathy  Cardiovascular system; Heart sounds normal,  S1 and  S2 ,no S3.  No murmur, or thrill. Apical beat not displaced Peripheral pulses normal.  Abdomen: Soft, non tender, no organomegaly or masses. No bruits. Bowel sounds normal. No guarding, tenderness or rebound.   GU: External genitalia normal female genitalia , normal female distribution of hair. No lesions. Urethral meatus normal in size, no  Prolapse, no lesions visibly  Present. Bladder non tender. Vagina pink and moist , with no visible lesions , discharge present . Adequate pelvic support no  cystocele or rectocele noted Cervix pink and appears healthy, no lesions or ulcerations noted, no discharge noted from os Uterus normal size, no adnexal masses, no cervical motion or adnexal tenderness.   Musculoskeletal exam: DEcreased  ROM of spine, adequate in hips , shoulders and knees. No deformity ,swelling or  crepitus noted. No muscle wasting or atrophy.   Neurologic: Cranial nerves 2 to 12 intact. Power, tone ,sensation and reflexes normal throughout. No disturbance in gait. No tremor.  Skin: Intact, no ulceration, erythema , scaling or rash noted. Pigmentation normal throughout  Psych; Normal mood and affect. Judgement and concentration normal   Assessment & Plan:  Annual physical exam Annual exam as documented. Counseling done  re healthy lifestyle involving commitment to 150 minutes exercise per week, heart healthy diet, and attaining healthy weight.The importance of adequate sleep also discussed. Regular seat belt use and home safety, is also discussed. Changes in health habits are decided on by the patient with goals and time frames  set for achieving them. Immunization and cancer screening needs are specifically addressed at this visit. '  Need for shingles vaccine After obtaining informed consent, the vaccine is  administered , with no adverse effect noted at the time of administration.   Essential hypertension, benign Controlled, no change in medication DASH diet and commitment to daily physical activity for a minimum of 30 minutes discussed and encouraged, as a part of hypertension management. The importance of attaining a healthy weight is also discussed.  BP/Weight 03/26/2019 02/11/2019 09/12/2018 03/20/2018 12/30/2017 11/08/2017 14/06/7025  Systolic BP 378 588 502 774 128 786 767  Diastolic BP 84 209 96 78 86 80 88  Wt. (Lbs) 199 198.6 205 203.08 198 198 218.75  BMI 35.25 35.18 36.31 35.97 33.99 35.07 38.75       Morbid obesity (HCC)  Patient re-educated about  the importance of  commitment to a  minimum of 150 minutes of exercise per week as able. Resume half phentermine daily  The importance of healthy food choices with portion control discussed, as well as eating regularly and within a 12 hour window most days. The need to choose "clean , green" food 50 to 75% of  the time is discussed, as well as to make water the primary drink and set a goal of 64 ounces water daily.    Weight /BMI 03/26/2019 02/11/2019 09/12/2018  WEIGHT 199 lb 198 lb 9.6 oz 205 lb  HEIGHT 5\' 3"  5\' 3"  5\' 3"   BMI 35.25 kg/m2 35.18 kg/m2 36.31 kg/m2

## 2019-04-07 ENCOUNTER — Ambulatory Visit (HOSPITAL_COMMUNITY): Payer: BC Managed Care – PPO

## 2019-04-09 ENCOUNTER — Other Ambulatory Visit: Payer: Self-pay

## 2019-04-09 ENCOUNTER — Ambulatory Visit (HOSPITAL_COMMUNITY)
Admission: RE | Admit: 2019-04-09 | Discharge: 2019-04-09 | Disposition: A | Discharge status: Home / Self Care | Payer: BC Managed Care – PPO | Source: Ambulatory Visit | Attending: Family Medicine | Admitting: Family Medicine

## 2019-04-09 DIAGNOSIS — Z1231 Encounter for screening mammogram for malignant neoplasm of breast: Secondary | ICD-10-CM | POA: Insufficient documentation

## 2019-04-09 DIAGNOSIS — Z1239 Encounter for other screening for malignant neoplasm of breast: Secondary | ICD-10-CM

## 2019-04-30 DIAGNOSIS — M5416 Radiculopathy, lumbar region: Secondary | ICD-10-CM | POA: Diagnosis not present

## 2019-04-30 DIAGNOSIS — M545 Low back pain: Secondary | ICD-10-CM | POA: Diagnosis not present

## 2019-04-30 DIAGNOSIS — I10 Essential (primary) hypertension: Secondary | ICD-10-CM | POA: Diagnosis not present

## 2019-04-30 DIAGNOSIS — E119 Type 2 diabetes mellitus without complications: Secondary | ICD-10-CM | POA: Diagnosis not present

## 2019-06-04 ENCOUNTER — Other Ambulatory Visit: Payer: Self-pay

## 2019-06-04 ENCOUNTER — Ambulatory Visit (INDEPENDENT_AMBULATORY_CARE_PROVIDER_SITE_OTHER): Payer: BC Managed Care – PPO

## 2019-06-04 VITALS — BP 140/89 | HR 88 | Temp 98.3°F | Resp 12 | Ht 64.0 in | Wt 195.0 lb

## 2019-06-04 DIAGNOSIS — Z23 Encounter for immunization: Secondary | ICD-10-CM

## 2019-06-11 ENCOUNTER — Other Ambulatory Visit: Payer: Self-pay | Admitting: Family Medicine

## 2019-07-29 ENCOUNTER — Other Ambulatory Visit: Payer: Self-pay

## 2019-07-29 ENCOUNTER — Ambulatory Visit: Payer: BC Managed Care – PPO | Admitting: Family Medicine

## 2019-07-29 ENCOUNTER — Encounter: Payer: Self-pay | Admitting: Family Medicine

## 2019-07-29 ENCOUNTER — Telehealth: Payer: Self-pay | Admitting: *Deleted

## 2019-07-29 VITALS — BP 118/80 | HR 72 | Temp 97.8°F | Resp 15 | Ht 64.0 in | Wt 199.0 lb

## 2019-07-29 DIAGNOSIS — Z23 Encounter for immunization: Secondary | ICD-10-CM

## 2019-07-29 DIAGNOSIS — E669 Obesity, unspecified: Secondary | ICD-10-CM

## 2019-07-29 DIAGNOSIS — I10 Essential (primary) hypertension: Secondary | ICD-10-CM | POA: Diagnosis not present

## 2019-07-29 DIAGNOSIS — N83202 Unspecified ovarian cyst, left side: Secondary | ICD-10-CM

## 2019-07-29 DIAGNOSIS — D539 Nutritional anemia, unspecified: Secondary | ICD-10-CM

## 2019-07-29 DIAGNOSIS — E1159 Type 2 diabetes mellitus with other circulatory complications: Secondary | ICD-10-CM

## 2019-07-29 DIAGNOSIS — E8881 Metabolic syndrome: Secondary | ICD-10-CM

## 2019-07-29 DIAGNOSIS — N83201 Unspecified ovarian cyst, right side: Secondary | ICD-10-CM | POA: Insufficient documentation

## 2019-07-29 DIAGNOSIS — E559 Vitamin D deficiency, unspecified: Secondary | ICD-10-CM | POA: Diagnosis not present

## 2019-07-29 DIAGNOSIS — N926 Irregular menstruation, unspecified: Secondary | ICD-10-CM

## 2019-07-29 DIAGNOSIS — E7800 Pure hypercholesterolemia, unspecified: Secondary | ICD-10-CM

## 2019-07-29 MED ORDER — PHENTERMINE HCL 37.5 MG PO TABS: 37.5000 mg | ORAL_TABLET | Freq: Every day | ORAL | 3 refills | Status: DC

## 2019-07-29 NOTE — Assessment & Plan Note (Signed)
Updated lab needed at/ before next visit.   

## 2019-07-29 NOTE — Assessment & Plan Note (Signed)
9 month h/o irregular cycles in terms of amount of blood loss and frequency of bleeding. pst h/o ovarian cysts on imaging, obtain SH and lH and refer for pelvic US

## 2019-07-29 NOTE — Patient Instructions (Addendum)
F/U 2nd week in Feb , call if you need me before  Pls give pt info to sign up on my chart / help her at checkout  Pneumonia 23 and flu vaccine today  Please get fasting CBC, lipid, cmp and EGFr, hBA1C,.TSH and vit D, FSH and LH, and microalb Decmber 13,or after  You are referred for [place] of pelvic US due to irregular bleeding and ovarian cysts  Pls schedule your eye exam  Think about what you will eat, plan ahead. Choose " clean, green, fresh or frozen" over canned, processed or packaged foods which are more sugary, salty and fatty. 70 to 75% of food eaten should be vegetables and fruit. Three meals at set times with snacks allowed between meals, but they must be fruit or vegetables. Aim to eat over a 12 hour period , example 7 am to 7 pm, and STOP after  your last meal of the day. Drink water,generally about 64 ounces per day, no other drink is as healthy. Fruit juice is best enjoyed in a healthy way, by EATING the fruit. It is important that you exercise regularly at least 30 minutes 5 times a week. If you develop chest pain, have severe difficulty breathing, or feel very tired, stop exercising immediately and seek medical attention  Thanks for choosing [facility], we consider it a privelige to serve you.

## 2019-07-29 NOTE — Telephone Encounter (Signed)
[employer] disability forms received by pt via walk in  Copied noted sleeved

## 2019-07-29 NOTE — Assessment & Plan Note (Signed)
  Patient re-educated about  the importance of commitment to a  minimum of 150 minutes of exercise per week as able.  The importance of healthy food choices with portion control discussed, as well as eating regularly and within a 12 hour window most days. The need to choose "clean , green" food 50 to 75% of the time is discussed, as well as to make water the primary drink and set a goal of 64 ounces water daily.    Weight /BMI 07/29/2019 06/04/2019 03/26/2019  WEIGHT 199 lb 195 lb 0.6 oz 199 lb  HEIGHT 5\' 4"  5\' 4"  5\' 3"   BMI 34.16 kg/m2 33.48 kg/m2 35.25 kg/m2

## 2019-07-29 NOTE — Assessment & Plan Note (Addendum)
Controlled, no change in medication Updated lab needed at/ before next visit. [name] is reminded of the importance of commitment to daily physical activity for 30 minutes or more, as able and the need to limit carbohydrate intake to 30 to 60 grams per meal to help with blood sugar control.   [name] is reminded of the importance of daily foot exam, annual eye examination, and good blood sugar, blood pressure and cholesterol control.  Diabetic Labs Latest Ref Rng & Units 03/25/2019 09/12/2018 09/10/2018 07/05/2017 12/21/2016  HbA1c <5.7 % of total Hgb 6.2(H) - 6.2(H) 6.2(H) -  Microalbumin Not Estab. ug/mL - 45.2(H) - - 72.5(H)  Micro/Creat Ratio 0.0 - 30.0 mg/g creat - 26.5 - - 21.3  Chol <200 mg/dL 165 - 212(H) 185 -  HDL > OR = 50 mg/dL 62 - 51 56 -  Calc LDL mg/dL (calc) 85 - 139(H) 98 -  Triglycerides <150 mg/dL 87 - 108 215(H) -  Creatinine 0.50 - 1.05 mg/dL 1.09(H) - 1.15(H) 1.09(H) -   BP/Weight 07/29/2019 06/04/2019 03/26/2019 02/11/2019 09/12/2018 03/20/2018 04/26/2034  Systolic BP 597 416 384 536 468 032 122  Diastolic BP 80 89 84 482 96 78 86  Wt. (Lbs) 199 195.04 199 198.6 205 203.08 198  BMI 34.16 33.48 35.25 35.18 36.31 35.97 33.99   Foot/eye exam completion dates Latest Ref Rng & Units 03/20/2018 02/21/2017  Eye Exam No Retinopathy - No Retinopathy  Foot Form Completion - Done -

## 2019-07-29 NOTE — Progress Notes (Signed)
Brittany Maxwell     MRN: [id number]      DOB: Jun 20, 1966   HPI Brittany Maxwell is here for follow up and re-evaluation of chronic medical conditions, medication management and review of any available recent lab and radiology data.  Preventive health is updated, specifically  Cancer screening and Immunization.   Questions or concerns regarding consultations or procedures which the PT has had in the interim are  addressed. The PT denies any adverse reactions to current medications since the last visit.  There are no new concerns.  C/o lack of weight loss, but intends to stick with phentermine which she states is  Definitely beneficial in weight loss C/o irregular , and infrequent leeding this year, sometimes lihght , sometimes hevy ROS Denies recent fever or chills. Denies sinus pressure, nasal congestion, ear pain or sore throat. Denies chest congestion, productive cough or wheezing. Denies chest pains, palpitations and leg swelling Denies abdominal pain, nausea, vomiting,diarrhea or constipation.   Denies dysuria, frequency, hesitancy or incontinence. Denies uncontrolled  joint pain, swelling and limitation in mobility. Denies headaches, seizures, numbness, or tingling. Denies depression, anxiety or insomnia. Denies skin break down or rash.   PE  BP 118/80   Pulse 72   Temp 97.8 F (36.6 C) (Temporal)   Resp 15   Ht 5\' 4"  (1.626 m)   Wt 199 lb (90.3 kg)   SpO2 97%   BMI 34.16 kg/m   Patient alert and oriented and in no cardiopulmonary distress.  HEENT: No facial asymmetry, EOMI,     Neck supple .  Chest: Clear to auscultation bilaterally.  CVS: S1, S2 no murmurs, no S3.Regular rate.  ABD: Soft non tender.   Ext: No edema  MS: Adequate ROM spine, shoulders, hips and knees.  Skin: Intact, no ulcerations or rash noted.  Psych: Good eye contact, normal affect. Memory intact not anxious or depressed appearing.  CNS: CN 2-12 intact, power,  normal throughout.no focal  deficits noted.   Assessment & Plan  Diabetes mellitus type 2 in obese (HCC) Controlled, no change in medication Updated lab needed at/ before next visit. [name] is reminded of the importance of commitment to daily physical activity for 30 minutes or more, as able and the need to limit carbohydrate intake to 30 to 60 grams per meal to help with blood sugar control.   [name] is reminded of the importance of daily foot exam, annual eye examination, and good blood sugar, blood pressure and cholesterol control.  Diabetic Labs Latest Ref Rng & Units 03/25/2019 09/12/2018 09/10/2018 07/05/2017 12/21/2016  HbA1c <5.7 % of total Hgb 6.2(H) - 6.2(H) 6.2(H) -  Microalbumin Not Estab. ug/mL - 45.2(H) - - 72.5(H)  Micro/Creat Ratio 0.0 - 30.0 mg/g creat - 26.5 - - 21.3  Chol <200 mg/dL 12/23/2016 - 017) 494(W -  HDL > OR = 50 mg/dL 62 - 51 56 -  Calc LDL mg/dL (calc) 85 - 967) 98 -  Triglycerides <150 mg/dL 87 - 591(M 384) -  Creatinine 0.50 - 1.05 mg/dL 665(L) - 9.35(T) 0.17(B) -   BP/Weight 07/29/2019 06/04/2019 03/26/2019 02/11/2019 09/12/2018 03/20/2018 12/30/2017  Systolic BP 118 140 118 156 140 122 132  Diastolic BP 80 89 84 108 96 78 86  Wt. (Lbs) 199 195.04 199 198.6 205 203.08 198  BMI 34.16 33.48 35.25 35.18 36.31 35.97 33.99   Foot/eye exam completion dates Latest Ref Rng & Units 03/20/2018 02/21/2017  Eye Exam No Retinopathy - No Retinopathy  Foot Form Completion - Done -        Essential hypertension, benign Controlled, no change in medication DASH diet and commitment to daily physical activity for a minimum of 30 minutes discussed and encouraged, as a part of hypertension management. The importance of attaining a healthy weight is also discussed.  BP/Weight 07/29/2019 06/04/2019 03/26/2019 02/11/2019 09/12/2018 03/20/2018 0/24/0973  Systolic BP 532 992 426 834 196 222 979  Diastolic BP 80 89 84 892 96 78 86  Wt. (Lbs) 199 195.04 199 198.6 205 203.08 198  BMI 34.16 33.48  35.25 35.18 36.31 35.97 33.99       Irregular menses 9 month h/o irregular cycles in terms of amount of blood loss and frequency of bleeding. pst h/o ovarian cysts on imaging, obtain SH and lH and refer for pelvic US  Obesity (BMI 30.0-34.9)  Patient re-educated about  the importance of commitment to a  minimum of 150 minutes of exercise per week as able.  The importance of healthy food choices with portion control discussed, as well as eating regularly and within a 12 hour window most days. The need to choose "clean , green" food 50 to 75% of the time is discussed, as well as to make water the primary drink and set a goal of 64 ounces water daily.    Weight /BMI 07/29/2019 06/04/2019 03/26/2019  WEIGHT 199 lb 195 lb 0.6 oz 199 lb  HEIGHT 5\' 4"  5\' 4"  5\' 3"   BMI 34.16 kg/m2 33.48 kg/m2 35.25 kg/m2      Hypercholesteremia Hyperlipidemia:Low fat diet discussed and encouraged.   Lipid Panel  Lab Results  Component Value Date   CHOL 165 03/25/2019   HDL 62 03/25/2019   LDLCALC 85 03/25/2019   TRIG 87 03/25/2019   CHOLHDL 2.7 03/25/2019     Updated lab needed at/ before next visit.   Vitamin D insufficiency Updated lab needed at/ before next visit.

## 2019-07-29 NOTE — Assessment & Plan Note (Signed)
Controlled, no change in medication DASH diet and commitment to daily physical activity for a minimum of 30 minutes discussed and encouraged, as a part of hypertension management. The importance of attaining a healthy weight is also discussed.  BP/Weight 07/29/2019 06/04/2019 03/26/2019 02/11/2019 09/12/2018 03/20/2018 0/31/5945  Systolic BP 859 292 446 286 381 771 165  Diastolic BP 80 89 84 790 96 78 86  Wt. (Lbs) 199 195.04 199 198.6 205 203.08 198  BMI 34.16 33.48 35.25 35.18 36.31 35.97 33.99

## 2019-07-29 NOTE — Assessment & Plan Note (Signed)
Hyperlipidemia:Low fat diet discussed and encouraged.   Lipid Panel  Lab Results  Component Value Date   CHOL 165 03/25/2019   HDL 62 03/25/2019   LDLCALC 85 03/25/2019   TRIG 87 03/25/2019   CHOLHDL 2.7 03/25/2019     Updated lab needed at/ before next visit.

## 2019-07-31 DIAGNOSIS — I10 Essential (primary) hypertension: Secondary | ICD-10-CM

## 2019-08-04 ENCOUNTER — Encounter: Payer: Self-pay | Admitting: Family Medicine

## 2019-08-04 ENCOUNTER — Other Ambulatory Visit: Payer: Self-pay

## 2019-08-04 ENCOUNTER — Ambulatory Visit (INDEPENDENT_AMBULATORY_CARE_PROVIDER_SITE_OTHER): Payer: BC Managed Care – PPO | Admitting: Family Medicine

## 2019-08-04 VITALS — BP 118/80 | Temp 98.2°F | Resp 15 | Ht 64.0 in | Wt 195.2 lb

## 2019-08-04 DIAGNOSIS — Z0000 Encounter for general adult medical examination without abnormal findings: Secondary | ICD-10-CM | POA: Diagnosis not present

## 2019-08-04 NOTE — Patient Instructions (Signed)
Brittany Maxwell , Thank you for taking time to come for your Medicare Wellness Visit. I appreciate your ongoing commitment to your health goals. Please review the following plan we discussed and let me know if I can assist you in the future.   Please continue to practice social distancing to keep you, your family, and our community safe.  If you must go out, please wear a Mask and practice good handwashing.  We hope that you have a happy, safe, wonderful and healthy holiday season please let us know if you need anything.  Screening recommendations/referrals: Colonoscopy: Due 2028 Mammogram: Up-to-date Bone Density: Due age 53 Recommended yearly ophthalmology/optometry visit for glaucoma screening and checkup Recommended yearly dental visit for hygiene and checkup  Vaccinations: Influenza vaccine: Completed, due again 2021 Pneumococcal vaccine: Up-to-date  Tdap vaccine: Up-to-date Shingles vaccine: Completed  Advanced directives: Information to be mailed  Conditions/risks identified: Falls  Next appointment: 11/11/2019   Preventive Care 40-64 Years, Female Preventive care refers to lifestyle choices and visits with your health care provider that can promote health and wellness. What does preventive care include?  A yearly physical exam. This is also called an annual well check.  Dental exams once or twice a year.  Routine eye exams. Ask your health care provider how often you should have your eyes checked.  Personal lifestyle choices, including:  Daily care of your teeth and gums.  Regular physical activity.  Eating a healthy diet.  Avoiding tobacco and drug use.  Limiting alcohol use.  Practicing safe sex.  Taking low-dose aspirin daily starting at age 27.  Taking vitamin and mineral supplements as recommended by your health care provider. What happens during an annual well check? The services and screenings done by your health care provider during your annual well  check will depend on your age, overall health, lifestyle risk factors, and family history of disease. Counseling  Your health care provider may ask you questions about your:  Alcohol use.  Tobacco use.  Drug use.  Emotional well-being.  Home and relationship well-being.  Sexual activity.  Eating habits.  Work and work Statistician.  Method of birth control.  Menstrual cycle.  Pregnancy history. Screening  You may have the following tests or measurements:  Height, weight, and BMI.  Blood pressure.  Lipid and cholesterol levels. These may be checked every 5 years, or more frequently if you are over 38 years old.  Skin check.  Lung cancer screening. You may have this screening every year starting at age 54 if you have a 30-pack-year history of smoking and currently smoke or have quit within the past 15 years.  Fecal occult blood test (FOBT) of the stool. You may have this test every year starting at age 102.  Flexible sigmoidoscopy or colonoscopy. You may have a sigmoidoscopy every 5 years or a colonoscopy every 10 years starting at age 39.  Hepatitis C blood test.  Hepatitis B blood test.  Sexually transmitted disease (STD) testing.  Diabetes screening. This is done by checking your blood sugar (glucose) after you have not eaten for a while (fasting). You may have this done every 1-3 years.  Mammogram. This may be done every 1-2 years. Talk to your health care provider about when you should start having regular mammograms. This may depend on whether you have a family history of breast cancer.  BRCA-related cancer screening. This may be done if you have a family history of breast, ovarian, tubal, or peritoneal cancers.  Pelvic exam  and Pap test. This may be done every 3 years starting at age 32. Starting at age 64, this may be done every 5 years if you have a Pap test in combination with an HPV test.  Bone density scan. This is done to screen for osteoporosis. You  may have this scan if you are at high risk for osteoporosis. Discuss your test results, treatment options, and if necessary, the need for more tests with your health care provider. Vaccines  Your health care provider may recommend certain vaccines, such as:  Influenza vaccine. This is recommended every year.  Tetanus, diphtheria, and acellular pertussis (Tdap, Td) vaccine. You may need a Td booster every 10 years.  Zoster vaccine. You may need this after age 73.  Pneumococcal 13-valent conjugate (PCV13) vaccine. You may need this if you have certain conditions and were not previously vaccinated.  Pneumococcal polysaccharide (PPSV23) vaccine. You may need one or two doses if you smoke cigarettes or if you have certain conditions. Talk to your health care provider about which screenings and vaccines you need and how often you need them. This information is not intended to replace advice given to you by your health care provider. Make sure you discuss any questions you have with your health care provider. Document Released: 10/15/2015 Document Revised: 06/07/2016 Document Reviewed: 07/20/2015 Elsevier Interactive Patient Education  2017 [place] Prevention in the Home Falls can cause injuries. They can happen to people of all ages. There are many things you can do to make your home safe and to help prevent falls. What can I do on the outside of my home?  Regularly fix the edges of walkways and driveways and fix any cracks.  Remove anything that might make you trip as you walk through a door, such as a raised step or threshold.  Trim any bushes or trees on the path to your home.  Use bright outdoor lighting.  Clear any walking paths of anything that might make someone trip, such as rocks or tools.  Regularly check to see if handrails are loose or broken. Make sure that both sides of any steps have handrails.  Any raised decks and porches should have guardrails on the  edges.  Have any leaves, snow, or ice cleared regularly.  Use sand or salt on walking paths during winter.  Clean up any spills in your garage right away. This includes oil or grease spills. What can I do in the bathroom?  Use night lights.  Install grab bars by the toilet and in the tub and shower. Do not use towel bars as grab bars.  Use non-skid mats or decals in the tub or shower.  If you need to sit down in the shower, use a plastic, non-slip stool.  Keep the floor dry. Clean up any water that spills on the floor as soon as it happens.  Remove soap buildup in the tub or shower regularly.  Attach bath mats securely with double-sided non-slip rug tape.  Do not have throw rugs and other things on the floor that can make you trip. What can I do in the bedroom?  Use night lights.  Make sure that you have a light by your bed that is easy to reach.  Do not use any sheets or blankets that are too big for your bed. They should not hang down onto the floor.  Have a firm chair that has side arms. You can use this for support while  you get dressed.  Do not have throw rugs and other things on the floor that can make you trip. What can I do in the kitchen?  Clean up any spills right away.  Avoid walking on wet floors.  Keep items that you use a lot in easy-to-reach places.  If you need to reach something above you, use a strong step stool that has a grab bar.  Keep electrical cords out of the way.  Do not use floor polish or wax that makes floors slippery. If you must use wax, use non-skid floor wax.  Do not have throw rugs and other things on the floor that can make you trip. What can I do with my stairs?  Do not leave any items on the stairs.  Make sure that there are handrails on both sides of the stairs and use them. Fix handrails that are broken or loose. Make sure that handrails are as long as the stairways.  Check any carpeting to make sure that it is firmly  attached to the stairs. Fix any carpet that is loose or worn.  Avoid having throw rugs at the top or bottom of the stairs. If you do have throw rugs, attach them to the floor with carpet tape.  Make sure that you have a light switch at the top of the stairs and the bottom of the stairs. If you do not have them, ask someone to add them for you. What else can I do to help prevent falls?  Wear shoes that:  Do not have high heels.  Have rubber bottoms.  Are comfortable and fit you well.  Are closed at the toe. Do not wear sandals.  If you use a stepladder:  Make sure that it is fully opened. Do not climb a closed stepladder.  Make sure that both sides of the stepladder are locked into place.  Ask someone to hold it for you, if possible.  Clearly mark and make sure that you can see:  Any grab bars or handrails.  First and last steps.  Where the edge of each step is.  Use tools that help you move around (mobility aids) if they are needed. These include:  Canes.  Walkers.  Scooters.  Crutches.  Turn on the lights when you go into a dark area. Replace any light bulbs as soon as they burn out.  Set up your furniture so you have a clear path. Avoid moving your furniture around.  If any of your floors are uneven, fix them.  If there are any pets around you, be aware of where they are.  Review your medicines with your doctor. Some medicines can make you feel dizzy. This can increase your chance of falling. Ask your doctor what other things that you can do to help prevent falls. This information is not intended to replace advice given to you by your health care provider. Make sure you discuss any questions you have with your health care provider. Document Released: 07/15/2009 Document Revised: 02/24/2016 Document Reviewed: 10/23/2014 Elsevier Interactive Patient Education  2017 [employer].

## 2019-08-04 NOTE — Progress Notes (Signed)
Subjective:   Brittany Maxwell is a 53 y.o. female who presents for Medicare Annual (Subsequent) preventive examination.   Location of Patient: Home Location of Provider: Telehealth Consent was obtain for visit to be over via telehealth.  I verified that I am speaking with the correct person using two identifiers.   Review of Systems:  Cardiac Risk Factors include: diabetes mellitus;hypertension;dyslipidemia;obesity (BMI >30kg/m2);sedentary lifestyle     Objective:     Vitals: BP 118/80   Temp 98.2 F (36.8 C)   Resp 15   Ht 5\' 4"  (1.626 m)   Wt 195 lb 3.2 oz (88.5 kg)   BMI 33.51 kg/m   Body mass index is 33.51 kg/m.  Advanced Directives 08/04/2019 06/14/2017 05/23/2017  Does Patient Have a Medical Advance Directive? No No No  Would patient like information on creating a medical advance directive? - - Yes (MAU/Ambulatory/Procedural Areas - Information given)    Tobacco Social History   Tobacco Use  Smoking Status Never Smoker  Smokeless Tobacco Never Used     Counseling given: Not Answered   Clinical Intake:  Pre-visit preparation completed: No  Pain : No/denies pain     Nutritional Status: BMI > 30  Obese Diabetes: Yes CBG done?: No Did pt. bring in CBG monitor from home?: No  How often do you need to have someone help you when you read instructions, pamphlets, or other written materials from your doctor or pharmacy?: 1 - Never What is the last grade level you completed in school?: 14  Interpreter Needed?: No     Past Medical History:  Diagnosis Date  . Arthritis   . Diabetes mellitus without complication ([place])   . Hyperlipidemia   . Hypertension    Past Surgical History:  Procedure Laterality Date  . CHOLECYSTECTOMY    . COLONOSCOPY N/A 05/23/2017   Procedure: COLONOSCOPY;  Surgeon: Rogene Houston, MD;  Location: [facility];  Service: Endoscopy;  Laterality: N/A;  930  . TUBAL LIGATION     Family History  Problem Relation Age of  Onset  . Hypertension Mother   . Hyperlipidemia Mother   . Stroke Father   . Hypertension Father   . Hyperlipidemia Father   . Hypertension Sister    Social History   Socioeconomic History  . Marital status: Married    Spouse name: Not on file  . Number of children: Not on file  . Years of education: Not on file  . Highest education level: Not on file  Occupational History  . Not on file  Social Needs  . Financial resource strain: Not on file  . Food insecurity    Worry: Not on file    Inability: Not on file  . Transportation needs    Medical: Not on file    Non-medical: Not on file  Tobacco Use  . Smoking status: Never Smoker  . Smokeless tobacco: Never Used  Substance and Sexual Activity  . Alcohol use: No  . Drug use: No  . Sexual activity: Never  Lifestyle  . Physical activity    Days per week: Not on file    Minutes per session: Not on file  . Stress: Not on file  Relationships  . Social Herbalist on phone: Not on file    Gets together: Not on file    Attends religious service: Not on file    Active member of club or organization: Not on file    Attends meetings  of clubs or organizations: Not on file    Relationship status: Not on file  Other Topics Concern  . Not on file  Social History Narrative  . Not on file    Outpatient Encounter Medications as of 08/04/2019  Medication Sig  . acetaminophen (TYLENOL) 500 MG tablet Take 500 mg by mouth every 8 (eight) hours as needed for mild pain or moderate pain.   Marland Kitchen. amLODipine (NORVASC) 10 MG tablet Take 1 tablet (10 mg total) by mouth daily.  . cholecalciferol (VITAMIN D) 1000 units tablet Take 1,000 Units by mouth daily.  . cloNIDine (CATAPRES) 0.1 MG tablet TAKE 1 TABLET BY MOUTH AT BEDTIME FOR  UNCONTROLLED  BLOOD  PRESSURE  . DULoxetine (CYMBALTA) 60 MG capsule Take 60 mg by mouth every other day.  . loratadine (CLARITIN) 10 MG tablet Take 1 tablet (10 mg total) by mouth daily.  . Multiple  Vitamin (MULTIVITAMIN) capsule Take 1 capsule by mouth daily.    . phentermine (ADIPEX-P) 37.5 MG tablet Take 1 tablet (37.5 mg total) by mouth daily before breakfast.  . potassium chloride SA (K-DUR) 20 MEQ tablet Take 1 tablet (20 mEq total) by mouth 2 (two) times daily.  . pravastatin (PRAVACHOL) 40 MG tablet Take 1 tablet (40 mg total) by mouth daily.  . traMADol (ULTRAM) 50 MG tablet Take 50 mg by mouth as needed.  . triamterene-hydrochlorothiazide (MAXZIDE) 75-50 MG tablet Take 1 tablet by mouth daily.   No facility-administered encounter medications on file as of 08/04/2019.     Activities of Daily Living In your present state of health, do you have any difficulty performing the following activities: 08/04/2019  Hearing? N  Vision? N  Difficulty concentrating or making decisions? N  Walking or climbing stairs? N  Dressing or bathing? N  Doing errands, shopping? N  Some recent data might be hidden    Patient Care Team: Kerri PerchesSimpson, Margaret E, MD as PCP - General Beryle Beamsoonquah, Kofi, MD as Consulting Physician (Neurology)    Assessment:   This is a routine wellness examination for Brittany Maxwell.  Exercise Activities and Dietary recommendations Current Exercise Habits: Home exercise routine, Type of exercise: walking;stretching, Time (Minutes): 30, Frequency (Times/Week): 4, Weekly Exercise (Minutes/Week): 120, Intensity: Mild, Exercise limited by: cardiac condition(s)  Goals   None     Fall Risk Fall Risk  08/04/2019 07/29/2019 03/26/2019 02/10/2019 09/12/2018  Falls in the past year? 0 0 0 0 0  Number falls in past yr: 0 0 0 0 -  Injury with Fall? 0 0 0 0 -  Follow up Falls evaluation completed;Education provided - - - -   Is the patient's home free of loose throw rugs in walkways, pet beds, electrical cords, etc?   yes       Grab bars in the bathroom? yes      Handrails on the stairs?   yes      Adequate lighting?   yes  Depression Screen PHQ 2/9 Scores 08/04/2019 03/26/2019 02/10/2019  09/12/2018  PHQ - 2 Score 0 0 0 0  PHQ- 9 Score - - - -     Cognitive Function     6CIT Screen 08/04/2019  What Year? 0 points  What month? 0 points  What time? 0 points  Count back from 20 0 points  Months in reverse 0 points  Repeat phrase 0 points  Total Score 0    Immunization History  Administered Date(s) Administered  . Influenza Split 08/28/2011  . Influenza Whole  07/06/2009  . Influenza,inj,Quad PF,6+ Mos 07/01/2013, 09/18/2014, 11/11/2015, 06/19/2016, 07/05/2017, 09/12/2018, 07/29/2019  . Pneumococcal Polysaccharide-23 07/01/2013, 07/29/2019  . Tdap 08/28/2011  . Zoster Recombinat (Shingrix) 03/26/2019, 06/04/2019    Qualifies for Shingles Vaccine? completed  Screening Tests Health Maintenance  Topic Date Due  . OPHTHALMOLOGY EXAM  06/13/2018  . HEMOGLOBIN A1C  09/24/2019  . FOOT EXAM  03/25/2020  . PAP SMEAR-Modifier  03/20/2021  . MAMMOGRAM  04/08/2021  . TETANUS/TDAP  08/27/2021  . COLONOSCOPY  05/24/2027  . INFLUENZA VACCINE  Completed  . PNEUMOCOCCAL POLYSACCHARIDE VACCINE AGE 4-64 HIGH RISK  Completed  . HIV Screening  Completed    Cancer Screenings: Lung: Low Dose CT Chest recommended if Age 56-80 years, 30 pack-year currently smoking OR have quit w/in 15years. Patient does not qualify. Breast:  Up to date on Mammogram? Yes   Up to date of Bone Density/Dexa?  Due at age 5  Colorectal: Due 2028  Additional Screenings:  Hepatitis C Screening: Can be added to next set of labs     Plan:     1. Encounter for Medicare annual wellness exam  I have personally reviewed and noted the following in the patient's chart:   . Medical and social history . Use of alcohol, tobacco or illicit drugs  . Current medications and supplements . Functional ability and status . Nutritional status . Physical activity . Advanced directives . List of other physicians . Hospitalizations, surgeries, and ER visits in previous 12 months . Vitals . Screenings to  include cognitive, depression, and falls . Referrals and appointments  In addition, I have reviewed and discussed with patient certain preventive protocols, quality metrics, and best practice recommendations. A written personalized care plan for preventive services as well as general preventive health recommendations were provided to patient.     I provided 20 minutes of non-face-to-face time during this encounter.   Freddy Finner, NP  08/04/2019

## 2019-08-05 ENCOUNTER — Encounter: Payer: BC Managed Care – PPO | Admitting: Family Medicine

## 2019-08-05 ENCOUNTER — Ambulatory Visit: Payer: Medicare Other

## 2019-08-05 ENCOUNTER — Ambulatory Visit (HOSPITAL_COMMUNITY): Payer: BC Managed Care – PPO

## 2019-08-08 ENCOUNTER — Ambulatory Visit (HOSPITAL_COMMUNITY)
Admission: RE | Admit: 2019-08-08 | Discharge: 2019-08-08 | Disposition: A | Discharge status: Home / Self Care | Payer: BC Managed Care – PPO | Source: Ambulatory Visit | Attending: Family Medicine | Admitting: Family Medicine

## 2019-08-08 ENCOUNTER — Other Ambulatory Visit: Payer: Self-pay

## 2019-08-08 DIAGNOSIS — N83201 Unspecified ovarian cyst, right side: Secondary | ICD-10-CM | POA: Diagnosis not present

## 2019-08-08 DIAGNOSIS — N83202 Unspecified ovarian cyst, left side: Secondary | ICD-10-CM | POA: Insufficient documentation

## 2019-08-08 DIAGNOSIS — N83209 Unspecified ovarian cyst, unspecified side: Secondary | ICD-10-CM | POA: Diagnosis not present

## 2019-08-08 DIAGNOSIS — N926 Irregular menstruation, unspecified: Secondary | ICD-10-CM | POA: Diagnosis not present

## 2019-10-29 ENCOUNTER — Other Ambulatory Visit: Payer: Self-pay | Admitting: Family Medicine

## 2019-11-11 ENCOUNTER — Ambulatory Visit: Payer: BC Managed Care – PPO | Admitting: Family Medicine

## 2019-11-11 DIAGNOSIS — M5416 Radiculopathy, lumbar region: Secondary | ICD-10-CM | POA: Diagnosis not present

## 2019-11-11 DIAGNOSIS — M545 Low back pain: Secondary | ICD-10-CM | POA: Diagnosis not present

## 2019-11-11 DIAGNOSIS — E119 Type 2 diabetes mellitus without complications: Secondary | ICD-10-CM | POA: Diagnosis not present

## 2019-11-11 DIAGNOSIS — I10 Essential (primary) hypertension: Secondary | ICD-10-CM | POA: Diagnosis not present

## 2019-11-24 ENCOUNTER — Ambulatory Visit (INDEPENDENT_AMBULATORY_CARE_PROVIDER_SITE_OTHER): Payer: BC Managed Care – PPO | Admitting: Family Medicine

## 2019-11-24 ENCOUNTER — Encounter: Payer: Self-pay | Admitting: Family Medicine

## 2019-11-24 ENCOUNTER — Other Ambulatory Visit: Payer: Self-pay

## 2019-11-24 VITALS — BP 145/96 | Ht 64.0 in | Wt 186.0 lb

## 2019-11-24 DIAGNOSIS — E559 Vitamin D deficiency, unspecified: Secondary | ICD-10-CM

## 2019-11-24 DIAGNOSIS — E7800 Pure hypercholesterolemia, unspecified: Secondary | ICD-10-CM

## 2019-11-24 DIAGNOSIS — E669 Obesity, unspecified: Secondary | ICD-10-CM | POA: Diagnosis not present

## 2019-11-24 DIAGNOSIS — E66811 Obesity, class 1: Secondary | ICD-10-CM

## 2019-11-24 DIAGNOSIS — E8881 Metabolic syndrome: Secondary | ICD-10-CM

## 2019-11-24 DIAGNOSIS — I10 Essential (primary) hypertension: Secondary | ICD-10-CM

## 2019-11-24 DIAGNOSIS — R7303 Prediabetes: Secondary | ICD-10-CM

## 2019-11-24 DIAGNOSIS — G5603 Carpal tunnel syndrome, bilateral upper limbs: Secondary | ICD-10-CM | POA: Diagnosis not present

## 2019-11-24 DIAGNOSIS — N926 Irregular menstruation, unspecified: Secondary | ICD-10-CM

## 2019-11-24 DIAGNOSIS — R7301 Impaired fasting glucose: Secondary | ICD-10-CM

## 2019-11-24 MED ORDER — AMLODIPINE BESYLATE 10 MG PO TABS: 10.0000 mg | ORAL_TABLET | Freq: Every day | ORAL | 1 refills | Status: DC

## 2019-11-24 MED ORDER — CLONIDINE HCL 0.1 MG PO TABS: ORAL_TABLET | ORAL | 1 refills | Status: DC

## 2019-11-24 MED ORDER — POTASSIUM CHLORIDE 20 MEQ/15ML (10%) PO SOLN: 20.0000 meq | Freq: Two times a day (BID) | ORAL | 3 refills | Status: DC

## 2019-11-24 MED ORDER — PRAVASTATIN SODIUM 40 MG PO TABS: 40.0000 mg | ORAL_TABLET | Freq: Every day | ORAL | 1 refills | Status: DC

## 2019-11-24 NOTE — Assessment & Plan Note (Signed)
Improved. Pt applauded on succesful weight loss through lifestyle change, and encouraged to continue same. Weight loss goal set for the next several months. hOLD PHENTERMINE UNTIL BP NORMALIZES

## 2019-11-24 NOTE — Addendum Note (Signed)
Addended by: Abner Greenspan on: 11/24/2019 03:13 PM   Modules accepted: Orders

## 2019-11-24 NOTE — Assessment & Plan Note (Signed)
[name] is reminded of the importance of commitment to daily physical activity for 30 minutes or more, as able and the need to limit carbohydrate intake to 30 to 60 grams per meal to help with blood sugar control.   The need to take medication as prescribed, test blood sugar as directed, and to call between visits if there is a concern that blood sugar is uncontrolled is also discussed.   [name] is reminded of the importance of daily foot exam, annual eye examination, and good blood sugar, blood pressure and cholesterol control. Updated lab needed at/ before next visit.   Diabetic Labs Latest Ref Rng & Units 03/25/2019 09/12/2018 09/10/2018 07/05/2017 12/21/2016  HbA1c <5.7 % of total Hgb 6.2(H) - 6.2(H) 6.2(H) -  Microalbumin Not Estab. ug/mL - 45.2(H) - - 72.5(H)  Micro/Creat Ratio 0.0 - 30.0 mg/g creat - 26.5 - - 21.3  Chol <200 mg/dL 188 - 416(S) 063 -  HDL > OR = 50 mg/dL 62 - 51 56 -  Calc LDL mg/dL (calc) 85 - 016(W) 98 -  Triglycerides <150 mg/dL 87 - 109 323(F) -  Creatinine 0.50 - 1.05 mg/dL 5.73(U) - 2.02(R) 4.27(C) -   BP/Weight 11/24/2019 08/04/2019 07/29/2019 06/04/2019 03/26/2019 02/11/2019 09/12/2018  Systolic BP 145 118 118 140 118 156 140  Diastolic BP 96 80 80 89 84 108 96  Wt. (Lbs) 186 195.2 199 195.04 199 198.6 205  BMI 31.93 33.51 34.16 33.48 35.25 35.18 36.31   Foot/eye exam completion dates Latest Ref Rng & Units 03/20/2018 02/21/2017  Eye Exam No Retinopathy - No Retinopathy  Foot Form Completion - Done -

## 2019-11-24 NOTE — Assessment & Plan Note (Signed)
NEW DIAGNOSIS, STARTING WITH SPLINTS

## 2019-11-24 NOTE — Assessment & Plan Note (Signed)
UNCONTROLLED, IN OFFICE RE EVAL IN AM TO DERERINE MEDICATION ADJUSTMENT DASH diet and commitment to daily physical activity for a minimum of 30 minutes discussed and encouraged, as a part of hypertension management. The importance of attaining a healthy weight is also discussed.  BP/Weight 11/24/2019 08/04/2019 07/29/2019 06/04/2019 03/26/2019 02/11/2019 09/12/2018  Systolic BP 145 118 118 140 118 156 140  Diastolic BP 96 80 80 89 84 108 96  Wt. (Lbs) 186 195.2 199 195.04 199 198.6 205  BMI 31.93 33.51 34.16 33.48 35.25 35.18 36.31

## 2019-11-24 NOTE — Progress Notes (Signed)
Virtual Visit via Telephone Note  I connected with Brittany Maxwell on 11/24/19 at  2:40 PM EST by telephone and verified that I am speaking with the correct person using two identifiers.  Location: Patient: home  Provider: office    I discussed the limitations, risks, security and privacy concerns of performing an evaluation and management service by telephone and the availability of in person appointments. I also discussed with the patient that there may be a patient responsible charge related to this service. The patient expressed understanding and agreed to proceed.   History of Present Illness: REPORTS RECENTLY ELEVATED AND UNCONTROLLED BLOOD PRESSURE READINGS ASSOCIATED WITH HEADACHES, WHICH CONCERN HER.HAS RECENTLY RESUMED CLONIDINE IN PAST 3 WEEKS, AND FINDS THAT HER BLOOD PRESSURE HAS IMPROVED BUT IS NOT AT GOAL, CONTINUES TO WORK CONSISTENTLY ON DIET AND EXERCISE F/U chronic problems, medication review, and refill medication when necessary. Review most recent labs and order labs which are due Review preventive health and update with necessary referrals or immunizations as indicated Denies recent fever or chills. Denies sinus pressure, nasal congestion, ear pain or sore throat. Denies chest congestion, productive cough or wheezing. Denies chest pains, palpitations and leg swelling Denies abdominal pain, nausea, vomiting,diarrhea or constipation.   Denies dysuria, frequency, hesitancy or incontinence. Denies joint pain, swelling and limitation in mobility. Denies headaches, seizures, numbness, or tingling. Denies depression, anxiety or insomnia. Denies skin break down or rash.       Observations/Objective: BP (!) 145/96   Ht 5\' 4"  (1.626 m)   Wt 186 lb (84.4 kg)   BMI 31.93 kg/m  Good communication with no confusion and intact memory. Alert and oriented x 3 No signs of respiratory distress during speech    Assessment and Plan:  Essential hypertension,  benign UNCONTROLLED, IN OFFICE RE EVAL IN AM TO DERERINE MEDICATION ADJUSTMENT DASH diet and commitment to daily physical activity for a minimum of 30 minutes discussed and encouraged, as a part of hypertension management. The importance of attaining a healthy weight is also discussed.  BP/Weight 11/24/2019 08/04/2019 07/29/2019 06/04/2019 03/26/2019 02/11/2019 09/12/2018  Systolic BP 145 118 118 140 118 156 140  Diastolic BP 96 80 80 89 84 108 96  Wt. (Lbs) 186 195.2 199 195.04 199 198.6 205  BMI 31.93 33.51 34.16 33.48 35.25 35.18 36.31       Prediabetes Brittany Maxwell is reminded of the importance of commitment to daily physical activity for 30 minutes or more, as able and the need to limit carbohydrate intake to 30 to 60 grams per meal to help with blood sugar control.   The need to take medication as prescribed, test blood sugar as directed, and to call between visits if there is a concern that blood sugar is uncontrolled is also discussed.   Brittany Maxwell is reminded of the importance of daily foot exam, annual eye examination, and good blood sugar, blood pressure and cholesterol control. Updated lab needed at/ before next visit.   Diabetic Labs Latest Ref Rng & Units 03/25/2019 09/12/2018 09/10/2018 07/05/2017 12/21/2016  HbA1c <5.7 % of total Hgb 6.2(H) - 6.2(H) 6.2(H) -  Microalbumin Not Estab. ug/mL - 45.2(H) - - 72.5(H)  Micro/Creat Ratio 0.0 - 30.0 mg/g creat - 26.5 - - 21.3  Chol <200 mg/dL 12/23/2016 - 361) 443(X -  HDL > OR = 50 mg/dL 62 - 51 56 -  Calc LDL mg/dL (calc) 85 - 540) 98 -  Triglycerides <150 mg/dL 87 - 086(P 619) -  Creatinine 0.50 -  1.05 mg/dL 1.09(H) - 1.15(H) 1.09(H) -   BP/Weight 11/24/2019 08/04/2019 07/29/2019 06/04/2019 03/26/2019 02/11/2019 94/58/5929  Systolic BP 244 628 638 177 116 579 038  Diastolic BP 96 80 80 89 84 108 96  Wt. (Lbs) 186 195.2 199 195.04 199 198.6 205  BMI 31.93 33.51 34.16 33.48 35.25 35.18 36.31   Foot/eye exam completion dates Latest Ref  Rng & Units 03/20/2018 02/21/2017  Eye Exam No Retinopathy - No Retinopathy  Foot Form Completion - Done -        Obesity (BMI 30.0-34.9) Improved. Pt applauded on succesful weight loss through lifestyle change, and encouraged to continue same. Weight loss goal set for the next several months. hOLD PHENTERMINE UNTIL BP NORMALIZES  Carpal tunnel syndrome, bilateral NEW DIAGNOSIS, STARTING WITH SPLINTS   Follow Up Instructions:    I discussed the assessment and treatment plan with the patient. The patient was provided an opportunity to ask questions and all were answered. The patient agreed with the plan and demonstrated an understanding of the instructions.   The patient was advised to call back or seek an in-person evaluation if the symptoms worsen or if the condition fails to improve as anticipated.  I provided 20 minutes of non-face-to-face time during this encounter.   Tula Nakayama, MD

## 2019-11-24 NOTE — Patient Instructions (Addendum)
F/U in office with MD in 10 weeks , call if you need me before, re evaluate blood pressure  Fasting labs,  NO urine specimen tomorrow  Pt to stop in office tomorrow morning for BP evaluation to be linked with thi visit.  No phentermine until BP is normal  If bracing does not work for carpal tunnel, you may benefit from injections, and if not successful then Orthopedics will be needed to evaluate ( left worse than right)   It is important that you exercise regularly at least 30 minutes 5 times a week. If you develop chest pain, have severe difficulty breathing, or feel very tired, stop exercising immediately and seek medical attention    Think about what you will eat, plan ahead. Choose " clean, green, fresh or frozen" over canned, processed or packaged foods which are more sugary, salty and fatty. 70 to 75% of food eaten should be vegetables and fruit. Three meals at set times with snacks allowed between meals, but they must be fruit or vegetables. Aim to eat over a 12 hour period , example 7 am to 7 pm, and STOP after  your last meal of the day. Drink water,generally about 64 ounces per day, no other drink is as healthy. Fruit juice is best enjoyed in a healthy way, by EATING the fruit. Thanks for choosing [facility], we consider it a privelige to serve you.

## 2019-11-25 ENCOUNTER — Ambulatory Visit: Payer: BC Managed Care – PPO | Admitting: Family Medicine

## 2019-11-25 ENCOUNTER — Other Ambulatory Visit: Payer: Self-pay | Admitting: Family Medicine

## 2019-11-25 DIAGNOSIS — N926 Irregular menstruation, unspecified: Secondary | ICD-10-CM | POA: Diagnosis not present

## 2019-11-25 DIAGNOSIS — I10 Essential (primary) hypertension: Secondary | ICD-10-CM | POA: Diagnosis not present

## 2019-11-25 DIAGNOSIS — Z79899 Other long term (current) drug therapy: Secondary | ICD-10-CM | POA: Diagnosis not present

## 2019-11-25 DIAGNOSIS — E559 Vitamin D deficiency, unspecified: Secondary | ICD-10-CM | POA: Diagnosis not present

## 2019-11-25 DIAGNOSIS — R7301 Impaired fasting glucose: Secondary | ICD-10-CM | POA: Diagnosis not present

## 2019-11-25 MED ORDER — PHENTERMINE HCL 37.5 MG PO TABS: 37.5000 mg | ORAL_TABLET | Freq: Every day | ORAL | 2 refills | Status: DC

## 2019-11-25 NOTE — Addendum Note (Signed)
Addended by: Abner Greenspan on: 11/25/2019 09:31 AM   Modules accepted: Orders

## 2019-11-26 LAB — COMPLETE METABOLIC PANEL WITH GFR
AG Ratio: 1.2 (calc) (ref 1.0–2.5)
ALT: 12 U/L (ref 6–29)
AST: 14 U/L (ref 10–35)
Albumin: 3.8 g/dL (ref 3.6–5.1)
Alkaline phosphatase (APISO): 73 U/L (ref 37–153)
BUN/Creatinine Ratio: 13 (calc) (ref 6–22)
BUN: 15 mg/dL (ref 7–25)
CO2: 31 mmol/L (ref 20–32)
Calcium: 9.3 mg/dL (ref 8.6–10.4)
Chloride: 102 mmol/L (ref 98–110)
Creat: 1.15 mg/dL — ABNORMAL HIGH (ref 0.50–1.05)
GFR, Est African American: 62 mL/min/{1.73_m2} (ref >=60)
GFR, Est Non African American: 54 mL/min/{1.73_m2} — ABNORMAL LOW (ref >=60)
Globulin: 3.3 g/dL (calc) (ref 1.9–3.7)
Glucose, Bld: 97 mg/dL (ref 65–99)
Potassium: 3.4 mmol/L — ABNORMAL LOW (ref 3.5–5.3)
Sodium: 141 mmol/L (ref 135–146)
Total Bilirubin: 0.3 mg/dL (ref 0.2–1.2)
Total Protein: 7.1 g/dL (ref 6.1–8.1)

## 2019-11-26 LAB — HEMOGLOBIN A1C
Hgb A1c MFr Bld: 6.2 % of total Hgb — ABNORMAL HIGH (ref <5.7)
Mean Plasma Glucose: 131 (calc)
eAG (mmol/L): 7.3 (calc)

## 2019-11-26 LAB — LIPID PANEL
Cholesterol: 166 mg/dL (ref <200)
HDL: 54 mg/dL (ref >=50)
LDL Cholesterol (Calc): 95 mg/dL (calc)
Non-HDL Cholesterol (Calc): 112 mg/dL (calc) (ref <130)
Total CHOL/HDL Ratio: 3.1 (calc) (ref <5.0)
Triglycerides: 76 mg/dL (ref <150)

## 2019-11-26 LAB — TEST AUTHORIZATION

## 2019-11-26 LAB — TSH: TSH: 2.52 mIU/L

## 2019-11-26 LAB — CBC
HCT: 34.5 % — ABNORMAL LOW (ref 35.0–45.0)
Hemoglobin: 11.3 g/dL — ABNORMAL LOW (ref 11.7–15.5)
MCH: 27.6 pg (ref 27.0–33.0)
MCHC: 32.8 g/dL (ref 32.0–36.0)
MCV: 84.1 fL (ref 80.0–100.0)
MPV: 9.7 fL (ref 7.5–12.5)
Platelets: 373 10*3/uL (ref 140–400)
RBC: 4.1 10*6/uL (ref 3.80–5.10)
RDW: 15.2 % — ABNORMAL HIGH (ref 11.0–15.0)
WBC: 7.2 10*3/uL (ref 3.8–10.8)

## 2019-11-26 LAB — FERRITIN: Ferritin: 13 ng/mL — ABNORMAL LOW (ref 16–232)

## 2019-11-26 LAB — VITAMIN D 25 HYDROXY (VIT D DEFICIENCY, FRACTURES): Vit D, 25-Hydroxy: 14 ng/mL — ABNORMAL LOW (ref 30–100)

## 2019-11-26 LAB — FSH/LH
FSH: 46.7 m[IU]/mL
LH: 38.3 m[IU]/mL

## 2019-11-26 LAB — IRON: Iron: 39 ug/dL — ABNORMAL LOW (ref 45–160)

## 2019-12-03 ENCOUNTER — Encounter: Payer: Self-pay | Admitting: Family Medicine

## 2019-12-03 ENCOUNTER — Other Ambulatory Visit: Payer: Self-pay | Admitting: Family Medicine

## 2019-12-03 MED ORDER — ERGOCALCIFEROL 1.25 MG (50000 UT) PO CAPS: 50000.0000 [IU] | ORAL_CAPSULE | ORAL | 2 refills | Status: DC

## 2019-12-03 NOTE — Progress Notes (Signed)
Vitamin d

## 2020-01-09 ENCOUNTER — Telehealth: Payer: Self-pay

## 2020-01-09 NOTE — Telephone Encounter (Signed)
Pt LVM  is wanting Claritin called in

## 2020-01-12 ENCOUNTER — Other Ambulatory Visit: Payer: Self-pay | Admitting: Family Medicine

## 2020-01-12 MED ORDER — LORATADINE 10 MG PO TABS: 10.0000 mg | ORAL_TABLET | Freq: Every day | ORAL | 5 refills | Status: DC

## 2020-01-12 NOTE — Telephone Encounter (Signed)
Claritin is prescribed

## 2020-02-05 ENCOUNTER — Other Ambulatory Visit: Payer: Self-pay | Admitting: Family Medicine

## 2020-02-09 ENCOUNTER — Ambulatory Visit: Payer: BC Managed Care – PPO | Admitting: Family Medicine

## 2020-05-12 DIAGNOSIS — M545 Low back pain: Secondary | ICD-10-CM | POA: Diagnosis not present

## 2020-05-12 DIAGNOSIS — G5601 Carpal tunnel syndrome, right upper limb: Secondary | ICD-10-CM | POA: Diagnosis not present

## 2020-05-12 DIAGNOSIS — I10 Essential (primary) hypertension: Secondary | ICD-10-CM | POA: Diagnosis not present

## 2020-05-12 DIAGNOSIS — M5416 Radiculopathy, lumbar region: Secondary | ICD-10-CM | POA: Diagnosis not present

## 2020-05-25 ENCOUNTER — Other Ambulatory Visit (HOSPITAL_COMMUNITY): Payer: Self-pay | Admitting: Family Medicine

## 2020-05-25 DIAGNOSIS — Z1231 Encounter for screening mammogram for malignant neoplasm of breast: Secondary | ICD-10-CM

## 2020-05-28 ENCOUNTER — Other Ambulatory Visit: Payer: Self-pay

## 2020-05-28 ENCOUNTER — Ambulatory Visit (HOSPITAL_COMMUNITY)
Admission: RE | Admit: 2020-05-28 | Discharge: 2020-05-28 | Disposition: A | Discharge status: Home / Self Care | Payer: BC Managed Care – PPO | Source: Ambulatory Visit | Attending: Family Medicine | Admitting: Family Medicine

## 2020-05-28 DIAGNOSIS — Z1231 Encounter for screening mammogram for malignant neoplasm of breast: Secondary | ICD-10-CM

## 2020-06-01 ENCOUNTER — Other Ambulatory Visit: Payer: Self-pay

## 2020-06-01 ENCOUNTER — Other Ambulatory Visit: Payer: Self-pay | Admitting: Family Medicine

## 2020-06-01 ENCOUNTER — Ambulatory Visit (INDEPENDENT_AMBULATORY_CARE_PROVIDER_SITE_OTHER): Payer: BC Managed Care – PPO | Admitting: Family Medicine

## 2020-06-01 ENCOUNTER — Encounter: Payer: Self-pay | Admitting: Family Medicine

## 2020-06-01 VITALS — BP 124/86 | HR 84 | Ht 64.0 in | Wt 203.0 lb

## 2020-06-01 DIAGNOSIS — F321 Major depressive disorder, single episode, moderate: Secondary | ICD-10-CM | POA: Diagnosis not present

## 2020-06-01 DIAGNOSIS — E669 Obesity, unspecified: Secondary | ICD-10-CM

## 2020-06-01 DIAGNOSIS — Z23 Encounter for immunization: Secondary | ICD-10-CM

## 2020-06-01 DIAGNOSIS — R7303 Prediabetes: Secondary | ICD-10-CM

## 2020-06-01 DIAGNOSIS — G8929 Other chronic pain: Secondary | ICD-10-CM

## 2020-06-01 DIAGNOSIS — M5440 Lumbago with sciatica, unspecified side: Secondary | ICD-10-CM

## 2020-06-01 DIAGNOSIS — I10 Essential (primary) hypertension: Secondary | ICD-10-CM | POA: Diagnosis not present

## 2020-06-01 LAB — POCT GLYCOSYLATED HEMOGLOBIN (HGB A1C): Hemoglobin A1C: 6.3 % — AB (ref 4.0–5.6)

## 2020-06-01 MED ORDER — HYDROXYZINE HCL 25 MG PO TABS: ORAL_TABLET | ORAL | 3 refills | Status: DC

## 2020-06-01 MED ORDER — FLUOXETINE HCL 20 MG PO TABS: 20.0000 mg | ORAL_TABLET | Freq: Every day | ORAL | 3 refills | Status: DC

## 2020-06-01 MED ORDER — METFORMIN HCL 500 MG PO TABS: 500.0000 mg | ORAL_TABLET | Freq: Every day | ORAL | 1 refills | Status: DC

## 2020-06-01 NOTE — Patient Instructions (Addendum)
Follow-up in office with MD in 7 to 9 weeks call if you need me sooner.  Flu vaccine in office today.  GLYCOHB IN OFFICE TODAY  Blood sugar has increased slightly you are being started back on Metformin 1 tablet once daily.  New for depression is fluoxetine and to help with sleep is hydroxyzine.  You are also referred to therapist who will call you.  Please start iron with vitamin C this is over-the-counter take 1 tablet 2 times daily.  Labs today chem 7 and eGFR  It is important that you exercise regularly at least 30 minutes 5 times a week. If you develop chest pain, have severe difficulty breathing, or feel very tired, stop exercising immediately and seek medical attention  Think about what you will eat, plan ahead. Choose " clean, green, fresh or frozen" over canned, processed or packaged foods which are more sugary, salty and fatty. 70 to 75% of food eaten should be vegetables and fruit. Three meals at set times with snacks allowed between meals, but they must be fruit or vegetables. Aim to eat over a 12 hour period , example 7 am to 7 pm, and STOP after  your last meal of the day. Drink water,generally about 64 ounces per day, no other drink is as healthy. Fruit juice is best enjoyed in a healthy way, by EATING the fruit. Thanks for choosing [facility], we consider it a privelige to serve you.

## 2020-06-01 NOTE — Assessment & Plan Note (Signed)
Weight gin due to increased stress and depression  Patient re-educated about  the importance of commitment to a  minimum of 150 minutes of exercise per week as able.  The importance of healthy food choices with portion control discussed, as well as eating regularly and within a 12 hour window most days. The need to choose "clean , green" food 50 to 75% of the time is discussed, as well as to make water the primary drink and set a goal of 64 ounces water daily.    Weight /BMI 06/01/2020 11/24/2019 08/04/2019  WEIGHT 203 lb 186 lb 195 lb 3.2 oz  HEIGHT 5\' 4"  5\' 4"  5\' 4"   BMI 34.84 kg/m2 31.93 kg/m2 33.51 kg/m2

## 2020-06-01 NOTE — Assessment & Plan Note (Signed)
Increased , management through pain management

## 2020-06-01 NOTE — Progress Notes (Signed)
Brittany Maxwell     MRN: [id number]      DOB: [birth year]   HPI Brittany Maxwell is here for follow up and re-evaluation of chronic medical conditions, medication management and review of any available recent lab and radiology data.  Preventive health is updated, specifically  Cancer screening and Immunization.   Questions or concerns regarding consultations or procedures which the PT has had in the interim are  Addressed.Increased back pain and weight gain, poain management recently adjusted with good effect The PT denies any adverse reactions to current medications since the last visit.  C/o depression, has always felt marginalized by her Mother,who she feels  Does the same to her children and grand children, and when her marriage disintegrated this was not helpful    ROS Denies recent fever or chills. Denies sinus pressure, nasal congestion, ear pain or sore throat. Denies chest congestion, productive cough or wheezing. Denies chest pains, palpitations and leg swelling Denies abdominal pain, nausea, vomiting,diarrhea or constipation.   Denies dysuria, frequency, hesitancy or incontinence. . Denies headaches, seizures, numbness, or tingling. Denies skin break down or rash.   PE  BP 124/86    Pulse 84    Ht 5\' 4"  (1.626 m)    Wt 203 lb (92.1 kg)    SpO2 96%    BMI 34.84 kg/m   Patient alert and oriented and in no cardiopulmonary distress.  HEENT: No facial asymmetry, EOMI,     Neck supple .  Chest: Clear to auscultation bilaterally.  CVS: S1, S2 no murmurs, no S3.Regular rate.  ABD: Soft non tender.   Ext: No edema  MS: decreased  ROM spine, shoulders, hips and knees.  Skin: Intact, no ulcerations or rash noted.  Psych: Good eye contact, normal affect. Memory intact tearful and  depressed appearing.  CNS: CN 2-12 intact, power,  normal throughout.no focal deficits noted.   Assessment & Plan  Prediabetes Patient educated about the importance of limiting   Carbohydrate intake , the need to commit to daily physical activity for a minimum of 30 minutes , and to commit weight loss. The fact that changes in all these areas will reduce or eliminate all together the development of diabetes is stressed.   Diabetic Labs Latest Ref Rng & Units 06/01/2020 11/25/2019 03/25/2019 09/12/2018 09/10/2018  HbA1c 4.0 - 5.6 % 6.3(A) 6.2(H) 6.2(H) - 6.2(H)  Microalbumin Not Estab. ug/mL - - - 45.2(H) -  Micro/Creat Ratio 0.0 - 30.0 mg/g creat - - - 26.5 -  Chol <200 mg/dL - 14/07/2018 403 - 474)  HDL > OR = 50 mg/dL - 54 62 - 51  Calc LDL mg/dL (calc) - 95 85 - 259(D)  Triglycerides <150 mg/dL - 76 87 - 638(V  Creatinine 0.50 - 1.05 mg/dL - 564) 3.32(R) - 5.18(A)   BP/Weight 06/01/2020 11/24/2019 08/04/2019 07/29/2019 06/04/2019 03/26/2019 02/11/2019  Systolic BP 124 145 118 118 140 118 156  Diastolic BP 86 96 80 80 89 84 04/13/2019  Wt. (Lbs) 203 186 195.2 199 195.04 199 198.6  BMI 34.84 31.93 33.51 34.16 33.48 35.25 35.18   Foot/eye exam completion dates Latest Ref Rng & Units 03/20/2018 02/21/2017  Eye Exam No Retinopathy - No Retinopathy  Foot Form Completion - Done -    Deteriorated, resume once daily metformin  Essential hypertension, benign Controlled, no change in medication DASH diet and commitment to daily physical activity for a minimum of 30 minutes discussed and encouraged, as a part of hypertension management. The  importance of attaining a healthy weight is also discussed.  BP/Weight 06/01/2020 11/24/2019 08/04/2019 07/29/2019 06/04/2019 03/26/2019 02/11/2019  Systolic BP 124 145 118 118 140 118 156  Diastolic BP 86 96 80 80 89 84 014  Wt. (Lbs) 203 186 195.2 199 195.04 199 198.6  BMI 34.84 31.93 33.51 34.16 33.48 35.25 35.18       Obesity (BMI 30.0-34.9) Weight gin due to increased stress and depression  Patient re-educated about  the importance of commitment to a  minimum of 150 minutes of exercise per week as able.  The importance of healthy food choices  with portion control discussed, as well as eating regularly and within a 12 hour window most days. The need to choose "clean , green" food 50 to 75% of the time is discussed, as well as to make water the primary drink and set a goal of 64 ounces water daily.    Weight /BMI 06/01/2020 11/24/2019 08/04/2019  WEIGHT 203 lb 186 lb 195 lb 3.2 oz  HEIGHT 5\' 4"  5\' 4"  5\' 4"   BMI 34.84 kg/m2 31.93 kg/m2 33.51 kg/m2      LOW BACK PAIN Increased , management through pain management

## 2020-06-01 NOTE — Assessment & Plan Note (Signed)
Controlled, no change in medication DASH diet and commitment to daily physical activity for a minimum of 30 minutes discussed and encouraged, as a part of hypertension management. The importance of attaining a healthy weight is also discussed.  BP/Weight 06/01/2020 11/24/2019 08/04/2019 07/29/2019 06/04/2019 03/26/2019 02/11/2019  Systolic BP 124 145 118 118 140 118 156  Diastolic BP 86 96 80 80 89 84 677  Wt. (Lbs) 203 186 195.2 199 195.04 199 198.6  BMI 34.84 31.93 33.51 34.16 33.48 35.25 35.18

## 2020-06-01 NOTE — Assessment & Plan Note (Signed)
Patient educated about the importance of limiting  Carbohydrate intake , the need to commit to daily physical activity for a minimum of 30 minutes , and to commit weight loss. The fact that changes in all these areas will reduce or eliminate all together the development of diabetes is stressed.   Diabetic Labs Latest Ref Rng & Units 06/01/2020 11/25/2019 03/25/2019 09/12/2018 09/10/2018  HbA1c 4.0 - 5.6 % 6.3(A) 6.2(H) 6.2(H) - 6.2(H)  Microalbumin Not Estab. ug/mL - - - 45.2(H) -  Micro/Creat Ratio 0.0 - 30.0 mg/g creat - - - 26.5 -  Chol <200 mg/dL - 644 034 - 742(V)  HDL > OR = 50 mg/dL - 54 62 - 51  Calc LDL mg/dL (calc) - 95 85 - 956(L)  Triglycerides <150 mg/dL - 76 87 - 875  Creatinine 0.50 - 1.05 mg/dL - 6.43(P) 2.95(J) - 8.84(Z)   BP/Weight 06/01/2020 11/24/2019 08/04/2019 07/29/2019 06/04/2019 03/26/2019 02/11/2019  Systolic BP 124 145 118 118 140 118 156  Diastolic BP 86 96 80 80 89 84 660  Wt. (Lbs) 203 186 195.2 199 195.04 199 198.6  BMI 34.84 31.93 33.51 34.16 33.48 35.25 35.18   Foot/eye exam completion dates Latest Ref Rng & Units 03/20/2018 02/21/2017  Eye Exam No Retinopathy - No Retinopathy  Foot Form Completion - Done -    Deteriorated, resume once daily metformin

## 2020-06-02 ENCOUNTER — Ambulatory Visit (INDEPENDENT_AMBULATORY_CARE_PROVIDER_SITE_OTHER): Payer: BC Managed Care – PPO | Admitting: Psychiatry

## 2020-06-02 ENCOUNTER — Ambulatory Visit (HOSPITAL_COMMUNITY): Payer: Self-pay | Admitting: Psychiatry

## 2020-06-02 ENCOUNTER — Encounter (HOSPITAL_COMMUNITY): Payer: Self-pay | Admitting: Psychiatry

## 2020-06-02 DIAGNOSIS — F321 Major depressive disorder, single episode, moderate: Secondary | ICD-10-CM | POA: Diagnosis not present

## 2020-06-02 LAB — BASIC METABOLIC PANEL WITH GFR
BUN: 13 mg/dL (ref 7–25)
CO2: 31 mmol/L (ref 20–32)
Calcium: 9.9 mg/dL (ref 8.6–10.4)
Chloride: 102 mmol/L (ref 98–110)
Creat: 1.02 mg/dL (ref 0.50–1.05)
GFR, Est African American: 72 mL/min/{1.73_m2} (ref >=60)
GFR, Est Non African American: 62 mL/min/{1.73_m2} (ref >=60)
Glucose, Bld: 95 mg/dL (ref 65–139)
Potassium: 3.1 mmol/L — ABNORMAL LOW (ref 3.5–5.3)
Sodium: 142 mmol/L (ref 135–146)
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
## 2020-06-03 ENCOUNTER — Other Ambulatory Visit: Payer: Self-pay

## 2020-06-03 DIAGNOSIS — E876 Hypokalemia: Secondary | ICD-10-CM

## 2020-06-11 DIAGNOSIS — G5602 Carpal tunnel syndrome, left upper limb: Secondary | ICD-10-CM | POA: Diagnosis not present

## 2020-06-11 DIAGNOSIS — M5416 Radiculopathy, lumbar region: Secondary | ICD-10-CM | POA: Diagnosis not present

## 2020-06-11 DIAGNOSIS — G5601 Carpal tunnel syndrome, right upper limb: Secondary | ICD-10-CM | POA: Diagnosis not present

## 2020-06-11 DIAGNOSIS — M545 Low back pain: Secondary | ICD-10-CM | POA: Diagnosis not present

## 2020-06-25 DIAGNOSIS — G5602 Carpal tunnel syndrome, left upper limb: Secondary | ICD-10-CM | POA: Diagnosis not present

## 2020-06-25 DIAGNOSIS — G5601 Carpal tunnel syndrome, right upper limb: Secondary | ICD-10-CM | POA: Diagnosis not present

## 2020-07-06 ENCOUNTER — Other Ambulatory Visit: Payer: Self-pay | Admitting: Family Medicine

## 2020-08-04 ENCOUNTER — Telehealth: Payer: BC Managed Care – PPO | Admitting: Family Medicine

## 2020-08-04 ENCOUNTER — Encounter: Payer: BC Managed Care – PPO | Admitting: Family Medicine

## 2020-08-06 ENCOUNTER — Telehealth (INDEPENDENT_AMBULATORY_CARE_PROVIDER_SITE_OTHER): Payer: BC Managed Care – PPO | Admitting: Family Medicine

## 2020-08-06 ENCOUNTER — Encounter: Payer: Self-pay | Admitting: Family Medicine

## 2020-08-06 ENCOUNTER — Other Ambulatory Visit: Payer: Self-pay

## 2020-08-06 VITALS — Ht 64.0 in | Wt 208.0 lb

## 2020-08-06 DIAGNOSIS — G5602 Carpal tunnel syndrome, left upper limb: Secondary | ICD-10-CM | POA: Diagnosis not present

## 2020-08-06 DIAGNOSIS — Z0000 Encounter for general adult medical examination without abnormal findings: Secondary | ICD-10-CM

## 2020-08-06 DIAGNOSIS — I10 Essential (primary) hypertension: Secondary | ICD-10-CM | POA: Diagnosis not present

## 2020-08-06 DIAGNOSIS — M545 Low back pain, unspecified: Secondary | ICD-10-CM | POA: Diagnosis not present

## 2020-08-06 DIAGNOSIS — M5416 Radiculopathy, lumbar region: Secondary | ICD-10-CM | POA: Diagnosis not present

## 2020-08-06 MED ORDER — LORATADINE 10 MG PO TABS: 10.0000 mg | ORAL_TABLET | Freq: Every day | ORAL | 1 refills | Status: DC

## 2020-08-06 NOTE — Patient Instructions (Addendum)
Brittany Maxwell , Thank you for taking time to come for your Medicare Wellness Visit. I appreciate your ongoing commitment to your health goals. Please review the following plan we discussed and let me know if I can assist you in the future.   Screening recommendations/referrals: Colonoscopy: 05/24/27 Mammogram: 05/28/22 Bone Density: Age 55 Recommended yearly ophthalmology/optometry visit for glaucoma screening and checkup Recommended yearly dental visit for hygiene and checkup  Vaccinations: Influenza vaccine: Fall 2022 Pneumococcal vaccine: Complete Tdap vaccine: 08/27/21 Shingles vaccine: Complete  Advanced directives: No  Conditions/risks identified: None  Next appointment: 08/12/20 @ 3:40 pm.  Preventive Care 40-64 Years, Female Preventive care refers to lifestyle choices and visits with your health care provider that can promote health and wellness. What does preventive care include?  A yearly physical exam. This is also called an annual well check.  Dental exams once or twice a year.  Routine eye exams. Ask your health care provider how often you should have your eyes checked.  Personal lifestyle choices, including:  Daily care of your teeth and gums.  Regular physical activity.  Eating a healthy diet.  Avoiding tobacco and drug use.  Limiting alcohol use.  Practicing safe sex.  Taking low-dose aspirin daily starting at age 70.  Taking vitamin and mineral supplements as recommended by your health care provider. What happens during an annual well check? The services and screenings done by your health care provider during your annual well check will depend on your age, overall health, lifestyle risk factors, and family history of disease. Counseling  Your health care provider may ask you questions about your:  Alcohol use.  Tobacco use.  Drug use.  Emotional well-being.  Home and relationship well-being.  Sexual activity.  Eating habits.  Work and  work Statistician.  Method of birth control.  Menstrual cycle.  Pregnancy history. Screening  You may have the following tests or measurements:  Height, weight, and BMI.  Blood pressure.  Lipid and cholesterol levels. These may be checked every 5 years, or more frequently if you are over 57 years old.  Skin check.  Lung cancer screening. You may have this screening every year starting at age 53 if you have a 30-pack-year history of smoking and currently smoke or have quit within the past 15 years.  Fecal occult blood test (FOBT) of the stool. You may have this test every year starting at age 72.  Flexible sigmoidoscopy or colonoscopy. You may have a sigmoidoscopy every 5 years or a colonoscopy every 10 years starting at age 82.  Hepatitis C blood test.  Hepatitis B blood test.  Sexually transmitted disease (STD) testing.  Diabetes screening. This is done by checking your blood sugar (glucose) after you have not eaten for a while (fasting). You may have this done every 1-3 years.  Mammogram. This may be done every 1-2 years. Talk to your health care provider about when you should start having regular mammograms. This may depend on whether you have a family history of breast cancer.  BRCA-related cancer screening. This may be done if you have a family history of breast, ovarian, tubal, or peritoneal cancers.  Pelvic exam and Pap test. This may be done every 3 years starting at age 49. Starting at age 58, this may be done every 5 years if you have a Pap test in combination with an HPV test.  Bone density scan. This is done to screen for osteoporosis. You may have this scan if you are at high  risk for osteoporosis. Discuss your test results, treatment options, and if necessary, the need for more tests with your health care provider. Vaccines  Your health care provider may recommend certain vaccines, such as:  Influenza vaccine. This is recommended every year.  Tetanus,  diphtheria, and acellular pertussis (Tdap, Td) vaccine. You may need a Td booster every 10 years.  Zoster vaccine. You may need this after age 67.  Pneumococcal 13-valent conjugate (PCV13) vaccine. You may need this if you have certain conditions and were not previously vaccinated.  Pneumococcal polysaccharide (PPSV23) vaccine. You may need one or two doses if you smoke cigarettes or if you have certain conditions. Talk to your health care provider about which screenings and vaccines you need and how often you need them. This information is not intended to replace advice given to you by your health care provider. Make sure you discuss any questions you have with your health care provider. Document Released: 10/15/2015 Document Revised: 06/07/2016 Document Reviewed: 07/20/2015 Elsevier Interactive Patient Education  2017 [place] Prevention in the Home Falls can cause injuries. They can happen to people of all ages. There are many things you can do to make your home safe and to help prevent falls. What can I do on the outside of my home?  Regularly fix the edges of walkways and driveways and fix any cracks.  Remove anything that might make you trip as you walk through a door, such as a raised step or threshold.  Trim any bushes or trees on the path to your home.  Use bright outdoor lighting.  Clear any walking paths of anything that might make someone trip, such as rocks or tools.  Regularly check to see if handrails are loose or broken. Make sure that both sides of any steps have handrails.  Any raised decks and porches should have guardrails on the edges.  Have any leaves, snow, or ice cleared regularly.  Use sand or salt on walking paths during winter.  Clean up any spills in your garage right away. This includes oil or grease spills. What can I do in the bathroom?  Use night lights.  Install grab bars by the toilet and in the tub and shower. Do not use towel  bars as grab bars.  Use non-skid mats or decals in the tub or shower.  If you need to sit down in the shower, use a plastic, non-slip stool.  Keep the floor dry. Clean up any water that spills on the floor as soon as it happens.  Remove soap buildup in the tub or shower regularly.  Attach bath mats securely with double-sided non-slip rug tape.  Do not have throw rugs and other things on the floor that can make you trip. What can I do in the bedroom?  Use night lights.  Make sure that you have a light by your bed that is easy to reach.  Do not use any sheets or blankets that are too big for your bed. They should not hang down onto the floor.  Have a firm chair that has side arms. You can use this for support while you get dressed.  Do not have throw rugs and other things on the floor that can make you trip. What can I do in the kitchen?  Clean up any spills right away.  Avoid walking on wet floors.  Keep items that you use a lot in easy-to-reach places.  If you need to reach something  above you, use a strong step stool that has a grab bar.  Keep electrical cords out of the way.  Do not use floor polish or wax that makes floors slippery. If you must use wax, use non-skid floor wax.  Do not have throw rugs and other things on the floor that can make you trip. What can I do with my stairs?  Do not leave any items on the stairs.  Make sure that there are handrails on both sides of the stairs and use them. Fix handrails that are broken or loose. Make sure that handrails are as long as the stairways.  Check any carpeting to make sure that it is firmly attached to the stairs. Fix any carpet that is loose or worn.  Avoid having throw rugs at the top or bottom of the stairs. If you do have throw rugs, attach them to the floor with carpet tape.  Make sure that you have a light switch at the top of the stairs and the bottom of the stairs. If you do not have them, ask someone to  add them for you. What else can I do to help prevent falls?  Wear shoes that:  Do not have high heels.  Have rubber bottoms.  Are comfortable and fit you well.  Are closed at the toe. Do not wear sandals.  If you use a stepladder:  Make sure that it is fully opened. Do not climb a closed stepladder.  Make sure that both sides of the stepladder are locked into place.  Ask someone to hold it for you, if possible.  Clearly mark and make sure that you can see:  Any grab bars or handrails.  First and last steps.  Where the edge of each step is.  Use tools that help you move around (mobility aids) if they are needed. These include:  Canes.  Walkers.  Scooters.  Crutches.  Turn on the lights when you go into a dark area. Replace any light bulbs as soon as they burn out.  Set up your furniture so you have a clear path. Avoid moving your furniture around.  If any of your floors are uneven, fix them.  If there are any pets around you, be aware of where they are.  Review your medicines with your doctor. Some medicines can make you feel dizzy. This can increase your chance of falling. Ask your doctor what other things that you can do to help prevent falls. This information is not intended to replace advice given to you by your health care provider. Make sure you discuss any questions you have with your health care provider. Document Released: 07/15/2009 Document Revised: 02/24/2016 Document Reviewed: 10/23/2014 Elsevier Interactive Patient Education  2017 [employer].

## 2020-08-06 NOTE — Addendum Note (Signed)
Addended by: Freddy Finner on: 08/06/2020 10:24 AM   Modules accepted: Orders
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
## 2020-08-12 ENCOUNTER — Encounter: Payer: Self-pay | Admitting: Family Medicine

## 2020-08-12 ENCOUNTER — Telehealth: Payer: BC Managed Care – PPO | Admitting: Family Medicine

## 2020-08-12 ENCOUNTER — Other Ambulatory Visit: Payer: Self-pay

## 2020-08-12 VITALS — BP 127/91 | Ht 64.0 in | Wt 208.0 lb

## 2020-08-12 DIAGNOSIS — F3341 Major depressive disorder, recurrent, in partial remission: Secondary | ICD-10-CM

## 2020-08-12 DIAGNOSIS — F5104 Psychophysiologic insomnia: Secondary | ICD-10-CM | POA: Diagnosis not present

## 2020-08-12 DIAGNOSIS — Z1159 Encounter for screening for other viral diseases: Secondary | ICD-10-CM

## 2020-08-12 DIAGNOSIS — R7303 Prediabetes: Secondary | ICD-10-CM

## 2020-08-12 DIAGNOSIS — I10 Essential (primary) hypertension: Secondary | ICD-10-CM | POA: Diagnosis not present

## 2020-08-12 DIAGNOSIS — E559 Vitamin D deficiency, unspecified: Secondary | ICD-10-CM

## 2020-08-12 DIAGNOSIS — E7800 Pure hypercholesterolemia, unspecified: Secondary | ICD-10-CM

## 2020-08-12 MED ORDER — PHENTERMINE HCL 37.5 MG PO TABS: ORAL_TABLET | ORAL | 1 refills | Status: DC

## 2020-08-12 NOTE — Patient Instructions (Addendum)
F/U in office with MD in 2nd week in February, call if you need me sooner  Please get fasting lipid, ccmp and eGFr, hBa1C , hepatitis C screen 1 week before next visit  New for weight loss is half phentermine daily  Thankful anxiety and  Depression are well controlled and better  Work through carpal tunnel issue  It is important that you exercise regularly at least 30 minutes 5 times a week. If you develop chest pain, have severe difficulty breathing, or feel very tired, stop exercising immediately and seek medical attention  Think about what you will eat, plan ahead. Choose " clean, green, fresh or frozen" over canned, processed or packaged foods which are more sugary, salty and fatty. 70 to 75% of food eaten should be vegetables and fruit. Three meals at set times with snacks allowed between meals, but they must be fruit or vegetables. Aim to eat over a 12 hour period , example 7 am to 7 pm, and STOP after  your last meal of the day. Drink water,generally about 64 ounces per day, no other drink is as healthy. Fruit juice is best enjoyed in a healthy way, by EATING the fruit.  Thanks for choosing [facility], we consider it a privelige to serve you.

## 2020-08-12 NOTE — Progress Notes (Signed)
Virtual Visit via Telephone Note  I connected with Brittany Maxwell on 08/12/20 at  3:40 PM EST by telephone and verified that I am speaking with the correct person using two identifiers.  Location: Patient: home Provider: office   I discussed the limitations, risks, security and privacy concerns of performing an evaluation and management service by telephone and the availability of in person appointments. I also discussed with the patient that there may be a patient responsible charge related to this service. The patient expressed understanding and agreed to proceed.   History of Present Illness: F/u depression and anxiety and other chronic problems including obesity Reports good response to antidepressant with no adverse side effects C/o weight gain despite efforts at weight loss has responded well in the past to phentermine Increased carpal tunnel symptoms bing currently managed by pain management/ neurology, will likely need surgery    Observations/Objective: BP (!) 127/91   Ht 5\' 4"  (1.626 m)   Wt 208 lb (94.3 kg)   BMI 35.70 kg/m  Good communication with no confusion and intact memory. Alert and oriented x 3 No signs of respiratory distress during speech    Assessment and Plan:  Major depression in partial remission (HCC) Marked improvement with good tolerance to mediation , continue same  Insomnia Good response to hydroxyzine , continue same  Morbid obesity (HCC)  Patient re-educated about  the importance of commitment to a  minimum of 150 minutes of exercise per week as able.  The importance of healthy food choices with portion control discussed, as well as eating regularly and within a 12 hour window most days. The need to choose "clean , green" food 50 to 75% of the time is discussed, as well as to make water the primary drink and set a goal of 64 ounces water daily.    Weight /BMI 08/12/2020 08/06/2020 06/01/2020  WEIGHT 208 lb 208 lb 203 lb  HEIGHT 5\' 4"  5\' 4"   5\' 4"   BMI 35.7 kg/m2 35.7 kg/m2 34.84 kg/m2  deteriorated, resume phentermine     Essential hypertension, benign Controlled, no change in medication DASH diet and commitment to daily physical activity for a minimum of 30 minutes discussed and encouraged, as a part of hypertension management. The importance of attaining a healthy weight is also discussed.  BP/Weight 08/12/2020 08/06/2020 06/01/2020 11/24/2019 08/04/2019 07/29/2019 06/04/2019  Systolic BP 127 - 124 145 118 11/26/2019 140  Diastolic BP 91 - 86 96 80 80 89  Wt. (Lbs) 208 208 203 186 195.2 199 195.04  BMI 35.7 35.7 34.84 31.93 33.51 34.16 33.48        Follow Up Instructions:    I discussed the assessment and treatment plan with the patient. The patient was provided an opportunity to ask questions and all were answered. The patient agreed with the plan and demonstrated an understanding of the instructions.   The patient was advised to call back or seek an in-person evaluation if the symptoms worsen or if the condition fails to improve as anticipated.  I provided 20 minutes of non-face-to-face time during this encounter.   13/11/2018, MD

## 2020-08-15 DIAGNOSIS — G4700 Insomnia, unspecified: Secondary | ICD-10-CM | POA: Insufficient documentation

## 2020-08-15 DIAGNOSIS — F324 Major depressive disorder, single episode, in partial remission: Secondary | ICD-10-CM | POA: Insufficient documentation

## 2020-08-15 NOTE — Assessment & Plan Note (Signed)
Good response to hydroxyzine , continue same

## 2020-08-15 NOTE — Assessment & Plan Note (Signed)
  Patient re-educated about  the importance of commitment to a  minimum of 150 minutes of exercise per week as able.  The importance of healthy food choices with portion control discussed, as well as eating regularly and within a 12 hour window most days. The need to choose "clean , green" food 50 to 75% of the time is discussed, as well as to make water the primary drink and set a goal of 64 ounces water daily.    Weight /BMI 08/12/2020 08/06/2020 06/01/2020  WEIGHT 208 lb 208 lb 203 lb  HEIGHT 5\' 4"  5\' 4"  5\' 4"   BMI 35.7 kg/m2 35.7 kg/m2 34.84 kg/m2  deteriorated, resume phentermine

## 2020-08-15 NOTE — Assessment & Plan Note (Signed)
Marked improvement with good tolerance to mediation , continue same

## 2020-08-15 NOTE — Assessment & Plan Note (Signed)
Controlled, no change in medication DASH diet and commitment to daily physical activity for a minimum of 30 minutes discussed and encouraged, as a part of hypertension management. The importance of attaining a healthy weight is also discussed.  BP/Weight 08/12/2020 08/06/2020 06/01/2020 11/24/2019 08/04/2019 07/29/2019 06/04/2019  Systolic BP 127 - 124 145 118 300 140  Diastolic BP 91 - 86 96 80 80 89  Wt. (Lbs) 208 208 203 186 195.2 199 195.04  BMI 35.7 35.7 34.84 31.93 33.51 34.16 33.48

## 2020-08-18 ENCOUNTER — Other Ambulatory Visit: Payer: Self-pay | Admitting: Family Medicine

## 2020-08-23 ENCOUNTER — Other Ambulatory Visit: Payer: Self-pay | Admitting: Family Medicine

## 2020-10-14 ENCOUNTER — Telehealth: Payer: Self-pay

## 2020-10-14 NOTE — Telephone Encounter (Signed)
LVM for the pt to call the office to schedule

## 2020-10-14 NOTE — Telephone Encounter (Signed)
-----   Message from Kerri Perches, MD sent at 10/14/2020 12:02 AM EST ----- Regarding: from d/c summary needs ov with me 2nd week in feb in offic and needs  fasting labs for visit already ordered Please schedule

## 2020-10-25 ENCOUNTER — Other Ambulatory Visit: Payer: Self-pay | Admitting: Family Medicine

## 2020-10-26 ENCOUNTER — Other Ambulatory Visit: Payer: Self-pay | Admitting: Family Medicine

## 2020-10-29 DIAGNOSIS — E7800 Pure hypercholesterolemia, unspecified: Secondary | ICD-10-CM | POA: Diagnosis not present

## 2020-10-29 DIAGNOSIS — R7303 Prediabetes: Secondary | ICD-10-CM | POA: Diagnosis not present

## 2020-10-29 DIAGNOSIS — G5602 Carpal tunnel syndrome, left upper limb: Secondary | ICD-10-CM | POA: Diagnosis not present

## 2020-10-29 DIAGNOSIS — M545 Low back pain, unspecified: Secondary | ICD-10-CM | POA: Diagnosis not present

## 2020-10-29 DIAGNOSIS — Z1159 Encounter for screening for other viral diseases: Secondary | ICD-10-CM | POA: Diagnosis not present

## 2020-10-29 DIAGNOSIS — G5601 Carpal tunnel syndrome, right upper limb: Secondary | ICD-10-CM | POA: Diagnosis not present

## 2020-10-29 DIAGNOSIS — I10 Essential (primary) hypertension: Secondary | ICD-10-CM | POA: Diagnosis not present

## 2020-10-30 LAB — LIPID PANEL
Chol/HDL Ratio: 3 ratio (ref 0.0–4.4)
Cholesterol, Total: 173 mg/dL (ref 100–199)
HDL: 57 mg/dL (ref >39)
LDL Chol Calc (NIH): 90 mg/dL (ref 0–99)
Triglycerides: 150 mg/dL — ABNORMAL HIGH (ref 0–149)
VLDL Cholesterol Cal: 26 mg/dL (ref 5–40)

## 2020-10-30 LAB — CMP14+EGFR
ALT: 18 IU/L (ref 0–32)
AST: 16 IU/L (ref 0–40)
Albumin/Globulin Ratio: 1.4 (ref 1.2–2.2)
Albumin: 4.5 g/dL (ref 3.8–4.9)
Alkaline Phosphatase: 91 IU/L (ref 44–121)
BUN/Creatinine Ratio: 6 — ABNORMAL LOW (ref 9–23)
BUN: 8 mg/dL (ref 6–24)
Bilirubin Total: 0.3 mg/dL (ref 0.0–1.2)
CO2: 26 mmol/L (ref 20–29)
Calcium: 9.8 mg/dL (ref 8.7–10.2)
Chloride: 100 mmol/L (ref 96–106)
Creatinine, Ser: 1.3 mg/dL — ABNORMAL HIGH (ref 0.57–1.00)
GFR calc Af Amer: 53 mL/min/{1.73_m2} — ABNORMAL LOW (ref >59)
GFR calc non Af Amer: 46 mL/min/{1.73_m2} — ABNORMAL LOW (ref >59)
Globulin, Total: 3.2 g/dL (ref 1.5–4.5)
Glucose: 111 mg/dL — ABNORMAL HIGH (ref 65–99)
Potassium: 3.4 mmol/L — ABNORMAL LOW (ref 3.5–5.2)
Sodium: 142 mmol/L (ref 134–144)
Total Protein: 7.7 g/dL (ref 6.0–8.5)

## 2020-10-30 LAB — HEMOGLOBIN A1C
Est. average glucose Bld gHb Est-mCnc: 137 mg/dL
Hgb A1c MFr Bld: 6.4 % — ABNORMAL HIGH (ref 4.8–5.6)

## 2020-10-30 LAB — HEPATITIS C ANTIBODY: Hep C Virus Ab: <0.1 s/co ratio (ref 0.0–0.9)

## 2020-11-09 ENCOUNTER — Telehealth (INDEPENDENT_AMBULATORY_CARE_PROVIDER_SITE_OTHER): Payer: BC Managed Care – PPO | Admitting: Family Medicine

## 2020-11-09 ENCOUNTER — Encounter: Payer: Self-pay | Admitting: Family Medicine

## 2020-11-09 ENCOUNTER — Other Ambulatory Visit: Payer: Self-pay

## 2020-11-09 VITALS — BP 126/92 | HR 77 | Resp 15 | Ht 64.0 in | Wt 198.0 lb

## 2020-11-09 DIAGNOSIS — E669 Obesity, unspecified: Secondary | ICD-10-CM | POA: Diagnosis not present

## 2020-11-09 DIAGNOSIS — F324 Major depressive disorder, single episode, in partial remission: Secondary | ICD-10-CM

## 2020-11-09 DIAGNOSIS — I10 Essential (primary) hypertension: Secondary | ICD-10-CM | POA: Diagnosis not present

## 2020-11-09 DIAGNOSIS — E785 Hyperlipidemia, unspecified: Secondary | ICD-10-CM

## 2020-11-09 DIAGNOSIS — R7303 Prediabetes: Secondary | ICD-10-CM

## 2020-11-09 DIAGNOSIS — Z1231 Encounter for screening mammogram for malignant neoplasm of breast: Secondary | ICD-10-CM

## 2020-11-09 MED ORDER — PHENTERMINE HCL 37.5 MG PO TABS: ORAL_TABLET | ORAL | 0 refills | Status: DC

## 2020-11-09 NOTE — Progress Notes (Signed)
Virtual Visit via Telephone Note  I connected with Brittany Maxwell on 11/09/20 at  8:00 AM EST by telephone and verified that I am speaking with the correct person using two identifiers.  Location: Patient: home Provider: work   I discussed the limitations, risks, security and privacy concerns of performing an evaluation and management service by telephone and the availability of in person appointments. I also discussed with the patient that there may be a patient responsible charge related to this service. The patient expressed understanding and agreed to proceed.   History of Present Illness: F/U chronic problems and address any new or current concerns. Review and update medications and allergies. Review recent lab and radiologic data . Update routine health maintainace. Review an encourage improved health habits to include nutrition, exercise and  sleep .  Denies recent fever or chills. Denies sinus pressure, nasal congestion, ear pain or sore throat. Denies chest congestion, productive cough or wheezing. Denies chest pains, palpitations and leg swelling Denies abdominal pain, nausea, vomiting,diarrhea or constipation.   Denies dysuria, frequency, hesitancy or incontinence. Denies uncontrolled  joint pain, swelling and limitation in mobility. Denies headaches, seizures, numbness, or tingling. Denies depression, anxiety or insomnia. Denies skin break down or rash.       Observations/Objective: BP (!) 126/92   Pulse 77   Resp 15   Ht 5\' 4"  (1.626 m)   Wt 198 lb (89.8 kg)   BMI 33.99 kg/m  Good communication with no confusion and intact memory. Alert and oriented x 3 No signs of respiratory distress during speech    Assessment and Plan: Obesity (BMI 30.0-34.9) Improved, continue same management Patient re-educated about  the importance of commitment to a  minimum of 150 minutes of exercise per week as able.  The importance of healthy food choices with portion  control discussed, as well as eating regularly and within a 12 hour window most days. The need to choose "clean , green" food 50 to 75% of the time is discussed, as well as to make water the primary drink and set a goal of 64 ounces water daily.    Weight /BMI 11/09/2020 08/12/2020 08/06/2020  WEIGHT 198 lb 208 lb 208 lb  HEIGHT 5\' 4"  5\' 4"  5\' 4"   BMI 33.99 kg/m2 35.7 kg/m2 35.7 kg/m2      Essential hypertension, benign DASH diet and commitment to daily physical activity for a minimum of 30 minutes discussed and encouraged, as a part of hypertension management. The importance of attaining a healthy weight is also discussed. Elevated diastolic needs to be lower, weight loss and dietary management at this time  BP/Weight 11/09/2020 08/12/2020 08/06/2020 06/01/2020 11/24/2019 08/04/2019 07/29/2019  Systolic BP 126 127 - 124 145 06/03/2020 118  Diastolic BP 92 91 - 86 96 80 80  Wt. (Lbs) 198 208 208 203 186 195.2 199  BMI 33.99 35.7 35.7 34.84 31.93 33.51 34.16       Prediabetes Deteriorated, ned to reduce carb and inc execise Patient educated about the importance of limiting  Carbohydrate intake , the need to commit to daily physical activity for a minimum of 30 minutes , and to commit weight loss. The fact that changes in all these areas will reduce or eliminate all together the development of diabetes is stressed.   Diabetic Labs Latest Ref Rng & Units 10/29/2020 06/01/2020 11/25/2019 03/25/2019 09/12/2018  HbA1c 4.8 - 5.6 % 6.4(H) 6.3(A) 6.2(H) 6.2(H) -  Microalbumin Not Estab. ug/mL - - - - 45.2(H)  Micro/Creat Ratio 0.0 - 30.0 mg/g creat - - - - 26.5  Chol 100 - 199 mg/dL 287 - 681 157 -  HDL >26 mg/dL 57 - 54 62 -  Calc LDL 0 - 99 mg/dL 90 - 95 85 -  Triglycerides 0 - 149 mg/dL 203(T) - 76 87 -  Creatinine 0.57 - 1.00 mg/dL 5.97(C) 1.63 8.45(X) 6.46(O) -   BP/Weight 11/09/2020 08/12/2020 08/06/2020 06/01/2020 11/24/2019 08/04/2019 07/29/2019  Systolic BP 126 127 - 124 145 032 118  Diastolic BP  92 91 - 86 96 80 80  Wt. (Lbs) 198 208 208 203 186 195.2 199  BMI 33.99 35.7 35.7 34.84 31.93 33.51 34.16   Foot/eye exam completion dates Latest Ref Rng & Units 03/20/2018 02/21/2017  Eye Exam No Retinopathy - No Retinopathy  Foot Form Completion - Done -      Major depression in partial remission (HCC) Controlled, no change in medication   Hyperlipidemia LDL goal <100 Hyperlipidemia:Low fat diet discussed and encouraged.   Lipid Panel  Lab Results  Component Value Date   CHOL 173 10/29/2020   HDL 57 10/29/2020   LDLCALC 90 10/29/2020   TRIG 150 (H) 10/29/2020   CHOLHDL 3.0 10/29/2020   Needs to lower fat intake       Follow Up Instructions:    I discussed the assessment and treatment plan with the patient. The patient was provided an opportunity to ask questions and all were answered. The patient agreed with the plan and demonstrated an understanding of the instructions.   The patient was advised to call back or seek an in-person evaluation if the symptoms worsen or if the condition fails to improve as anticipated.  I provided 22 minutes of non-face-to-face time during this encounter.   Syliva Overman, MD

## 2020-11-09 NOTE — Patient Instructions (Addendum)
F/U in 4 months, call if you need me sooner  Non fast chem 7 and EGFR  Week of March 28  Please schedule mammogram at checkout.  Congrats on weight loss , keep it up, goal of 10 pounds in the next 4 months  Limit calories to  1500 cal/ day  Drink 64 ounces water daily  Continue HALF phentermine tablet once daily  It is important that you exercise regularly at least 30 minutes 5 times a week. If you develop chest pain, have severe difficulty breathing, or feel very tired, stop exercising immediately and seek medical attention     Calorie Counting for Weight Loss Calories are units of energy. Your body needs a certain number of calories from food to keep going throughout the day. When you eat or drink more calories than your body needs, your body stores the extra calories mostly as fat. When you eat or drink fewer calories than your body needs, your body burns fat to get the energy it needs. Calorie counting means keeping track of how many calories you eat and drink each day. Calorie counting can be helpful if you need to lose weight. If you eat fewer calories than your body needs, you should lose weight. Ask your health care provider what a healthy weight is for you. For calorie counting to work, you will need to eat the right number of calories each day to lose a healthy amount of weight per week. A dietitian can help you figure out how many calories you need in a day and will suggest ways to reach your calorie goal.  A healthy amount of weight to lose each week is usually 1-2 lb (0.5-0.9 kg). This usually means that your daily calorie intake should be reduced by 500-750 calories.  Eating 1,200-1,500 calories a day can help most women lose weight.  Eating 1,500-1,800 calories a day can help most men lose weight. What do I need to know about calorie counting? Work with your health care provider or dietitian to determine how many calories you should get each day. To meet your daily calorie  goal, you will need to:  Find out how many calories are in each food that you would like to eat. Try to do this before you eat.  Decide how much of the food you plan to eat.  Keep a food log. Do this by writing down what you ate and how many calories it had. To successfully lose weight, it is important to balance calorie counting with a healthy lifestyle that includes regular activity. Where do I find calorie information? The number of calories in a food can be found on a Nutrition Facts label. If a food does not have a Nutrition Facts label, try to look up the calories online or ask your dietitian for help. Remember that calories are listed per serving. If you choose to have more than one serving of a food, you will have to multiply the calories per serving by the number of servings you plan to eat. For example, the label on a package of bread might say that a serving size is 1 slice and that there are 90 calories in a serving. If you eat 1 slice, you will have eaten 90 calories. If you eat 2 slices, you will have eaten 180 calories.   How do I keep a food log? After each time that you eat, record the following in your food log as soon as possible:  What you ate. Be  sure to include toppings, sauces, and other extras on the food.  How much you ate. This can be measured in cups, ounces, or number of items.  How many calories were in each food and drink.  The total number of calories in the food you ate. Keep your food log near you, such as in a pocket-sized notebook or on an app or website on your mobile phone. Some programs will calculate calories for you and show you how many calories you have left to meet your daily goal. What are some portion-control tips?  Know how many calories are in a serving. This will help you know how many servings you can have of a certain food.  Use a measuring cup to measure serving sizes. You could also try weighing out portions on a kitchen scale. With time,  you will be able to estimate serving sizes for some foods.  Take time to put servings of different foods on your favorite plates or in your favorite bowls and cups so you know what a serving looks like.  Try not to eat straight from a food's packaging, such as from a bag or box. Eating straight from the package makes it hard to see how much you are eating and can lead to overeating. Put the amount you would like to eat in a cup or on a plate to make sure you are eating the right portion.  Use smaller plates, glasses, and bowls for smaller portions and to prevent overeating.  Try not to multitask. For example, avoid watching TV or using your computer while eating. If it is time to eat, sit down at a table and enjoy your food. This will help you recognize when you are full. It will also help you be more mindful of what and how much you are eating. What are tips for following this plan? Reading food labels  Check the calorie count compared with the serving size. The serving size may be smaller than what you are used to eating.  Check the source of the calories. Try to choose foods that are high in protein, fiber, and vitamins, and low in saturated fat, trans fat, and sodium. Shopping  Read nutrition labels while you shop. This will help you make healthy decisions about which foods to buy.  Pay attention to nutrition labels for low-fat or fat-free foods. These foods sometimes have the same number of calories or more calories than the full-fat versions. They also often have added sugar, starch, or salt to make up for flavor that was removed with the fat.  Make a grocery list of lower-calorie foods and stick to it. Cooking  Try to cook your favorite foods in a healthier way. For example, try baking instead of frying.  Use low-fat dairy products. Meal planning  Use more fruits and vegetables. One-half of your plate should be fruits and vegetables.  Include lean proteins, such as chicken,  [place], and fish. Lifestyle Each week, aim to do one of the following:  150 minutes of moderate exercise, such as walking.  75 minutes of vigorous exercise, such as running. General information  Know how many calories are in the foods you eat most often. This will help you calculate calorie counts faster.  Find a way of tracking calories that works for you. Get creative. Try different apps or programs if writing down calories does not work for you. What foods should I eat?  Eat nutritious foods. It is better to have a nutritious, high-calorie  food, such as an avocado, than a food with few nutrients, such as a bag of potato chips.  Use your calories on foods and drinks that will fill you up and will not leave you hungry soon after eating. ? Examples of foods that fill you up are nuts and nut butters, vegetables, lean proteins, and high-fiber foods such as whole grains. High-fiber foods are foods with more than 5 g of fiber per serving.  Pay attention to calories in drinks. Low-calorie drinks include water and unsweetened drinks. The items listed above may not be a complete list of foods and beverages you can eat. Contact a dietitian for more information.   What foods should I limit? Limit foods or drinks that are not good sources of vitamins, minerals, or protein or that are high in unhealthy fats. These include:  Candy.  Other sweets.  Sodas, specialty coffee drinks, alcohol, and juice. The items listed above may not be a complete list of foods and beverages you should avoid. Contact a dietitian for more information. How do I count calories when eating out?  Pay attention to portions. Often, portions are much larger when eating out. Try these tips to keep portions smaller: ? Consider sharing a meal instead of getting your own. ? If you get your own meal, eat only half of it. Before you start eating, ask for a container and put half of your meal into it. ? When available, consider  ordering smaller portions from the menu instead of full portions.  Pay attention to your food and drink choices. Knowing the way food is cooked and what is included with the meal can help you eat fewer calories. ? If calories are listed on the menu, choose the lower-calorie options. ? Choose dishes that include vegetables, fruits, whole grains, low-fat dairy products, and lean proteins. ? Choose items that are boiled, broiled, grilled, or steamed. Avoid items that are buttered, battered, fried, or served with cream sauce. Items labeled as crispy are usually fried, unless stated otherwise. ? Choose water, low-fat milk, unsweetened iced tea, or other drinks without added sugar. If you want an alcoholic beverage, choose a lower-calorie option, such as a glass of wine or light beer. ? Ask for dressings, sauces, and syrups on the side. These are usually high in calories, so you should limit the amount you eat. ? If you want a salad, choose a garden salad and ask for grilled meats. Avoid extra toppings such as bacon, cheese, or fried items. Ask for the dressing on the side, or ask for olive oil and vinegar or lemon to use as dressing.  Estimate how many servings of a food you are given. Knowing serving sizes will help you be aware of how much food you are eating at restaurants. Where to find more information  Centers for Disease Control and Prevention: http://www.wolf.info/  [place] Department of Agriculture: http://www.wilson-mendoza.org/ Summary  Calorie counting means keeping track of how many calories you eat and drink each day. If you eat fewer calories than your body needs, you should lose weight.  A healthy amount of weight to lose per week is usually 1-2 lb (0.5-0.9 kg). This usually means reducing your daily calorie intake by 500-750 calories.  The number of calories in a food can be found on a Nutrition Facts label. If a food does not have a Nutrition Facts label, try to look up the calories online or ask your dietitian  for help.  Use smaller plates, glasses, and bowls  for smaller portions and to prevent overeating.  Use your calories on foods and drinks that will fill you up and not leave you hungry shortly after a meal. This information is not intended to replace advice given to you by your health care provider. Make sure you discuss any questions you have with your health care provider. Document Revised: 10/30/2019 Document Reviewed: 10/30/2019 Elsevier Patient Education  2021 [employer].

## 2020-11-13 ENCOUNTER — Encounter: Payer: Self-pay | Admitting: Family Medicine

## 2020-11-13 ENCOUNTER — Telehealth: Payer: Self-pay | Admitting: Family Medicine

## 2020-11-13 MED ORDER — FLUOXETINE HCL 20 MG PO TABS: 20.0000 mg | ORAL_TABLET | Freq: Every day | ORAL | 1 refills | Status: DC

## 2020-11-13 NOTE — Assessment & Plan Note (Signed)
Deteriorated, ned to reduce carb and inc execise Patient educated about the importance of limiting  Carbohydrate intake , the need to commit to daily physical activity for a minimum of 30 minutes , and to commit weight loss. The fact that changes in all these areas will reduce or eliminate all together the development of diabetes is stressed.   Diabetic Labs Latest Ref Rng & Units 10/29/2020 06/01/2020 11/25/2019 03/25/2019 09/12/2018  HbA1c 4.8 - 5.6 % 6.4(H) 6.3(A) 6.2(H) 6.2(H) -  Microalbumin Not Estab. ug/mL - - - - 45.2(H)  Micro/Creat Ratio 0.0 - 30.0 mg/g creat - - - - 26.5  Chol 100 - 199 mg/dL 188 - 416 606 -  HDL >30 mg/dL 57 - 54 62 -  Calc LDL 0 - 99 mg/dL 90 - 95 85 -  Triglycerides 0 - 149 mg/dL 160(F) - 76 87 -  Creatinine 0.57 - 1.00 mg/dL 0.93(A) 3.55 7.32(K) 0.25(K) -   BP/Weight 11/09/2020 08/12/2020 08/06/2020 06/01/2020 11/24/2019 08/04/2019 07/29/2019  Systolic BP 126 127 - 124 145 270 118  Diastolic BP 92 91 - 86 96 80 80  Wt. (Lbs) 198 208 208 203 186 195.2 199  BMI 33.99 35.7 35.7 34.84 31.93 33.51 34.16   Foot/eye exam completion dates Latest Ref Rng & Units 03/20/2018 02/21/2017  Eye Exam No Retinopathy - No Retinopathy  Foot Form Completion - Done -

## 2020-11-13 NOTE — Telephone Encounter (Signed)
Pls call pt and  inquire how she takes the hydroxyzine 25 mg tab, is it  1 or 2 at bedtime and is it every night, I need to send in a new script, if taking 2 let her know new script will be for 50 mg one at bedtime, I will send once I get your response Also please add vit D level to upcoming lab, thanks

## 2020-11-13 NOTE — Assessment & Plan Note (Addendum)
Improved, continue same management Patient re-educated about  the importance of commitment to a  minimum of 150 minutes of exercise per week as able.  The importance of healthy food choices with portion control discussed, as well as eating regularly and within a 12 hour window most days. The need to choose "clean , green" food 50 to 75% of the time is discussed, as well as to make water the primary drink and set a goal of 64 ounces water daily.    Weight /BMI 11/09/2020 08/12/2020 08/06/2020  WEIGHT 198 lb 208 lb 208 lb  HEIGHT 5\' 4"  5\' 4"  5\' 4"   BMI 33.99 kg/m2 35.7 kg/m2 35.7 kg/m2

## 2020-11-13 NOTE — Assessment & Plan Note (Addendum)
DASH diet and commitment to daily physical activity for a minimum of 30 minutes discussed and encouraged, as a part of hypertension management. The importance of attaining a healthy weight is also discussed. Elevated diastolic needs to be lower, weight loss and dietary management at this time  BP/Weight 11/09/2020 08/12/2020 08/06/2020 06/01/2020 11/24/2019 08/04/2019 07/29/2019  Systolic BP 126 127 - 124 145 616 118  Diastolic BP 92 91 - 86 96 80 80  Wt. (Lbs) 198 208 208 203 186 195.2 199  BMI 33.99 35.7 35.7 34.84 31.93 33.51 34.16

## 2020-11-13 NOTE — Assessment & Plan Note (Signed)
Controlled, no change in medication  

## 2020-11-13 NOTE — Assessment & Plan Note (Signed)
Hyperlipidemia:Low fat diet discussed and encouraged.   Lipid Panel  Lab Results  Component Value Date   CHOL 173 10/29/2020   HDL 57 10/29/2020   LDLCALC 90 10/29/2020   TRIG 150 (H) 10/29/2020   CHOLHDL 3.0 10/29/2020   Needs to lower fat intake

## 2020-11-15 ENCOUNTER — Other Ambulatory Visit: Payer: Self-pay | Admitting: Family Medicine

## 2020-11-15 ENCOUNTER — Ambulatory Visit (HOSPITAL_COMMUNITY): Payer: BC Managed Care – PPO

## 2020-11-15 ENCOUNTER — Other Ambulatory Visit: Payer: Self-pay

## 2020-11-15 DIAGNOSIS — E669 Obesity, unspecified: Secondary | ICD-10-CM

## 2020-11-15 DIAGNOSIS — E559 Vitamin D deficiency, unspecified: Secondary | ICD-10-CM

## 2020-11-15 MED ORDER — HYDROXYZINE HCL 50 MG PO TABS: ORAL_TABLET | ORAL | 4 refills | Status: DC

## 2020-11-15 NOTE — Telephone Encounter (Signed)
She does take 2 and is aware that you will send in the 50mg  dose increase

## 2020-11-17 ENCOUNTER — Ambulatory Visit (HOSPITAL_COMMUNITY): Payer: BC Managed Care – PPO

## 2020-11-17 ENCOUNTER — Other Ambulatory Visit: Payer: Self-pay | Admitting: Family Medicine

## 2020-12-01 ENCOUNTER — Other Ambulatory Visit: Payer: Self-pay | Admitting: Family Medicine

## 2021-03-02 DIAGNOSIS — G5601 Carpal tunnel syndrome, right upper limb: Secondary | ICD-10-CM | POA: Diagnosis not present

## 2021-03-02 DIAGNOSIS — M545 Low back pain, unspecified: Secondary | ICD-10-CM | POA: Diagnosis not present

## 2021-03-02 DIAGNOSIS — G5602 Carpal tunnel syndrome, left upper limb: Secondary | ICD-10-CM | POA: Diagnosis not present

## 2021-03-02 DIAGNOSIS — M5416 Radiculopathy, lumbar region: Secondary | ICD-10-CM | POA: Diagnosis not present

## 2021-03-08 DIAGNOSIS — E669 Obesity, unspecified: Secondary | ICD-10-CM | POA: Diagnosis not present

## 2021-03-08 DIAGNOSIS — E559 Vitamin D deficiency, unspecified: Secondary | ICD-10-CM | POA: Diagnosis not present

## 2021-03-09 ENCOUNTER — Ambulatory Visit: Payer: BC Managed Care – PPO | Admitting: Family Medicine

## 2021-03-09 ENCOUNTER — Other Ambulatory Visit: Payer: Self-pay

## 2021-03-09 ENCOUNTER — Encounter: Payer: Self-pay | Admitting: Family Medicine

## 2021-03-09 VITALS — BP 124/84 | HR 82 | Temp 98.0°F | Resp 16 | Ht 63.0 in | Wt 207.1 lb

## 2021-03-09 DIAGNOSIS — R7303 Prediabetes: Secondary | ICD-10-CM

## 2021-03-09 DIAGNOSIS — E669 Obesity, unspecified: Secondary | ICD-10-CM

## 2021-03-09 DIAGNOSIS — J0100 Acute maxillary sinusitis, unspecified: Secondary | ICD-10-CM

## 2021-03-09 DIAGNOSIS — F324 Major depressive disorder, single episode, in partial remission: Secondary | ICD-10-CM | POA: Diagnosis not present

## 2021-03-09 DIAGNOSIS — I10 Essential (primary) hypertension: Secondary | ICD-10-CM | POA: Diagnosis not present

## 2021-03-09 LAB — BMP8+EGFR
BUN/Creatinine Ratio: 12 (ref 9–23)
BUN: 13 mg/dL (ref 6–24)
CO2: 27 mmol/L (ref 20–29)
Calcium: 9.5 mg/dL (ref 8.7–10.2)
Chloride: 99 mmol/L (ref 96–106)
Creatinine, Ser: 1.08 mg/dL — ABNORMAL HIGH (ref 0.57–1.00)
Glucose: 127 mg/dL — ABNORMAL HIGH (ref 65–99)
Potassium: 3.4 mmol/L — ABNORMAL LOW (ref 3.5–5.2)
Sodium: 142 mmol/L (ref 134–144)
eGFR: 61 mL/min/{1.73_m2} (ref >59)

## 2021-03-09 LAB — POCT GLYCOSYLATED HEMOGLOBIN (HGB A1C): HbA1c, POC (controlled diabetic range): 6.7 % (ref 0.0–7.0)

## 2021-03-09 LAB — VITAMIN D 25 HYDROXY (VIT D DEFICIENCY, FRACTURES): Vit D, 25-Hydroxy: 44.3 ng/mL (ref 30.0–100.0)

## 2021-03-09 MED ORDER — FLUOXETINE HCL 20 MG PO TABS: 20.0000 mg | ORAL_TABLET | Freq: Every day | ORAL | 1 refills | Status: DC

## 2021-03-09 MED ORDER — AZITHROMYCIN 250 MG PO TABS: ORAL_TABLET | ORAL | 0 refills | Status: AC

## 2021-03-09 MED ORDER — LORATADINE 10 MG PO TABS: 10.0000 mg | ORAL_TABLET | Freq: Every day | ORAL | 1 refills | Status: AC

## 2021-03-09 MED ORDER — HYDROXYZINE HCL 50 MG PO TABS: ORAL_TABLET | ORAL | 4 refills | Status: DC

## 2021-03-09 MED ORDER — AMLODIPINE BESYLATE 10 MG PO TABS: 1.0000 | ORAL_TABLET | Freq: Every day | ORAL | 1 refills | Status: DC

## 2021-03-09 MED ORDER — PRAVASTATIN SODIUM 40 MG PO TABS: 40.0000 mg | ORAL_TABLET | Freq: Every day | ORAL | 1 refills | Status: DC

## 2021-03-09 MED ORDER — TRIAMTERENE-HCTZ 75-50 MG PO TABS: 1.0000 | ORAL_TABLET | Freq: Every day | ORAL | 1 refills | Status: DC

## 2021-03-09 MED ORDER — VITAMIN D (ERGOCALCIFEROL) 1.25 MG (50000 UNIT) PO CAPS: 1.0000 | ORAL_CAPSULE | ORAL | 1 refills | Status: DC

## 2021-03-09 MED ORDER — PHENTERMINE HCL 37.5 MG PO TABS: ORAL_TABLET | ORAL | 1 refills | Status: DC

## 2021-03-09 MED ORDER — CLONIDINE HCL 0.1 MG PO TABS: ORAL_TABLET | ORAL | 1 refills | Status: DC

## 2021-03-09 MED ORDER — METFORMIN HCL 500 MG PO TABS: 1.0000 | ORAL_TABLET | Freq: Every day | ORAL | 0 refills | Status: DC

## 2021-03-09 NOTE — Assessment & Plan Note (Signed)
Controlled, no change in medication  

## 2021-03-09 NOTE — Assessment & Plan Note (Signed)
Controlled, no change in medication DASH diet and commitment to daily physical activity for a minimum of 30 minutes discussed and encouraged, as a part of hypertension management. The importance of attaining a healthy weight is also discussed.  BP/Weight 03/09/2021 11/09/2020 08/12/2020 08/06/2020 06/01/2020 11/24/2019 08/04/2019  Systolic BP 124 126 127 - 124 349 118  Diastolic BP 84 92 91 - 86 96 80  Wt. (Lbs) 207.12 198 208 208 203 186 195.2  BMI 36.69 33.99 35.7 35.7 34.84 31.93 33.51

## 2021-03-09 NOTE — Patient Instructions (Addendum)
Annual exam with pap end August, call if you need me sooner  Z pack prescribed for sinusitis  Start half phentermine daily  It is important that you exercise regularly at least 30 minutes 5 times a week. If you develop chest pain, have severe difficulty breathing, or feel very tired, stop exercising immediately and seek medical attention   Start OTC vit D3 , 1000 IU once daily  GlycoHb in office today  Nurse please highlight potassium rich food info Hypokalemia Hypokalemia means that the amount of potassium in the blood is lower than normal. Potassium is a chemical (electrolyte) that helps regulate the amount of fluid in the body. It also stimulates muscle tightening (contraction) and helps nerves work properly. Normally, most of the body's potassium is inside cells, and only a very small amount is in the blood. Because the amount in the blood is so small, minor changes to potassium levels in the blood can be life-threatening. What are the causes? This condition may be caused by:  Antibiotic medicine.  Diarrhea or vomiting. Taking too much of a medicine that helps you have a bowel movement (laxative) can cause diarrhea and lead to hypokalemia.  Chronic kidney disease (CKD).  Medicines that help the body get rid of excess fluid (diuretics).  Eating disorders, such as bulimia.  Low magnesium levels in the body.  Sweating a lot. What are the signs or symptoms? Symptoms of this condition include:  Weakness.  Constipation.  Fatigue.  Muscle cramps.  Mental confusion.  Skipped heartbeats or irregular heartbeat (palpitations).  Tingling or numbness. How is this diagnosed? This condition is diagnosed with a blood test. How is this treated? This condition may be treated by:  Taking potassium supplements by mouth.  Adjusting the medicines that you take.  Eating more foods that contain a lot of potassium. If your potassium level is very low, you may need to get  potassium through an IV and be monitored in the hospital. Follow these instructions at home:  Take over-the-counter and prescription medicines only as told by your health care provider. This includes vitamins and supplements.  Eat a healthy diet. A healthy diet includes fresh fruits and vegetables, whole grains, healthy fats, and lean proteins.  If instructed, eat more foods that contain a lot of potassium. This includes: ? Nuts, such as peanuts and pistachios. ? Seeds, such as sunflower seeds and pumpkin seeds. ? Peas, lentils, and lima beans. ? Whole grain and bran cereals and breads. ? Fresh fruits and vegetables, such as apricots, avocado, bananas, cantaloupe, kiwi, oranges, tomatoes, asparagus, and potatoes. ? Orange juice. ? Tomato juice. ? Red meats. ? Yogurt.  Keep all follow-up visits as told by your health care provider. This is important.   Contact a health care provider if you:  Have weakness that gets worse.  Feel your heart pounding or racing.  Vomit.  Have diarrhea.  Have diabetes (diabetes mellitus) and you have trouble keeping your blood sugar (glucose) in your target range. Get help right away if you:  Have chest pain.  Have shortness of breath.  Have vomiting or diarrhea that lasts for more than 2 days.  Faint. Summary  Hypokalemia means that the amount of potassium in the blood is lower than normal.  This condition is diagnosed with a blood test.  Hypokalemia may be treated by taking potassium supplements, adjusting the medicines that you take, or eating more foods that are high in potassium.  If your potassium level is very low, you  may need to get potassium through an IV and be monitored in the hospital. This information is not intended to replace advice given to you by your health care provider. Make sure you discuss any questions you have with your health care provider. Document Revised: 05/01/2018 Document Reviewed: 05/01/2018 Elsevier  Patient Education  2021 [employer].

## 2021-03-09 NOTE — Assessment & Plan Note (Signed)
Z pack prescribed 

## 2021-03-09 NOTE — Assessment & Plan Note (Signed)
Deteriorated  Patient re-educated about  the importance of commitment to a  minimum of 150 minutes of exercise per week as able.  The importance of healthy food choices with portion control discussed, as well as eating regularly and within a 12 hour window most days. The need to choose "clean , green" food 50 to 75% of the time is discussed, as well as to make water the primary drink and set a goal of 64 ounces water daily.    Weight /BMI 03/09/2021 11/09/2020 08/12/2020  WEIGHT 207 lb 1.9 oz 198 lb 208 lb  HEIGHT 5\' 3"  5\' 4"  5\' 4"   BMI 36.69 kg/m2 33.99 kg/m2 35.7 kg/m2

## 2021-03-09 NOTE — Progress Notes (Signed)
Brittany Maxwell     MRN: [id number]      DOB: 1965/12/17   HPI Brittany Maxwell is here for follow up and re-evaluation of chronic medical conditions, medication management and review of any available recent lab and radiology data.  Preventive health is updated, specifically  Cancer screening and Immunization.   Questions or concerns regarding consultations or procedures which the PT has had in the interim are  addressed. The PT denies any adverse reactions to current medications since the last visit.  3 day h/o sinus pressure with PND which is  yellow and thick. C/o sore throat, no fever or chills , no chest congestion  C/o weight gain , wants to resume phentermine  ROS  Denies chest congestion, productive cough or wheezing. Denies chest pains, palpitations and leg swelling Denies abdominal pain, nausea, vomiting,diarrhea or constipation.   Denies dysuria, frequency, hesitancy or incontinence. Denies joint pain, swelling and limitation in mobility. Denies headaches, seizures, numbness, or tingling. Denies depression, anxiety or insomnia. Denies skin break down or rash.   PE  BP 124/84   Pulse 82   Temp 98 F (36.7 C) (Temporal)   Resp 16   Ht 5\' 3"  (1.6 m)   Wt 207 lb 1.9 oz (93.9 kg)   SpO2 95%   BMI 36.69 kg/m   Patient alert and oriented and in no cardiopulmonary distress.  HEENT: No facial asymmetry, EOMI,     Neck supple .Maxillary sinus tenderness, TM clear, oropharynx no erythema or exudate  Chest: Clear to auscultation bilaterally.  CVS: S1, S2 no murmurs, no S3.Regular rate.  ABD: Soft non tender.   Ext: No edema  MS: Adequate ROM spine, shoulders, hips and knees.  Skin: Intact, no ulcerations or rash noted.  Psych: Good eye contact, normal affect. Memory intact not anxious or depressed appearing.  CNS: CN 2-12 intact, power,  normal throughout.no focal deficits noted.   Assessment & Plan  Acute sinusitis Z pack prescribed  Essential  hypertension, benign Controlled, no change in medication DASH diet and commitment to daily physical activity for a minimum of 30 minutes discussed and encouraged, as a part of hypertension management. The importance of attaining a healthy weight is also discussed.  BP/Weight 03/09/2021 11/09/2020 08/12/2020 08/06/2020 06/01/2020 11/24/2019 08/04/2019  Systolic BP 124 126 127 - 124 13/11/2018 118  Diastolic BP 84 92 91 - 86 96 80  Wt. (Lbs) 207.12 198 208 208 203 186 195.2  BMI 36.69 33.99 35.7 35.7 34.84 31.93 33.51       Major depression in partial remission (HCC) Controlled, no change in medication   Obesity (BMI 30.0-34.9) Deteriorated  Patient re-educated about  the importance of commitment to a  minimum of 150 minutes of exercise per week as able.  The importance of healthy food choices with portion control discussed, as well as eating regularly and within a 12 hour window most days. The need to choose "clean , green" food 50 to 75% of the time is discussed, as well as to make water the primary drink and set a goal of 64 ounces water daily.    Weight /BMI 03/09/2021 11/09/2020 08/12/2020  WEIGHT 207 lb 1.9 oz 198 lb 208 lb  HEIGHT 5\' 3"  5\' 4"  5\' 4"   BMI 36.69 kg/m2 33.99 kg/m2 35.7 kg/m2      Prediabetes Patient educated about the importance of limiting  Carbohydrate intake , the need to commit to daily physical activity for a minimum of 30 minutes , and  to commit weight loss. The fact that changes in all these areas will reduce or eliminate all together the development of diabetes is stressed.  deteriorated  Diabetic Labs Latest Ref Rng & Units 03/09/2021 03/08/2021 10/29/2020 06/01/2020 11/25/2019  HbA1c 0.0 - 7.0 % 6.7 - 6.4(H) 6.3(A) 6.2(H)  Microalbumin Not Estab. ug/mL - - - - -  Micro/Creat Ratio 0.0 - 30.0 mg/g creat - - - - -  Chol 100 - 199 mg/dL - - 712 - 458  HDL >09 mg/dL - - 57 - 54  Calc LDL 0 - 99 mg/dL - - 90 - 95  Triglycerides 0 - 149 mg/dL - - 983(J) - 76  Creatinine  0.57 - 1.00 mg/dL - 8.25(K) 5.39(J) 6.73 1.15(H)   BP/Weight 03/09/2021 11/09/2020 08/12/2020 08/06/2020 06/01/2020 11/24/2019 08/04/2019  Systolic BP 124 126 127 - 124 419 118  Diastolic BP 84 92 91 - 86 96 80  Wt. (Lbs) 207.12 198 208 208 203 186 195.2  BMI 36.69 33.99 35.7 35.7 34.84 31.93 33.51   Foot/eye exam completion dates Latest Ref Rng & Units 03/20/2018 02/21/2017  Eye Exam No Retinopathy - No Retinopathy  Foot Form Completion - Done -

## 2021-03-09 NOTE — Assessment & Plan Note (Signed)
Patient educated about the importance of limiting  Carbohydrate intake , the need to commit to daily physical activity for a minimum of 30 minutes , and to commit weight loss. The fact that changes in all these areas will reduce or eliminate all together the development of diabetes is stressed.  deteriorated  Diabetic Labs Latest Ref Rng & Units 03/09/2021 03/08/2021 10/29/2020 06/01/2020 11/25/2019  HbA1c 0.0 - 7.0 % 6.7 - 6.4(H) 6.3(A) 6.2(H)  Microalbumin Not Estab. ug/mL - - - - -  Micro/Creat Ratio 0.0 - 30.0 mg/g creat - - - - -  Chol 100 - 199 mg/dL - - 220 - 254  HDL >27 mg/dL - - 57 - 54  Calc LDL 0 - 99 mg/dL - - 90 - 95  Triglycerides 0 - 149 mg/dL - - 062(B) - 76  Creatinine 0.57 - 1.00 mg/dL - 7.62(G) 3.15(V) 7.61 1.15(H)   BP/Weight 03/09/2021 11/09/2020 08/12/2020 08/06/2020 06/01/2020 11/24/2019 08/04/2019  Systolic BP 124 126 127 - 124 607 118  Diastolic BP 84 92 91 - 86 96 80  Wt. (Lbs) 207.12 198 208 208 203 186 195.2  BMI 36.69 33.99 35.7 35.7 34.84 31.93 33.51   Foot/eye exam completion dates Latest Ref Rng & Units 03/20/2018 02/21/2017  Eye Exam No Retinopathy - No Retinopathy  Foot Form Completion - Done -

## 2021-06-01 ENCOUNTER — Ambulatory Visit (HOSPITAL_COMMUNITY)
Admission: RE | Admit: 2021-06-01 | Discharge: 2021-06-01 | Disposition: A | Discharge status: Home / Self Care | Payer: BC Managed Care – PPO | Source: Ambulatory Visit | Attending: Family Medicine | Admitting: Family Medicine

## 2021-06-01 ENCOUNTER — Ambulatory Visit: Payer: BC Managed Care – PPO | Admitting: Family Medicine

## 2021-06-01 ENCOUNTER — Other Ambulatory Visit: Payer: Self-pay

## 2021-06-01 DIAGNOSIS — Z1231 Encounter for screening mammogram for malignant neoplasm of breast: Secondary | ICD-10-CM | POA: Insufficient documentation

## 2021-06-07 ENCOUNTER — Ambulatory Visit: Payer: BC Managed Care – PPO | Admitting: Family Medicine

## 2021-06-21 DIAGNOSIS — M545 Low back pain, unspecified: Secondary | ICD-10-CM | POA: Diagnosis not present

## 2021-06-21 DIAGNOSIS — G5601 Carpal tunnel syndrome, right upper limb: Secondary | ICD-10-CM | POA: Diagnosis not present

## 2021-06-21 DIAGNOSIS — I10 Essential (primary) hypertension: Secondary | ICD-10-CM | POA: Diagnosis not present

## 2021-06-21 DIAGNOSIS — M5416 Radiculopathy, lumbar region: Secondary | ICD-10-CM | POA: Diagnosis not present

## 2021-06-24 ENCOUNTER — Other Ambulatory Visit: Payer: Self-pay | Admitting: Family Medicine

## 2021-08-02 ENCOUNTER — Other Ambulatory Visit (HOSPITAL_COMMUNITY)
Admission: RE | Admit: 2021-08-02 | Discharge: 2021-08-02 | Disposition: A | Discharge status: Home / Self Care | Payer: BC Managed Care – PPO | Source: Ambulatory Visit | Attending: Family Medicine | Admitting: Family Medicine

## 2021-08-02 ENCOUNTER — Encounter: Payer: Self-pay | Admitting: Family Medicine

## 2021-08-02 ENCOUNTER — Ambulatory Visit (INDEPENDENT_AMBULATORY_CARE_PROVIDER_SITE_OTHER): Payer: BC Managed Care – PPO | Admitting: Family Medicine

## 2021-08-02 ENCOUNTER — Other Ambulatory Visit: Payer: Self-pay

## 2021-08-02 VITALS — BP 131/85 | HR 86 | Resp 16 | Ht 63.0 in | Wt 209.1 lb

## 2021-08-02 DIAGNOSIS — Z124 Encounter for screening for malignant neoplasm of cervix: Secondary | ICD-10-CM | POA: Insufficient documentation

## 2021-08-02 DIAGNOSIS — Z23 Encounter for immunization: Secondary | ICD-10-CM | POA: Diagnosis not present

## 2021-08-02 DIAGNOSIS — E0500 Thyrotoxicosis with diffuse goiter without thyrotoxic crisis or storm: Secondary | ICD-10-CM

## 2021-08-02 DIAGNOSIS — Z0000 Encounter for general adult medical examination without abnormal findings: Secondary | ICD-10-CM | POA: Diagnosis not present

## 2021-08-02 DIAGNOSIS — E1169 Type 2 diabetes mellitus with other specified complication: Secondary | ICD-10-CM | POA: Insufficient documentation

## 2021-08-02 DIAGNOSIS — E1159 Type 2 diabetes mellitus with other circulatory complications: Secondary | ICD-10-CM | POA: Diagnosis not present

## 2021-08-02 DIAGNOSIS — E785 Hyperlipidemia, unspecified: Secondary | ICD-10-CM

## 2021-08-02 MED ORDER — TIRZEPATIDE 2.5 MG/0.5ML ~~LOC~~ SOAJ: 2.5000 mg | SUBCUTANEOUS | 2 refills | Status: DC

## 2021-08-02 NOTE — Assessment & Plan Note (Signed)

## 2021-08-02 NOTE — Progress Notes (Signed)
    Brittany Maxwell     MRN: [id number]      DOB: January 28, 1966  HPI: Patient is in for annual physical exam. No other health concerns are addressed Labs today. Immunization is reviewed , and  updated .   PE: BP 131/85   Pulse 86   Resp 16   Ht 5\' 3"  (1.6 m)   Wt 209 lb 1.9 oz (94.9 kg)   SpO2 97%   BMI 37.04 kg/m  Pleasant  female, alert and oriented x 3, in no cardio-pulmonary distress. Afebrile. HEENT No facial trauma or asymetry. Sinuses non tender.  Extra occullar muscles intact.. External ears normal, . Neck: supple, no adenopathy,JVD or thyromegaly.No bruits.  Chest: Clear to ascultation bilaterally.No crackles or wheezes. Non tender to palpation  Breast: No asymetry,no masses or lumps. No tenderness. No nipple discharge or inversion. No axillary or supraclavicular adenopathy  Cardiovascular system; Heart sounds normal,  S1 and  S2 ,no S3.  No murmur, or thrill. Apical beat not displaced Peripheral pulses normal.  Abdomen: Soft, non tender, no organomegaly or masses. No bruits. Bowel sounds normal. No guarding, tenderness or rebound.   GU: External genitalia normal female genitalia , normal female distribution of hair. No lesions. Urethral meatus normal in size, no  Prolapse, no lesions visibly  Present. Bladder non tender. Vagina pink and moist , with no visible lesions , discharge present . Adequate pelvic support no  cystocele or rectocele noted Cervix pink and appears healthy, no lesions or ulcerations noted, no discharge noted from os Uterus normal size, no adnexal masses, no cervical motion or adnexal tenderness.   Musculoskeletal exam: Full ROM of spine, hips , shoulders and knees. No deformity ,swelling or crepitus noted. No muscle wasting or atrophy.   Neurologic: Cranial nerves 2 to 12 intact. Power, tone ,sensation and reflexes normal throughout. No disturbance in gait. No tremor.  Skin: Intact, no ulceration, erythema , scaling or  rash noted. Pigmentation normal throughout  Psych; Normal mood and affect. Judgement and concentration normal   Assessment & Plan:  Annual physical exam Annual exam as documented. Counseling done  re healthy lifestyle involving commitment to 150 minutes exercise per week, heart healthy diet, and attaining healthy weight.The importance of adequate sleep also discussed. Regular seat belt use and home safety, is also discussed. Changes in health habits are decided on by the patient with goals and time frames  set for achieving them. Immunization and cancer screening needs are specifically addressed at this visit.

## 2021-08-02 NOTE — Patient Instructions (Addendum)
F/U in 2 months, call if you need me before    Flu vaccine today and pneumoniua 20  Please arrange retinal screening I office , pt is diabetic  Microalb today  New is mounjaro weekly for diabetes and weight loss, continue daily metformin   CBC and TSH, Lipid, cmp and EGFr and hBa1C today  Please give coupon for [facility]  It is important that you exercise regularly at least 30 minutes 5 times a week. If you develop chest pain, have severe difficulty breathing, or feel very tired, stop exercising immediately and seek medical attention   Think about what you will eat, plan ahead. Choose " clean, green, fresh or frozen" over canned, processed or packaged foods which are more sugary, salty and fatty. 70 to 75% of food eaten should be vegetables and fruit. Three meals at set times with snacks allowed between meals, but they must be fruit or vegetables. Aim to eat over a 12 hour period , example 7 am to 7 pm, and STOP after  your last meal of the day. Drink water,generally about 64 ounces per day, no other drink is as healthy. Fruit juice is best enjoyed in a healthy way, by EATING the fruit. Thanks for choosing [facility], we consider it a privelige to serve you.

## 2021-08-03 ENCOUNTER — Other Ambulatory Visit: Payer: Self-pay | Admitting: Family Medicine

## 2021-08-03 MED ORDER — ROSUVASTATIN CALCIUM 20 MG PO TABS
20.0000 mg | ORAL_TABLET | Freq: Every day | ORAL | 3 refills | Status: DC
Start: 2021-08-03 — End: 2022-12-05

## 2021-08-03 NOTE — Progress Notes (Signed)
Change in n meds, stop metformin, change to crestor from pravachol

## 2021-08-04 LAB — CMP14+EGFR
ALT: 15 IU/L (ref 0–32)
AST: 18 IU/L (ref 0–40)
Albumin/Globulin Ratio: 1.6 (ref 1.2–2.2)
Albumin: 4.5 g/dL (ref 3.8–4.9)
Alkaline Phosphatase: 82 IU/L (ref 44–121)
BUN/Creatinine Ratio: 12 (ref 9–23)
BUN: 14 mg/dL (ref 6–24)
Bilirubin Total: 0.4 mg/dL (ref 0.0–1.2)
CO2: 28 mmol/L (ref 20–29)
Calcium: 10 mg/dL (ref 8.7–10.2)
Chloride: 99 mmol/L (ref 96–106)
Creatinine, Ser: 1.14 mg/dL — ABNORMAL HIGH (ref 0.57–1.00)
Globulin, Total: 2.9 g/dL (ref 1.5–4.5)
Glucose: 104 mg/dL — ABNORMAL HIGH (ref 70–99)
Potassium: 3.4 mmol/L — ABNORMAL LOW (ref 3.5–5.2)
Sodium: 139 mmol/L (ref 134–144)
Total Protein: 7.4 g/dL (ref 6.0–8.5)
eGFR: 56 mL/min/{1.73_m2} — ABNORMAL LOW (ref >59)

## 2021-08-04 LAB — CBC WITH DIFFERENTIAL/PLATELET
Basophils Absolute: 0.1 10*3/uL (ref 0.0–0.2)
Basos: 1 %
EOS (ABSOLUTE): 0.2 10*3/uL (ref 0.0–0.4)
Eos: 2 %
Hematocrit: 34.3 % (ref 34.0–46.6)
Hemoglobin: 11.4 g/dL (ref 11.1–15.9)
Immature Grans (Abs): 0 10*3/uL (ref 0.0–0.1)
Immature Granulocytes: 0 %
Lymphocytes Absolute: 3.5 10*3/uL — ABNORMAL HIGH (ref 0.7–3.1)
Lymphs: 43 %
MCH: 27.6 pg (ref 26.6–33.0)
MCHC: 33.2 g/dL (ref 31.5–35.7)
MCV: 83 fL (ref 79–97)
Monocytes Absolute: 0.4 10*3/uL (ref 0.1–0.9)
Monocytes: 5 %
Neutrophils Absolute: 4 10*3/uL (ref 1.4–7.0)
Neutrophils: 49 %
Platelets: 420 10*3/uL (ref 150–450)
RBC: 4.13 x10E6/uL (ref 3.77–5.28)
RDW: 13.5 % (ref 11.7–15.4)
WBC: 8.1 10*3/uL (ref 3.4–10.8)

## 2021-08-04 LAB — LIPID PANEL
Chol/HDL Ratio: 3.2 ratio (ref 0.0–4.4)
Cholesterol, Total: 185 mg/dL (ref 100–199)
HDL: 58 mg/dL (ref >39)
LDL Chol Calc (NIH): 109 mg/dL — ABNORMAL HIGH (ref 0–99)
Triglycerides: 101 mg/dL (ref 0–149)
VLDL Cholesterol Cal: 18 mg/dL (ref 5–40)

## 2021-08-04 LAB — TSH: TSH: 2.01 u[IU]/mL (ref 0.450–4.500)

## 2021-08-04 LAB — HEMOGLOBIN A1C
Est. average glucose Bld gHb Est-mCnc: 151 mg/dL
Hgb A1c MFr Bld: 6.9 % — ABNORMAL HIGH (ref 4.8–5.6)

## 2021-08-04 LAB — MICROALBUMIN, URINE: Microalbumin, Urine: 27 ug/mL

## 2021-08-05 ENCOUNTER — Encounter: Payer: Self-pay | Admitting: Family Medicine

## 2021-08-05 DIAGNOSIS — R87610 Atypical squamous cells of undetermined significance on cytologic smear of cervix (ASC-US): Secondary | ICD-10-CM | POA: Insufficient documentation

## 2021-08-05 LAB — CYTOLOGY - PAP
Comment: NEGATIVE
Diagnosis: UNDETERMINED — AB
High risk HPV: NEGATIVE

## 2021-08-08 ENCOUNTER — Encounter: Payer: Self-pay | Admitting: Family Medicine

## 2021-08-24 ENCOUNTER — Ambulatory Visit: Payer: BC Managed Care – PPO

## 2021-09-12 ENCOUNTER — Encounter: Payer: Self-pay | Admitting: Family Medicine

## 2021-09-15 DIAGNOSIS — G5601 Carpal tunnel syndrome, right upper limb: Secondary | ICD-10-CM | POA: Diagnosis not present

## 2021-09-15 DIAGNOSIS — M5416 Radiculopathy, lumbar region: Secondary | ICD-10-CM | POA: Diagnosis not present

## 2021-09-15 DIAGNOSIS — M545 Low back pain, unspecified: Secondary | ICD-10-CM | POA: Diagnosis not present

## 2021-09-15 DIAGNOSIS — Z79891 Long term (current) use of opiate analgesic: Secondary | ICD-10-CM | POA: Diagnosis not present

## 2021-09-29 ENCOUNTER — Ambulatory Visit: Payer: BC Managed Care – PPO

## 2021-09-29 ENCOUNTER — Other Ambulatory Visit: Payer: Self-pay

## 2021-09-29 LAB — HM DIABETES EYE EXAM

## 2021-10-06 ENCOUNTER — Other Ambulatory Visit: Payer: Self-pay

## 2021-10-06 ENCOUNTER — Telehealth: Payer: Self-pay

## 2021-10-06 MED ORDER — TRIAMTERENE-HCTZ 75-50 MG PO TABS: 1.0000 | ORAL_TABLET | Freq: Every day | ORAL | 1 refills | Status: DC

## 2021-10-06 NOTE — Telephone Encounter (Signed)
Refills sent

## 2021-10-06 NOTE — Telephone Encounter (Signed)
Patient called need refill on   Metformin 500 mg  Triamterene - hydrochlorothiazide 75 mg  Pharmacy:  [facility]

## 2021-10-07 ENCOUNTER — Ambulatory Visit: Payer: BC Managed Care – PPO | Admitting: Family Medicine

## 2021-10-17 ENCOUNTER — Other Ambulatory Visit: Payer: Self-pay | Admitting: Family Medicine

## 2021-10-18 ENCOUNTER — Other Ambulatory Visit: Payer: Self-pay

## 2021-10-18 ENCOUNTER — Telehealth: Payer: Self-pay | Admitting: Family Medicine

## 2021-10-18 ENCOUNTER — Other Ambulatory Visit: Payer: Self-pay | Admitting: Family Medicine

## 2021-10-18 MED ORDER — TRIAMTERENE-HCTZ 75-50 MG PO TABS: 1.0000 | ORAL_TABLET | Freq: Every day | ORAL | 1 refills | Status: DC

## 2021-10-18 MED ORDER — MOUNJARO 2.5 MG/0.5ML ~~LOC~~ SOAJ: SUBCUTANEOUS | 0 refills | Status: DC

## 2021-10-18 MED ORDER — HYDROXYZINE HCL 50 MG PO TABS: ORAL_TABLET | ORAL | 4 refills | Status: DC

## 2021-10-18 MED ORDER — METFORMIN HCL 500 MG PO TABS: 500.0000 mg | ORAL_TABLET | Freq: Every day | ORAL | 1 refills | Status: DC

## 2021-10-18 NOTE — Telephone Encounter (Signed)
Refills sent

## 2021-10-18 NOTE — Telephone Encounter (Signed)
Pt needs refill on  METFORMIN   Did not see on current med list   Can call pt and discuss

## 2021-10-18 NOTE — Telephone Encounter (Signed)
Refilled to [facility] 

## 2021-10-18 NOTE — Telephone Encounter (Signed)
Pt called in for refills on   MOUNJARO 2.5 MG/0.5ML Pen  triamterene-hydrochlorothiazide (MAXZIDE) 75-50 MG tablet   hydrOXYzine (ATARAX/VISTARIL) 50 MG tablet   [facility] [place]

## 2021-11-11 ENCOUNTER — Encounter: Payer: Self-pay | Admitting: Family Medicine

## 2021-11-11 ENCOUNTER — Ambulatory Visit: Payer: BC Managed Care – PPO | Admitting: Family Medicine

## 2021-11-11 ENCOUNTER — Other Ambulatory Visit: Payer: Self-pay

## 2021-11-11 VITALS — BP 128/84 | HR 77 | Ht 63.0 in | Wt 203.0 lb

## 2021-11-11 DIAGNOSIS — E785 Hyperlipidemia, unspecified: Secondary | ICD-10-CM | POA: Diagnosis not present

## 2021-11-11 DIAGNOSIS — I10 Essential (primary) hypertension: Secondary | ICD-10-CM

## 2021-11-11 DIAGNOSIS — Z23 Encounter for immunization: Secondary | ICD-10-CM | POA: Diagnosis not present

## 2021-11-11 DIAGNOSIS — Z1231 Encounter for screening mammogram for malignant neoplasm of breast: Secondary | ICD-10-CM | POA: Diagnosis not present

## 2021-11-11 DIAGNOSIS — E1159 Type 2 diabetes mellitus with other circulatory complications: Secondary | ICD-10-CM

## 2021-11-11 DIAGNOSIS — E669 Obesity, unspecified: Secondary | ICD-10-CM

## 2021-11-11 DIAGNOSIS — F324 Major depressive disorder, single episode, in partial remission: Secondary | ICD-10-CM

## 2021-11-11 MED ORDER — BLOOD GLUCOSE METER KIT: PACK | 0 refills | Status: AC

## 2021-11-11 MED ORDER — LANCETS 30G MISC: 5 refills | Status: DC

## 2021-11-11 MED ORDER — GLUCOSE BLOOD VI STRP: ORAL_STRIP | 12 refills | Status: DC

## 2021-11-11 NOTE — Patient Instructions (Addendum)
F/u in 3.5 months, call if you need me sooner  Annual with pap 11/2 or after  No medication change  hBA1C, cmp and eGFR and lipid panel today  TdAP today  Congrats on weight loss  Nurse pls order test supplies and monitor for once daily testing  Pls schedule mammogram due in September at checkout  It is important that you exercise regularly at least 30 minutes 5 times a week. If you develop chest pain, have severe difficulty breathing, or feel very tired, stop exercising immediately and seek medical attention   Thanks for choosing [facility], we consider it a privelige to serve you.

## 2021-11-12 LAB — CMP14+EGFR
ALT: 17 IU/L (ref 0–32)
AST: 19 IU/L (ref 0–40)
Albumin/Globulin Ratio: 1.4 (ref 1.2–2.2)
Albumin: 4.6 g/dL (ref 3.8–4.9)
Alkaline Phosphatase: 96 IU/L (ref 44–121)
BUN/Creatinine Ratio: 12 (ref 9–23)
BUN: 15 mg/dL (ref 6–24)
Bilirubin Total: 0.5 mg/dL (ref 0.0–1.2)
CO2: 25 mmol/L (ref 20–29)
Calcium: 9.9 mg/dL (ref 8.7–10.2)
Chloride: 100 mmol/L (ref 96–106)
Creatinine, Ser: 1.23 mg/dL — ABNORMAL HIGH (ref 0.57–1.00)
Globulin, Total: 3.2 g/dL (ref 1.5–4.5)
Glucose: 92 mg/dL (ref 70–99)
Potassium: 3.2 mmol/L — ABNORMAL LOW (ref 3.5–5.2)
Sodium: 140 mmol/L (ref 134–144)
Total Protein: 7.8 g/dL (ref 6.0–8.5)
eGFR: 52 mL/min/{1.73_m2} — ABNORMAL LOW (ref >59)

## 2021-11-12 LAB — LIPID PANEL
Chol/HDL Ratio: 2.5 ratio (ref 0.0–4.4)
Cholesterol, Total: 136 mg/dL (ref 100–199)
HDL: 54 mg/dL (ref >39)
LDL Chol Calc (NIH): 59 mg/dL (ref 0–99)
Triglycerides: 130 mg/dL (ref 0–149)
VLDL Cholesterol Cal: 23 mg/dL (ref 5–40)

## 2021-11-12 LAB — HEMOGLOBIN A1C
Est. average glucose Bld gHb Est-mCnc: 137 mg/dL
Hgb A1c MFr Bld: 6.4 % — ABNORMAL HIGH (ref 4.8–5.6)

## 2021-11-13 ENCOUNTER — Encounter: Payer: Self-pay | Admitting: Family Medicine

## 2021-11-13 MED ORDER — POTASSIUM CHLORIDE CRYS ER 20 MEQ PO TBCR: 20.0000 meq | EXTENDED_RELEASE_TABLET | Freq: Every day | ORAL | 5 refills | Status: DC

## 2021-11-13 NOTE — Assessment & Plan Note (Signed)
Controlled, no change in medication  

## 2021-11-13 NOTE — Assessment & Plan Note (Signed)
°  Patient re-educated about  the importance of commitment to a  minimum of 150 minutes of exercise per week as able.  The importance of healthy food choices with portion control discussed, as well as eating regularly and within a 12 hour window most days. The need to choose "clean , green" food 50 to 75% of the time is discussed, as well as to make water the primary drink and set a goal of 64 ounces water daily.    Weight /BMI 11/11/2021 08/02/2021 03/09/2021  WEIGHT 203 lb 0.6 oz 209 lb 1.9 oz 207 lb 1.9 oz  HEIGHT 5\' 3"  5\' 3"  5\' 3"   BMI 35.97 kg/m2 37.04 kg/m2 36.69 kg/m2

## 2021-11-13 NOTE — Assessment & Plan Note (Signed)
Hyperlipidemia:Low fat diet discussed and encouraged.   Lipid Panel  Lab Results  Component Value Date   CHOL 136 11/11/2021   HDL 54 11/11/2021   LDLCALC 59 11/11/2021   TRIG 130 11/11/2021   CHOLHDL 2.5 11/11/2021     Controlled, no change in medication  

## 2021-11-13 NOTE — Assessment & Plan Note (Signed)
DASH diet and commitment to daily physical activity for a minimum of 30 minutes discussed and encouraged, as a part of hypertension management. The importance of attaining a healthy weight is also discussed.  BP/Weight 11/11/2021 08/02/2021 03/09/2021 11/09/2020 08/12/2020 08/06/2020 06/01/2020  Systolic BP 128 131 124 126 127 - 124  Diastolic BP 84 85 84 92 91 - 86  Wt. (Lbs) 203.04 209.12 207.12 198 208 208 203  BMI 35.97 37.04 36.69 33.99 35.7 35.7 34.84     Controlled, no change in medication

## 2021-11-13 NOTE — Assessment & Plan Note (Signed)
[name] is reminded of the importance of commitment to daily physical activity for 30 minutes or more, as able and the need to limit carbohydrate intake to 30 to 60 grams per meal to help with blood sugar control.   The need to take medication as prescribed, test blood sugar as directed, and to call between visits if there is a concern that blood sugar is uncontrolled is also discussed.   [name] is reminded of the importance of daily foot exam, annual eye examination, and good blood sugar, blood pressure and cholesterol control.  Diabetic Labs Latest Ref Rng & Units 11/11/2021 08/02/2021 03/09/2021 03/08/2021 10/29/2020  HbA1c 4.8 - 5.6 % 6.4(H) 6.9(H) 6.7 - 6.4(H)  Microalbumin Not Estab. ug/mL - - - - -  Micro/Creat Ratio 0.0 - 30.0 mg/g creat - - - - -  Chol 100 - 199 mg/dL 283 662 - - 947  HDL >65 mg/dL 54 58 - - 57  Calc LDL 0 - 99 mg/dL 59 465(K) - - 90  Triglycerides 0 - 149 mg/dL 354 656 - - 812(X)  Creatinine 0.57 - 1.00 mg/dL 5.17(G) 0.17(C) - 9.44(H) 1.30(H)   BP/Weight 11/11/2021 08/02/2021 03/09/2021 11/09/2020 08/12/2020 08/06/2020 06/01/2020  Systolic BP 128 131 124 126 127 - 124  Diastolic BP 84 85 84 92 91 - 86  Wt. (Lbs) 203.04 209.12 207.12 198 208 208 203  BMI 35.97 37.04 36.69 33.99 35.7 35.7 34.84   Foot/eye exam completion dates Latest Ref Rng & Units 09/29/2021 03/20/2018  Eye Exam No Retinopathy No Retinopathy -  Foot Form Completion - - Done      D/c metformin

## 2021-11-13 NOTE — Progress Notes (Signed)
Brittany Maxwell     MRN: [id number]      DOB: 04-29-1966   HPI Brittany Maxwell is here for follow up and re-evaluation of chronic medical conditions, medication management and review of any available recent lab and radiology data.  Preventive health is updated, specifically  Cancer screening and Immunization.   Questions or concerns regarding consultations or procedures which the PT has had in the interim are  addressed. The PT denies any adverse reactions to current medications since the last visit.  There are no new concerns.  There are no specific complaints  Denies polyuria, polydipsia, blurred vision , or hypoglycemic episodes.   ROS Denies recent fever or chills. Denies sinus pressure, nasal congestion, ear pain or sore throat. Denies chest congestion, productive cough or wheezing. Denies chest pains, palpitations and leg swelling Denies abdominal pain, nausea, vomiting,diarrhea or constipation.   Denies dysuria, frequency, hesitancy or incontinence. Denies uncontrolled  joint pain, swelling and limitation in mobility. Denies headaches, seizures, numbness, or tingling. Denies depression, anxiety or insomnia. Denies skin break down or rash.   PE  BP 128/84    Pulse 77    Ht 5\' 3"  (1.6 m)    Wt 203 lb 0.6 oz (92.1 kg)    SpO2 97%    BMI 35.97 kg/m   Patient alert and oriented and in no cardiopulmonary distress.  HEENT: No facial asymmetry, EOMI,     Neck supple .  Chest: Clear to auscultation bilaterally.  CVS: S1, S2 no murmurs, no S3.Regular rate.  ABD: Soft non tender.   Ext: No edema  MS: Adequate though reduced  ROM spine, shoulders, hips and knees.  Skin: Intact, no ulcerations or rash noted.  Psych: Good eye contact, normal affect. Memory intact not anxious or depressed appearing.  CNS: CN 2-12 intact, power,  normal throughout.no focal deficits noted.   Assessment & Plan  Type 2 diabetes mellitus with vascular disease [facility]) Brittany Maxwell is reminded  of the importance of commitment to daily physical activity for 30 minutes or more, as able and the need to limit carbohydrate intake to 30 to 60 grams per meal to help with blood sugar control.   The need to take medication as prescribed, test blood sugar as directed, and to call between visits if there is a concern that blood sugar is uncontrolled is also discussed.   Brittany Maxwell is reminded of the importance of daily foot exam, annual eye examination, and good blood sugar, blood pressure and cholesterol control.  Diabetic Labs Latest Ref Rng & Units 11/11/2021 08/02/2021 03/09/2021 03/08/2021 10/29/2020  HbA1c 4.8 - 5.6 % 6.4(H) 6.9(H) 6.7 - 6.4(H)  Microalbumin Not Estab. ug/mL - - - - -  Micro/Creat Ratio 0.0 - 30.0 mg/g creat - - - - -  Chol 100 - 199 mg/dL 10/31/2020 417 - - 408  HDL 144 mg/dL 54 58 - - 57  Calc LDL 0 - 99 mg/dL 59 >81) - - 90  Triglycerides 0 - 149 mg/dL 856(D 149 - - 702)  Creatinine 0.57 - 1.00 mg/dL 637(C) 5.88(F) - 0.27(X) 1.30(H)   BP/Weight 11/11/2021 08/02/2021 03/09/2021 11/09/2020 08/12/2020 08/06/2020 06/01/2020  Systolic BP 128 131 124 126 127 - 124  Diastolic BP 84 85 84 92 91 - 86  Wt. (Lbs) 203.04 209.12 207.12 198 208 208 203  BMI 35.97 37.04 36.69 33.99 35.7 35.7 34.84   Foot/eye exam completion dates Latest Ref Rng & Units 09/29/2021 03/20/2018  Eye Exam No Retinopathy  No Retinopathy -  Foot Form Completion - - Done      D/c metformin   Essential hypertension, benign DASH diet and commitment to daily physical activity for a minimum of 30 minutes discussed and encouraged, as a part of hypertension management. The importance of attaining a healthy weight is also discussed.  BP/Weight 11/11/2021 08/02/2021 03/09/2021 11/09/2020 08/12/2020 08/06/2020 06/01/2020  Systolic BP 128 131 124 126 127 - 124  Diastolic BP 84 85 84 92 91 - 86  Wt. (Lbs) 203.04 209.12 207.12 198 208 208 203  BMI 35.97 37.04 36.69 33.99 35.7 35.7 34.84     Controlled, no change in  medication   Hyperlipidemia LDL goal <100 Hyperlipidemia:Low fat diet discussed and encouraged.   Lipid Panel  Lab Results  Component Value Date   CHOL 136 11/11/2021   HDL 54 11/11/2021   LDLCALC 59 11/11/2021   TRIG 130 11/11/2021   CHOLHDL 2.5 11/11/2021     Controlled, no change in medication   Obesity (BMI 30.0-34.9)  Patient re-educated about  the importance of commitment to a  minimum of 150 minutes of exercise per week as able.  The importance of healthy food choices with portion control discussed, as well as eating regularly and within a 12 hour window most days. The need to choose "clean , green" food 50 to 75% of the time is discussed, as well as to make water the primary drink and set a goal of 64 ounces water daily.    Weight /BMI 11/11/2021 08/02/2021 03/09/2021  WEIGHT 203 lb 0.6 oz 209 lb 1.9 oz 207 lb 1.9 oz  HEIGHT 5\' 3"  5\' 3"  5\' 3"   BMI 35.97 kg/m2 37.04 kg/m2 36.69 kg/m2      Major depression in partial remission (HCC) Controlled, no change in medication

## 2021-11-14 ENCOUNTER — Other Ambulatory Visit: Payer: Self-pay

## 2021-12-12 ENCOUNTER — Other Ambulatory Visit: Payer: Self-pay | Admitting: Family Medicine

## 2021-12-15 ENCOUNTER — Other Ambulatory Visit: Payer: Self-pay | Admitting: Family Medicine

## 2021-12-23 DIAGNOSIS — I10 Essential (primary) hypertension: Secondary | ICD-10-CM | POA: Diagnosis not present

## 2021-12-23 DIAGNOSIS — M545 Low back pain, unspecified: Secondary | ICD-10-CM | POA: Diagnosis not present

## 2021-12-23 DIAGNOSIS — M5416 Radiculopathy, lumbar region: Secondary | ICD-10-CM | POA: Diagnosis not present

## 2021-12-23 DIAGNOSIS — G5603 Carpal tunnel syndrome, bilateral upper limbs: Secondary | ICD-10-CM | POA: Diagnosis not present

## 2021-12-23 DIAGNOSIS — Z79891 Long term (current) use of opiate analgesic: Secondary | ICD-10-CM | POA: Diagnosis not present

## 2022-01-13 ENCOUNTER — Other Ambulatory Visit: Payer: Self-pay | Admitting: Family Medicine

## 2022-02-10 ENCOUNTER — Other Ambulatory Visit: Payer: Self-pay | Admitting: Family Medicine

## 2022-02-14 ENCOUNTER — Ambulatory Visit: Payer: BC Managed Care – PPO | Admitting: Family Medicine

## 2022-02-24 ENCOUNTER — Ambulatory Visit: Payer: BC Managed Care – PPO | Admitting: Family Medicine

## 2022-02-24 ENCOUNTER — Encounter: Payer: Self-pay | Admitting: Family Medicine

## 2022-02-24 VITALS — BP 132/86 | HR 83 | Resp 16 | Ht 63.0 in | Wt 211.4 lb

## 2022-02-24 DIAGNOSIS — E785 Hyperlipidemia, unspecified: Secondary | ICD-10-CM

## 2022-02-24 DIAGNOSIS — I10 Essential (primary) hypertension: Secondary | ICD-10-CM

## 2022-02-24 DIAGNOSIS — F324 Major depressive disorder, single episode, in partial remission: Secondary | ICD-10-CM

## 2022-02-24 DIAGNOSIS — E1159 Type 2 diabetes mellitus with other circulatory complications: Secondary | ICD-10-CM | POA: Diagnosis not present

## 2022-02-24 DIAGNOSIS — N62 Hypertrophy of breast: Secondary | ICD-10-CM | POA: Diagnosis not present

## 2022-02-24 DIAGNOSIS — E559 Vitamin D deficiency, unspecified: Secondary | ICD-10-CM

## 2022-02-24 DIAGNOSIS — R87610 Atypical squamous cells of undetermined significance on cytologic smear of cervix (ASC-US): Secondary | ICD-10-CM

## 2022-02-24 DIAGNOSIS — E669 Obesity, unspecified: Secondary | ICD-10-CM

## 2022-02-24 DIAGNOSIS — E66811 Obesity, class 1: Secondary | ICD-10-CM

## 2022-02-24 MED ORDER — TIRZEPATIDE 5 MG/0.5ML ~~LOC~~ SOAJ: 5.0000 mg | SUBCUTANEOUS | 3 refills | Status: DC

## 2022-02-24 NOTE — Patient Instructions (Addendum)
F/U in 13 weeks, call if you need me sooner  Non fasting cBC, chem 7 and eGFr, HBa1C, tSH and vit D 5 days before visit\  You are referred for consultation for breast reduction surgery, you would benefit from this   It is important that you exercise regularly at least 30 minutes 5 times a week. If you develop chest pain, have severe difficulty breathing, or feel very tired, stop exercising immediately and seek medical attention    Dose increase of mounjaro when next due  Change to 85 % plant  based  diet, vegetables, fruit and protein, 64 oz water daily  It is important that you exercise regularly at least 30 minutes 5 times a week. If you develop chest pain, have severe difficulty breathing, or feel very tired, stop exercising immediately and seek medical attention

## 2022-02-24 NOTE — Assessment & Plan Note (Signed)
Bilateral enlarged, shoulder and  Upper back pain, size is 42 DDD, refer surgery

## 2022-02-26 ENCOUNTER — Encounter: Payer: Self-pay | Admitting: Family Medicine

## 2022-02-26 NOTE — Assessment & Plan Note (Signed)
Needs rept pap in 2023

## 2022-02-26 NOTE — Assessment & Plan Note (Signed)
Improved , continue current medication 

## 2022-02-26 NOTE — Progress Notes (Signed)
Brittany Maxwell     MRN: [id number]      DOB: [birth year]   HPI Brittany Maxwell is here for follow up and re-evaluation of chronic medical conditions, medication management and review of any available recent lab and radiology data.  Preventive health is updated, specifically  Cancer screening and Immunization.   Questions or concerns regarding consultations or procedures which the PT has had in the interim are  addressed. The PT denies any adverse reactions to current medications since the last visit.  Requests referral for breast reduction surgery,c/o upper back and shoulder pain ROS Denies recent fever or chills. Denies sinus pressure, nasal congestion, ear pain or sore throat. Denies chest congestion, productive cough or wheezing. Denies chest pains, palpitations and leg swelling Denies abdominal pain, nausea, vomiting,diarrhea or constipation.   Denies dysuria, frequency, hesitancy or incontinence. Denies headaches, seizures, numbness, or tingling. Denies depression, anxiety or insomnia. Denies skin break down or rash.   PE  BP 132/86   Pulse 83   Resp 16   Ht 5\' 3"  (1.6 m)   Wt 211 lb 6.4 oz (95.9 kg)   SpO2 94%   BMI 37.45 kg/m   Patient alert and oriented and in no cardiopulmonary distress.  HEENT: No facial asymmetry, EOMI,     Neck supple .  Chest: Clear to auscultation bilaterally.  CVS: S1, S2 no murmurs, no S3.Regular rate.  ABD: Soft non tender.   Ext: No edema  MS: Adequate ROM spine, shoulders, hips and knees.  Skin: Intact, no ulcerations or rash noted.  Psych: Good eye contact, normal affect. Memory intact not anxious or depressed appearing.  CNS: CN 2-12 intact, power,  normal throughout.no focal deficits noted.   Assessment & Plan  Macromastia Bilateral enlarged, shoulder and  Upper back pain, size is 42 DDD, refer surgery  ASCUS of cervix with negative high risk HPV Needs rept pap in 2023  Type 2 diabetes mellitus with vascular disease  (HCC) Brittany Maxwell is reminded of the importance of commitment to daily physical activity for 30 minutes or more, as able and the need to limit carbohydrate intake to 30 to 60 grams per meal to help with blood sugar control.   The need to take medication as prescribed, test blood sugar as directed, and to call between visits if there is a concern that blood sugar is uncontrolled is also discussed.   Brittany Maxwell is reminded of the importance of daily foot exam, annual eye examination, and good blood sugar, blood pressure and cholesterol control. improved     Latest Ref Rng & Units 11/11/2021   11:03 AM 08/02/2021   10:10 AM 03/09/2021   11:35 AM 03/08/2021    9:03 AM 10/29/2020    8:31 AM  Diabetic Labs  HbA1c 4.8 - 5.6 % 6.4   6.9   6.7    6.4    Chol 100 - 199 mg/dL 10/31/2020   062     376    HDL >39 mg/dL 54   58     57    Calc LDL 0 - 99 mg/dL 59   283     90    Triglycerides 0 - 149 mg/dL 151   761     607    Creatinine 0.57 - 1.00 mg/dL 371   0.62    6.94   8.54        02/24/2022    8:35 AM 11/11/2021    9:54 AM 08/02/2021  9:26 AM 03/09/2021    9:38 AM 03/09/2021    9:22 AM 11/09/2020    7:54 AM 08/12/2020    3:39 PM  BP/Weight  Systolic BP 132 128 131 124 148 126 127  Diastolic BP 86 84 85 84 97 92 91  Wt. (Lbs) 211.4 203.04 209.12  207.12 198 208  BMI 37.45 kg/m2 35.97 kg/m2 37.04 kg/m2  36.69 kg/m2 33.99 kg/m2 35.7 kg/m2      Latest Ref Rng & Units 09/29/2021   12:00 AM 03/20/2018    2:40 PM  Foot/eye exam completion dates  Eye Exam No Retinopathy No Retinopathy        Foot Form Completion   Done     This result is from an external source.        Obesity (BMI 30.0-34.9)  Patient re-educated about  the importance of commitment to a  minimum of 150 minutes of exercise per week as able.  The importance of healthy food choices with portion control discussed, as well as eating regularly and within a 12 hour window most days. The need to choose "clean , green" food 50 to  75% of the time is discussed, as well as to make water the primary drink and set a goal of 64 ounces water daily.       02/24/2022    8:35 AM 11/11/2021    9:54 AM 08/02/2021    9:26 AM  Weight /BMI  Weight 211 lb 6.4 oz 203 lb 0.6 oz 209 lb 1.9 oz  Height 5\' 3"  (1.6 m) 5\' 3"  (1.6 m) 5\' 3"  (1.6 m)  BMI 37.45 kg/m2 35.97 kg/m2 37.04 kg/m2   detreriorated   Major depression in partial remission (HCC) Improved, continue current medication  Hyperlipidemia LDL goal <100 Hyperlipidemia:Low fat diet discussed and encouraged.   Lipid Panel  Lab Results  Component Value Date   CHOL 136 11/11/2021   HDL 54 11/11/2021   LDLCALC 59 11/11/2021   TRIG 130 11/11/2021   CHOLHDL 2.5 11/11/2021     Controlled, no change in medication

## 2022-02-26 NOTE — Assessment & Plan Note (Signed)
  Patient re-educated about  the importance of commitment to a  minimum of 150 minutes of exercise per week as able.  The importance of healthy food choices with portion control discussed, as well as eating regularly and within a 12 hour window most days. The need to choose "clean , green" food 50 to 75% of the time is discussed, as well as to make water the primary drink and set a goal of 64 ounces water daily.       02/24/2022    8:35 AM 11/11/2021    9:54 AM 08/02/2021    9:26 AM  Weight /BMI  Weight 211 lb 6.4 oz 203 lb 0.6 oz 209 lb 1.9 oz  Height 5\' 3"  (1.6 m) 5\' 3"  (1.6 m) 5\' 3"  (1.6 m)  BMI 37.45 kg/m2 35.97 kg/m2 37.04 kg/m2   detreriorated

## 2022-02-26 NOTE — Assessment & Plan Note (Signed)
Brittany Maxwell is reminded of the importance of commitment to daily physical activity for 30 minutes or more, as able and the need to limit carbohydrate intake to 30 to 60 grams per meal to help with blood sugar control.   The need to take medication as prescribed, test blood sugar as directed, and to call between visits if there is a concern that blood sugar is uncontrolled is also discussed.   Brittany Maxwell is reminded of the importance of daily foot exam, annual eye examination, and good blood sugar, blood pressure and cholesterol control. improved     Latest Ref Rng & Units 11/11/2021   11:03 AM 08/02/2021   10:10 AM 03/09/2021   11:35 AM 03/08/2021    9:03 AM 10/29/2020    8:31 AM  Diabetic Labs  HbA1c 4.8 - 5.6 % 6.4   6.9   6.7    6.4    Chol 100 - 199 mg/dL 136   185     173    HDL >39 mg/dL 54   58     57    Calc LDL 0 - 99 mg/dL 59   109     90    Triglycerides 0 - 149 mg/dL 130   101     150    Creatinine 0.57 - 1.00 mg/dL 1.23   1.14    1.08   1.30        02/24/2022    8:35 AM 11/11/2021    9:54 AM 08/02/2021    9:26 AM 03/09/2021    9:38 AM 03/09/2021    9:22 AM 11/09/2020    7:54 AM 08/12/2020    3:39 PM  BP/Weight  Systolic BP [id number]  Diastolic BP 86 84 85 84 97 92 91  Wt. (Lbs) 211.4 203.04 209.12  207.12 198 208  BMI 37.45 kg/m2 35.97 kg/m2 37.04 kg/m2  36.69 kg/m2 33.99 kg/m2 35.7 kg/m2      Latest Ref Rng & Units 09/29/2021   12:00 AM 03/20/2018    2:40 PM  Foot/eye exam completion dates  Eye Exam No Retinopathy No Retinopathy        Foot Form Completion   Done     This result is from an external source.

## 2022-02-26 NOTE — Assessment & Plan Note (Signed)
Hyperlipidemia:Low fat diet discussed and encouraged.   Lipid Panel  Lab Results  Component Value Date   CHOL 136 11/11/2021   HDL 54 11/11/2021   LDLCALC 59 11/11/2021   TRIG 130 11/11/2021   CHOLHDL 2.5 11/11/2021     Controlled, no change in medication

## 2022-03-08 ENCOUNTER — Institutional Professional Consult (permissible substitution): Payer: BC Managed Care – PPO | Admitting: Plastic Surgery

## 2022-03-10 ENCOUNTER — Other Ambulatory Visit: Payer: Self-pay | Admitting: Family Medicine

## 2022-04-03 ENCOUNTER — Other Ambulatory Visit: Payer: Self-pay | Admitting: Family Medicine

## 2022-04-07 ENCOUNTER — Other Ambulatory Visit: Payer: Self-pay | Admitting: Family Medicine

## 2022-04-07 DIAGNOSIS — M545 Low back pain, unspecified: Secondary | ICD-10-CM | POA: Diagnosis not present

## 2022-04-07 DIAGNOSIS — E119 Type 2 diabetes mellitus without complications: Secondary | ICD-10-CM | POA: Diagnosis not present

## 2022-04-07 DIAGNOSIS — M5416 Radiculopathy, lumbar region: Secondary | ICD-10-CM | POA: Diagnosis not present

## 2022-04-07 DIAGNOSIS — Z79891 Long term (current) use of opiate analgesic: Secondary | ICD-10-CM | POA: Diagnosis not present

## 2022-04-07 DIAGNOSIS — G5603 Carpal tunnel syndrome, bilateral upper limbs: Secondary | ICD-10-CM | POA: Diagnosis not present

## 2022-04-11 ENCOUNTER — Other Ambulatory Visit: Payer: Self-pay | Admitting: Family Medicine

## 2022-04-17 ENCOUNTER — Other Ambulatory Visit: Payer: Self-pay | Admitting: Family Medicine

## 2022-05-05 ENCOUNTER — Other Ambulatory Visit: Payer: Self-pay | Admitting: Family Medicine

## 2022-05-07 ENCOUNTER — Other Ambulatory Visit: Payer: Self-pay | Admitting: Family Medicine

## 2022-05-24 DIAGNOSIS — I10 Essential (primary) hypertension: Secondary | ICD-10-CM | POA: Diagnosis not present

## 2022-05-24 DIAGNOSIS — E559 Vitamin D deficiency, unspecified: Secondary | ICD-10-CM | POA: Diagnosis not present

## 2022-05-24 DIAGNOSIS — E1159 Type 2 diabetes mellitus with other circulatory complications: Secondary | ICD-10-CM | POA: Diagnosis not present

## 2022-05-25 LAB — CBC
Hematocrit: 35.9 % (ref 34.0–46.6)
Hemoglobin: 12 g/dL (ref 11.1–15.9)
MCH: 27.8 pg (ref 26.6–33.0)
MCHC: 33.4 g/dL (ref 31.5–35.7)
MCV: 83 fL (ref 79–97)
Platelets: 371 10*3/uL (ref 150–450)
RBC: 4.31 x10E6/uL (ref 3.77–5.28)
RDW: 14.8 % (ref 11.7–15.4)
WBC: 8 10*3/uL (ref 3.4–10.8)

## 2022-05-25 LAB — BMP8+EGFR
BUN/Creatinine Ratio: 11 (ref 9–23)
BUN: 14 mg/dL (ref 6–24)
CO2: 26 mmol/L (ref 20–29)
Calcium: 9.9 mg/dL (ref 8.7–10.2)
Chloride: 100 mmol/L (ref 96–106)
Creatinine, Ser: 1.33 mg/dL — ABNORMAL HIGH (ref 0.57–1.00)
Glucose: 114 mg/dL — ABNORMAL HIGH (ref 70–99)
Potassium: 3.6 mmol/L (ref 3.5–5.2)
Sodium: 141 mmol/L (ref 134–144)
eGFR: 47 mL/min/{1.73_m2} — ABNORMAL LOW (ref >59)

## 2022-05-25 LAB — VITAMIN D 25 HYDROXY (VIT D DEFICIENCY, FRACTURES): Vit D, 25-Hydroxy: 32.4 ng/mL (ref 30.0–100.0)

## 2022-05-25 LAB — HEMOGLOBIN A1C
Est. average glucose Bld gHb Est-mCnc: 140 mg/dL
Hgb A1c MFr Bld: 6.5 % — ABNORMAL HIGH (ref 4.8–5.6)

## 2022-05-25 LAB — TSH: TSH: 2.13 u[IU]/mL (ref 0.450–4.500)

## 2022-05-26 ENCOUNTER — Encounter: Payer: Self-pay | Admitting: Family Medicine

## 2022-05-26 ENCOUNTER — Ambulatory Visit: Payer: BC Managed Care – PPO | Admitting: Family Medicine

## 2022-05-26 VITALS — BP 124/84 | HR 85 | Ht 63.0 in | Wt 208.1 lb

## 2022-05-26 DIAGNOSIS — I10 Essential (primary) hypertension: Secondary | ICD-10-CM | POA: Diagnosis not present

## 2022-05-26 DIAGNOSIS — E669 Obesity, unspecified: Secondary | ICD-10-CM | POA: Diagnosis not present

## 2022-05-26 DIAGNOSIS — L729 Follicular cyst of the skin and subcutaneous tissue, unspecified: Secondary | ICD-10-CM

## 2022-05-26 DIAGNOSIS — E785 Hyperlipidemia, unspecified: Secondary | ICD-10-CM

## 2022-05-26 DIAGNOSIS — L089 Local infection of the skin and subcutaneous tissue, unspecified: Secondary | ICD-10-CM

## 2022-05-26 DIAGNOSIS — J309 Allergic rhinitis, unspecified: Secondary | ICD-10-CM

## 2022-05-26 DIAGNOSIS — Z23 Encounter for immunization: Secondary | ICD-10-CM

## 2022-05-26 DIAGNOSIS — F324 Major depressive disorder, single episode, in partial remission: Secondary | ICD-10-CM

## 2022-05-26 DIAGNOSIS — E1159 Type 2 diabetes mellitus with other circulatory complications: Secondary | ICD-10-CM

## 2022-05-26 MED ORDER — TIRZEPATIDE 5 MG/0.5ML ~~LOC~~ SOAJ: 5.0000 mg | SUBCUTANEOUS | 0 refills | Status: DC

## 2022-05-26 MED ORDER — CEPHALEXIN 500 MG PO CAPS: 500.0000 mg | ORAL_CAPSULE | Freq: Four times a day (QID) | ORAL | 0 refills | Status: DC

## 2022-05-26 MED ORDER — FLUTICASONE PROPIONATE 50 MCG/ACT NA SUSP: 2.0000 | Freq: Every day | NASAL | 6 refills | Status: DC

## 2022-05-26 MED ORDER — AZELASTINE HCL 0.1 % NA SOLN: 2.0000 | Freq: Two times a day (BID) | NASAL | 12 refills | Status: DC

## 2022-05-26 NOTE — Patient Instructions (Addendum)
Annual exam with pap first week in December, call if you need me sooner  Flu vaccine today    New higher dose of mounjaro 0.57m weekly and stop  metformin  It is important that you exercise regularly at least 30 minutes 5 times a week. If you develop chest pain, have severe difficulty breathing, or feel very tired, stop exercising immediately and seek medical attention   Non fasting hBA1C, chem 7 and EGFR 3 days before next appointment  Keflex is prescribed for left ear pain and tenderness call back if persists  New for allergies, flonase and astellin  Thanks for choosing [facility], we consider it a privelige to serve you.

## 2022-05-28 ENCOUNTER — Encounter: Payer: Self-pay | Admitting: Family Medicine

## 2022-05-28 DIAGNOSIS — L089 Local infection of the skin and subcutaneous tissue, unspecified: Secondary | ICD-10-CM | POA: Insufficient documentation

## 2022-05-28 DIAGNOSIS — J309 Allergic rhinitis, unspecified: Secondary | ICD-10-CM | POA: Insufficient documentation

## 2022-05-28 NOTE — Assessment & Plan Note (Signed)
Controlled, no change in medication DASH diet and commitment to daily physical activity for a minimum of 30 minutes discussed and encouraged, as a part of hypertension management. The importance of attaining a healthy weight is also discussed.     05/26/2022   11:14 AM 05/26/2022   10:50 AM 02/24/2022    8:35 AM 11/11/2021    9:54 AM 08/02/2021    9:26 AM 03/09/2021    9:38 AM 03/09/2021    9:22 AM  BP/Weight  Systolic BP 124 136 132 128 131 124 148  Diastolic BP 84 89 86 84 85 84 97  Wt. (Lbs)  208.12 211.4 203.04 209.12  207.12  BMI  36.87 kg/m2 37.45 kg/m2 35.97 kg/m2 37.04 kg/m2  36.69 kg/m2

## 2022-05-28 NOTE — Assessment & Plan Note (Signed)
  Patient re-educated about  the importance of commitment to a  minimum of 150 minutes of exercise per week as able.  The importance of healthy food choices with portion control discussed, as well as eating regularly and within a 12 hour window most days. The need to choose "clean , green" food 50 to 75% of the time is discussed, as well as to make water the primary drink and set a goal of 64 ounces water daily.       05/26/2022   10:50 AM 02/24/2022    8:35 AM 11/11/2021    9:54 AM  Weight /BMI  Weight 208 lb 1.9 oz 211 lb 6.4 oz 203 lb 0.6 oz  Height 5\' 3"  (1.6 m) 5\' 3"  (1.6 m) 5\' 3"  (1.6 m)  BMI 36.87 kg/m2 37.45 kg/m2 35.97 kg/m2

## 2022-05-28 NOTE — Assessment & Plan Note (Signed)
Keflex x 5 days prescribed

## 2022-05-28 NOTE — Assessment & Plan Note (Signed)
Hyperlipidemia:Low fat diet discussed and encouraged.   Lipid Panel  Lab Results  Component Value Date   CHOL 136 11/11/2021   HDL 54 11/11/2021   LDLCALC 59 11/11/2021   TRIG 130 11/11/2021   CHOLHDL 2.5 11/11/2021   Controlled, no change in medication Updated lab needed at/ before next visit.

## 2022-05-28 NOTE — Progress Notes (Unsigned)
Brittany Maxwell     MRN: [id number]      DOB: 06-Jul-1966   HPI [name] is here for follow up and re-evaluation of chronic medical conditions, medication management and review of any available recent lab and radiology data.  Preventive health is updated, specifically  Cancer screening and Immunization.   Questions or concerns regarding consultations or procedures which the PT has had in the interim are  addressed. The PT denies any adverse reactions to current medications since the last visit.  Increased nasal and sinus congestion and clear drainage for past 3 to 4 weeks, no fever. Left ear pain and tender swelling inside x 1 week, no change in hearing  Frustrated with weight but not giving up  ROS Denies recent fever or chills. . Denies chest congestion, productive cough or wheezing. Denies chest pains, palpitations and leg swelling Denies abdominal pain, nausea, vomiting,diarrhea or constipation.   Denies dysuria, frequency, hesitancy or incontinence. Denies  uncontroled joint pain, swelling and limitation in mobility. Denies headaches, seizures, numbness, or tingling. Denies depression, anxiety or insomnia. Denies skin break down or rash.   PE  BP 124/84   Pulse 85   Ht 5\' 3"  (1.6 m)   Wt 208 lb 1.9 oz (94.4 kg)   SpO2 96%   BMI 36.87 kg/m   Patient alert and oriented and in no cardiopulmonary distress.  HEENT: No facial asymmetry, EOMI,     Neck supple .o sinus tendermness, positive nasal congestion, small tender cyst in left outer ear canal, TM clear bilaterally  Chest: Clear to auscultation bilaterally.  CVS: S1, S2 no murmurs, no S3.Regular rate.  ABD: Soft non tender.   Ext: No edema  MS: Adequate though reduced  ROM spine, shoulders, hips and knees.  Skin: Intact, no ulcerations or rash noted.  Psych: Good eye contact, normal affect. Memory intact not anxious or depressed appearing.  CNS: CN 2-12 intact, power,  normal throughout.no focal deficits  noted.   Assessment & Plan  Essential hypertension, benign Controlled, no change in medication DASH diet and commitment to daily physical activity for a minimum of 30 minutes discussed and encouraged, as a part of hypertension management. The importance of attaining a healthy weight is also discussed.     05/26/2022   11:14 AM 05/26/2022   10:50 AM 02/24/2022    8:35 AM 11/11/2021    9:54 AM 08/02/2021    9:26 AM 03/09/2021    9:38 AM 03/09/2021    9:22 AM  BP/Weight  Systolic BP 124 136 132 128 131 124 148  Diastolic BP 84 89 86 84 85 84 97  Wt. (Lbs)  208.12 211.4 203.04 209.12  207.12  BMI  36.87 kg/m2 37.45 kg/m2 35.97 kg/m2 37.04 kg/m2  36.69 kg/m2       Type 2 diabetes mellitus with vascular disease (HCC) Deteriorated , inc mounjaro dose DASH diet and commitment to daily physical activity for a minimum of 30 minutes discussed and encouraged, as a part of hypertension management. The importance of attaining a healthy weight is also discussed.     05/26/2022   11:14 AM 05/26/2022   10:50 AM 02/24/2022    8:35 AM 11/11/2021    9:54 AM 08/02/2021    9:26 AM 03/09/2021    9:38 AM 03/09/2021    9:22 AM  BP/Weight  Systolic BP 124 136 132 128 131 124 148  Diastolic BP 84 89 86 84 85 84 97  Wt. (Lbs)  208.12 211.4  203.04 209.12  207.12  BMI  36.87 kg/m2 37.45 kg/m2 35.97 kg/m2 37.04 kg/m2  36.69 kg/m2       Obesity (BMI 30.0-34.9)  Patient re-educated about  the importance of commitment to a  minimum of 150 minutes of exercise per week as able.  The importance of healthy food choices with portion control discussed, as well as eating regularly and within a 12 hour window most days. The need to choose "clean , green" food 50 to 75% of the time is discussed, as well as to make water the primary drink and set a goal of 64 ounces water daily.       05/26/2022   10:50 AM 02/24/2022    8:35 AM 11/11/2021    9:54 AM  Weight /BMI  Weight 208 lb 1.9 oz 211 lb 6.4 oz 203 lb 0.6 oz   Height 5\' 3"  (1.6 m) 5\' 3"  (1.6 m) 5\' 3"  (1.6 m)  BMI 36.87 kg/m2 37.45 kg/m2 35.97 kg/m2      Hyperlipidemia LDL goal <100 Hyperlipidemia:Low fat diet discussed and encouraged.   Lipid Panel  Lab Results  Component Value Date   CHOL 136 11/11/2021   HDL 54 11/11/2021   LDLCALC 59 11/11/2021   TRIG 130 11/11/2021   CHOLHDL 2.5 11/11/2021   Controlled, no change in medication Updated lab needed at/ before next visit.     Major depression in partial remission (HCC) Controlled, no change in medication   Allergic rhinosinusitis Current  Flare, uncontrolled, commit to daily astellin and flonase  Infected cyst of skin Keflex x 5 days prescribed

## 2022-05-28 NOTE — Assessment & Plan Note (Signed)
Controlled, no change in medication  

## 2022-05-28 NOTE — Assessment & Plan Note (Signed)
Current  Flare, uncontrolled, commit to daily astellin and flonase

## 2022-05-28 NOTE — Assessment & Plan Note (Signed)
Deteriorated , inc mounjaro dose DASH diet and commitment to daily physical activity for a minimum of 30 minutes discussed and encouraged, as a part of hypertension management. The importance of attaining a healthy weight is also discussed.     05/26/2022   11:14 AM 05/26/2022   10:50 AM 02/24/2022    8:35 AM 11/11/2021    9:54 AM 08/02/2021    9:26 AM 03/09/2021    9:38 AM 03/09/2021    9:22 AM  BP/Weight  Systolic BP 124 136 132 128 131 124 148  Diastolic BP 84 89 86 84 85 84 97  Wt. (Lbs)  208.12 211.4 203.04 209.12  207.12  BMI  36.87 kg/m2 37.45 kg/m2 35.97 kg/m2 37.04 kg/m2  36.69 kg/m2

## 2022-06-02 ENCOUNTER — Ambulatory Visit (HOSPITAL_COMMUNITY)
Admission: RE | Admit: 2022-06-02 | Discharge: 2022-06-02 | Disposition: A | Discharge status: Home / Self Care | Payer: BC Managed Care – PPO | Source: Ambulatory Visit | Attending: Family Medicine | Admitting: Family Medicine

## 2022-06-02 DIAGNOSIS — Z1231 Encounter for screening mammogram for malignant neoplasm of breast: Secondary | ICD-10-CM | POA: Diagnosis not present

## 2022-06-06 ENCOUNTER — Other Ambulatory Visit: Payer: Self-pay | Admitting: Family Medicine

## 2022-06-07 ENCOUNTER — Ambulatory Visit (HOSPITAL_COMMUNITY): Payer: BC Managed Care – PPO

## 2022-06-12 ENCOUNTER — Other Ambulatory Visit: Payer: Self-pay | Admitting: Family Medicine

## 2022-06-20 ENCOUNTER — Institutional Professional Consult (permissible substitution): Payer: BC Managed Care – PPO | Admitting: Plastic Surgery

## 2022-06-29 ENCOUNTER — Other Ambulatory Visit: Payer: Self-pay | Admitting: Family Medicine

## 2022-06-30 ENCOUNTER — Telehealth: Payer: Self-pay | Admitting: Family Medicine

## 2022-06-30 DIAGNOSIS — Z79891 Long term (current) use of opiate analgesic: Secondary | ICD-10-CM | POA: Diagnosis not present

## 2022-06-30 DIAGNOSIS — G5603 Carpal tunnel syndrome, bilateral upper limbs: Secondary | ICD-10-CM | POA: Diagnosis not present

## 2022-06-30 DIAGNOSIS — M545 Low back pain, unspecified: Secondary | ICD-10-CM | POA: Diagnosis not present

## 2022-06-30 DIAGNOSIS — M5416 Radiculopathy, lumbar region: Secondary | ICD-10-CM | POA: Diagnosis not present

## 2022-06-30 NOTE — Telephone Encounter (Signed)
Please advise 

## 2022-06-30 NOTE — Telephone Encounter (Signed)
Pt called stating since [facility] is closing down she is wanting to transfer her care to [facility] in [place].       [facility], fx # [phone]

## 2022-07-03 ENCOUNTER — Other Ambulatory Visit: Payer: Self-pay

## 2022-07-03 DIAGNOSIS — G8929 Other chronic pain: Secondary | ICD-10-CM

## 2022-07-03 NOTE — Telephone Encounter (Signed)
Referral has been put in

## 2022-07-12 ENCOUNTER — Other Ambulatory Visit: Payer: Self-pay | Admitting: Family Medicine

## 2022-08-07 ENCOUNTER — Other Ambulatory Visit: Payer: Self-pay | Admitting: Family Medicine

## 2022-08-08 ENCOUNTER — Encounter: Payer: Self-pay | Admitting: Family Medicine

## 2022-08-08 ENCOUNTER — Other Ambulatory Visit (HOSPITAL_COMMUNITY)
Admission: RE | Admit: 2022-08-08 | Discharge: 2022-08-08 | Disposition: A | Discharge status: Home / Self Care | Payer: BC Managed Care – PPO | Source: Ambulatory Visit | Attending: Family Medicine | Admitting: Family Medicine

## 2022-08-08 ENCOUNTER — Ambulatory Visit (INDEPENDENT_AMBULATORY_CARE_PROVIDER_SITE_OTHER): Payer: BC Managed Care – PPO | Admitting: Family Medicine

## 2022-08-08 ENCOUNTER — Ambulatory Visit: Payer: BC Managed Care – PPO | Admitting: Family Medicine

## 2022-08-08 VITALS — BP 130/88 | HR 73 | Ht 63.0 in | Wt 207.0 lb

## 2022-08-08 DIAGNOSIS — Z124 Encounter for screening for malignant neoplasm of cervix: Secondary | ICD-10-CM | POA: Diagnosis not present

## 2022-08-08 DIAGNOSIS — E785 Hyperlipidemia, unspecified: Secondary | ICD-10-CM

## 2022-08-08 DIAGNOSIS — Z79891 Long term (current) use of opiate analgesic: Secondary | ICD-10-CM | POA: Diagnosis not present

## 2022-08-08 DIAGNOSIS — E559 Vitamin D deficiency, unspecified: Secondary | ICD-10-CM | POA: Diagnosis not present

## 2022-08-08 DIAGNOSIS — Z0001 Encounter for general adult medical examination with abnormal findings: Secondary | ICD-10-CM | POA: Diagnosis not present

## 2022-08-08 DIAGNOSIS — I10 Essential (primary) hypertension: Secondary | ICD-10-CM

## 2022-08-08 DIAGNOSIS — E1159 Type 2 diabetes mellitus with other circulatory complications: Secondary | ICD-10-CM

## 2022-08-08 DIAGNOSIS — M5416 Radiculopathy, lumbar region: Secondary | ICD-10-CM | POA: Diagnosis not present

## 2022-08-08 NOTE — Patient Instructions (Addendum)
F/U in 4 months, call if you need me sooner  Please schedule wellness visit at checkout   Please get Covid vaccine   Fasting lipid, cmp and eGFR and hBA1C first week in December and microalb  Pap sent today  It is important that you exercise regularly at least 30 minutes 5 times a week. If you develop chest pain, have severe difficulty breathing, or feel very tired, stop exercising immediately and seek medical attention   Think about what you will eat, plan ahead. Choose " clean, green, fresh or frozen" over canned, processed or packaged foods which are more sugary, salty and fatty. 70 to 75% of food eaten should be vegetables and fruit. Three meals at set times with snacks allowed between meals, but they must be fruit or vegetables. Aim to eat over a 12 hour period , example 7 am to 7 pm, and STOP after  your last meal of the day. Drink water,generally about 64 ounces per day, no other drink is as healthy. Fruit juice is best enjoyed in a healthy way, by EATING the fruit. Thanks for choosing [facility], we consider it a privelige to serve you.  Thanks for choosing [facility], we consider it a privelige to serve you.

## 2022-08-11 ENCOUNTER — Encounter: Payer: Self-pay | Admitting: Family Medicine

## 2022-08-11 DIAGNOSIS — Z0001 Encounter for general adult medical examination with abnormal findings: Secondary | ICD-10-CM | POA: Insufficient documentation

## 2022-08-11 LAB — CYTOLOGY - PAP: Diagnosis: NEGATIVE

## 2022-08-11 NOTE — Assessment & Plan Note (Signed)

## 2022-08-11 NOTE — Progress Notes (Signed)
    Brittany Maxwell     MRN: [id number]      DOB: Nov 11, 1965  HPI: Patient is in for annual physical exam. No other health concerns are expressed or addressed at the visit. Recent labs,  are reviewed. Immunization is reviewed , and  updated if needed.   PE: BP 130/88   Pulse 73   Ht 5\' 3"  (1.6 m)   Wt 207 lb (93.9 kg)   SpO2 96%   BMI 36.67 kg/m   Pleasant  female, alert and oriented x 3, in no cardio-pulmonary distress. Afebrile. HEENT No facial trauma or asymetry. Sinuses non tender.  Extra occullar muscles intact.. External ears normal, . Neck: supple, no adenopathy,JVD or thyromegaly.No bruits.  Chest: Clear to ascultation bilaterally.No crackles or wheezes. Non tender to palpation  Breast: No asymetry,no masses or lumps. No tenderness. No nipple discharge or inversion. No axillary or supraclavicular adenopathy  Cardiovascular system; Heart sounds normal,  S1 and  S2 ,no S3.  No murmur, or thrill. Apical beat not displaced Peripheral pulses normal.  Abdomen: Soft, non tender, no organomegaly or masses. No bruits. Bowel sounds normal. No guarding, tenderness or rebound.   GU: External genitalia normal female genitalia , normal female distribution of hair. No lesions. Urethral meatus normal in size, no  Prolapse, no lesions visibly  Present. Bladder non tender. Vagina pink and moist , with no visible lesions , discharge present . Adequate pelvic support no  cystocele or rectocele noted Cervix pink and appears healthy, no lesions or ulcerations noted, no discharge noted from os Uterus normal size, no adnexal masses, no cervical motion or adnexal tenderness.   Musculoskeletal exam: Full ROM of spine, hips , shoulders and knees. No deformity ,swelling or crepitus noted. No muscle wasting or atrophy.   Neurologic: Cranial nerves 2 to 12 intact. Power, tone ,sensation and reflexes normal throughout. No disturbance in gait. No tremor.  Skin: Intact, no  ulceration, erythema , scaling or rash noted. Pigmentation normal throughout  Psych; Normal mood and affect. Judgement and concentration normal   Assessment & Plan:  Annual visit for general adult medical examination with abnormal findings Annual exam as documented. Counseling done  re healthy lifestyle involving commitment to 150 minutes exercise per week, heart healthy diet, and attaining healthy weight.The importance of adequate sleep also discussed. Regular seat belt use and home safety, is also discussed. Changes in health habits are decided on by the patient with goals and time frames  set for achieving them. Immunization and cancer screening needs are specifically addressed at this visit.

## 2022-08-22 ENCOUNTER — Other Ambulatory Visit: Payer: Self-pay | Admitting: Family Medicine

## 2022-09-04 ENCOUNTER — Other Ambulatory Visit: Payer: Self-pay | Admitting: Family Medicine

## 2022-09-05 ENCOUNTER — Encounter: Payer: BC Managed Care – PPO | Admitting: Family Medicine

## 2022-09-19 DIAGNOSIS — J019 Acute sinusitis, unspecified: Secondary | ICD-10-CM | POA: Diagnosis not present

## 2022-09-19 DIAGNOSIS — R051 Acute cough: Secondary | ICD-10-CM | POA: Diagnosis not present

## 2022-09-22 ENCOUNTER — Other Ambulatory Visit: Payer: Self-pay | Admitting: Family Medicine

## 2022-09-26 ENCOUNTER — Other Ambulatory Visit: Payer: Self-pay | Admitting: Family Medicine

## 2022-09-28 MED ORDER — POTASSIUM CHLORIDE CRYS ER 20 MEQ PO TBCR: 20.0000 meq | EXTENDED_RELEASE_TABLET | Freq: Every day | ORAL | 1 refills | Status: DC

## 2022-09-28 NOTE — Addendum Note (Signed)
Addended by: Syliva Overman E on: 09/28/2022 01:08 PM   Modules accepted: Orders

## 2022-10-10 ENCOUNTER — Other Ambulatory Visit: Payer: Self-pay | Admitting: Family Medicine

## 2022-11-06 ENCOUNTER — Other Ambulatory Visit: Payer: Self-pay | Admitting: Family Medicine

## 2022-11-14 ENCOUNTER — Other Ambulatory Visit: Payer: Self-pay | Admitting: Family Medicine

## 2022-11-14 DIAGNOSIS — M5416 Radiculopathy, lumbar region: Secondary | ICD-10-CM | POA: Diagnosis not present

## 2022-11-14 DIAGNOSIS — Z1330 Encounter for screening examination for mental health and behavioral disorders, unspecified: Secondary | ICD-10-CM | POA: Diagnosis not present

## 2022-11-14 DIAGNOSIS — G5603 Carpal tunnel syndrome, bilateral upper limbs: Secondary | ICD-10-CM | POA: Diagnosis not present

## 2022-11-14 DIAGNOSIS — Z79891 Long term (current) use of opiate analgesic: Secondary | ICD-10-CM | POA: Diagnosis not present

## 2022-11-22 ENCOUNTER — Other Ambulatory Visit: Payer: Self-pay | Admitting: Family Medicine

## 2022-12-05 ENCOUNTER — Other Ambulatory Visit: Payer: Self-pay | Admitting: Family Medicine

## 2022-12-07 ENCOUNTER — Ambulatory Visit: Payer: BC Managed Care – PPO | Admitting: Family Medicine

## 2022-12-12 ENCOUNTER — Encounter: Payer: Self-pay | Admitting: Family Medicine

## 2022-12-12 ENCOUNTER — Ambulatory Visit: Payer: BC Managed Care – PPO | Admitting: Family Medicine

## 2022-12-12 VITALS — BP 127/85 | HR 77 | Ht 63.0 in | Wt 203.0 lb

## 2022-12-12 DIAGNOSIS — G8929 Other chronic pain: Secondary | ICD-10-CM

## 2022-12-12 DIAGNOSIS — Z0100 Encounter for examination of eyes and vision without abnormal findings: Secondary | ICD-10-CM

## 2022-12-12 DIAGNOSIS — I10 Essential (primary) hypertension: Secondary | ICD-10-CM | POA: Diagnosis not present

## 2022-12-12 DIAGNOSIS — E1159 Type 2 diabetes mellitus with other circulatory complications: Secondary | ICD-10-CM

## 2022-12-12 DIAGNOSIS — Z1231 Encounter for screening mammogram for malignant neoplasm of breast: Secondary | ICD-10-CM

## 2022-12-12 DIAGNOSIS — E785 Hyperlipidemia, unspecified: Secondary | ICD-10-CM

## 2022-12-12 DIAGNOSIS — E559 Vitamin D deficiency, unspecified: Secondary | ICD-10-CM | POA: Diagnosis not present

## 2022-12-12 DIAGNOSIS — E669 Obesity, unspecified: Secondary | ICD-10-CM

## 2022-12-12 DIAGNOSIS — M5440 Lumbago with sciatica, unspecified side: Secondary | ICD-10-CM

## 2022-12-12 NOTE — Progress Notes (Signed)
Brittany Maxwell     MRN: [id number]      DOB: 1966/07/05   HPI Brittany Maxwell is here for follow up and re-evaluation of chronic medical conditions, medication management and review of any available recent lab and radiology data.  Preventive health is updated, specifically  Cancer screening and Immunization.   Questions or concerns regarding consultations or procedures which the PT has had in the interim are  addressed. The PT denies any adverse reactions to current medications since the last visit.  There are no new concerns.  There are no specific complaints   ROS Denies recent fever or chills. Denies sinus pressure, nasal congestion, ear pain or sore throat. Denies chest congestion, productive cough or wheezing. Denies chest pains, palpitations and leg swelling Denies abdominal pain, nausea, vomiting,diarrhea or constipation.   Denies dysuria, frequency, hesitancy or incontinence. Denies joint pain, swelling and limitation in mobility. Denies headaches, seizures, numbness, or tingling. Denies depression, anxiety or insomnia. Denies skin break down or rash.   PE  BP 127/85 (BP Location: Right Arm, Patient Position: Sitting, Cuff Size: Large)   Pulse 77   Ht 5\' 3"  (1.6 m)   Wt 203 lb 0.6 oz (92.1 kg)   SpO2 93%   BMI 35.97 kg/m   Patient alert and oriented and in no cardiopulmonary distress.  HEENT: No facial asymmetry, EOMI,     Neck supple .  Chest: Clear to auscultation bilaterally.  CVS: S1, S2 no murmurs, no S3.Regular rate.  ABD: Soft non tender.   Ext: No edema  MS: Adequate though reduced  ROM spine, shoulders, hips and knees.  Skin: Intact, no ulcerations or rash noted.  Psych: Good eye contact, normal affect. Memory intact not anxious or depressed appearing.  CNS: CN 2-12 intact, power,  normal throughout.no focal deficits noted.   Assessment & Plan  Essential hypertension, benign Controlled, no change in medication DASH diet and commitment to  daily physical activity for a minimum of 30 minutes discussed and encouraged, as a part of hypertension management. The importance of attaining a healthy weight is also discussed.     12/12/2022    8:06 AM 08/08/2022   11:55 AM 08/08/2022   11:16 AM 05/26/2022   11:14 AM 05/26/2022   10:50 AM 02/24/2022    8:35 AM 11/11/2021    9:54 AM  BP/Weight  Systolic BP [id number] [id number]  Diastolic BP 85 88 87 84 89 86 84  Wt. (Lbs) 203.04  207  208.12 211.4 203.04  BMI 35.97 kg/m2  36.67 kg/m2  36.87 kg/m2 37.45 kg/m2 35.97 kg/m2       Type 2 diabetes mellitus with vascular disease ([place]) Brittany Maxwell is reminded of the importance of commitment to daily physical activity for 30 minutes or more, as able and the need to limit carbohydrate intake to 30 to 60 grams per meal to help with blood sugar control.   The need to take medication as prescribed, test blood sugar as directed, and to call between visits if there is a concern that blood sugar is uncontrolled is also discussed.   Brittany Maxwell is reminded of the importance of daily foot exam, annual eye examination, and good blood sugar, blood pressure and cholesterol control.     Latest Ref Rng & Units 12/12/2022    9:17 AM 05/24/2022    9:50 AM 11/11/2021   11:03 AM 08/02/2021   10:10 AM 03/09/2021   11:35 AM  Diabetic Labs  HbA1c 4.8 - 5.6 % 6.4  6.5  6.4  6.9  6.7   Micro/Creat Ratio 0 - 29 mg/g creat 11       Chol 100 - 199 mg/dL 203   136  185    HDL >39 mg/dL 52   54  58    Calc LDL 0 - 99 mg/dL 132   59  109    Triglycerides 0 - 149 mg/dL 106   130  101    Creatinine 0.57 - 1.00 mg/dL 1.36  1.33  1.23  1.14        12/12/2022    8:06 AM 08/08/2022   11:55 AM 08/08/2022   11:16 AM 05/26/2022   11:14 AM 05/26/2022   10:50 AM 02/24/2022    8:35 AM 11/11/2021    9:54 AM  BP/Weight  Systolic BP [id number] 0000000  Diastolic BP 85 88 87 84 89 86 84  Wt. (Lbs) 203.04  207  208.12 211.4 203.04  BMI 35.97 kg/m2  36.67 kg/m2   36.87 kg/m2 37.45 kg/m2 35.97 kg/m2      Latest Ref Rng & Units 08/08/2022   11:00 AM 09/29/2021   12:00 AM  Foot/eye exam completion dates  Eye Exam No Retinopathy  No Retinopathy      Foot Form Completion  Done      This result is from an external source.      improved  Vitamin D insufficiency Adequately corrected continue current med  Obesity (BMI 30.0-34.9)  Patient re-educated about  the importance of commitment to a  minimum of 150 minutes of exercise per week as able.  The importance of healthy food choices with portion control discussed, as well as eating regularly and within a 12 hour window most days. The need to choose "clean , green" food 50 to 75% of the time is discussed, as well as to make water the primary drink and set a goal of 64 ounces water daily.       12/12/2022    8:06 AM 08/08/2022   11:16 AM 05/26/2022   10:50 AM  Weight /BMI  Weight 203 lb 0.6 oz 207 lb 208 lb 1.9 oz  Height 5\' 3"  (1.6 m) 5\' 3"  (1.6 m) 5\' 3"  (1.6 m)  BMI 35.97 kg/m2 36.67 kg/m2 36.87 kg/m2    improved  LOW BACK PAIN Unchanged managed  through pain clinic  Hyperlipidemia LDL goal <100 Hyperlipidemia:Low fat diet discussed and encouraged.   Lipid Panel  Lab Results  Component Value Date   CHOL 203 (H) 12/12/2022   HDL 52 12/12/2022   LDLCALC 132 (H) 12/12/2022   TRIG 106 12/12/2022   CHOLHDL 3.9 12/12/2022     Needs to lower fat intake

## 2022-12-12 NOTE — Patient Instructions (Signed)
F/u in 4 months, call if you need me sooner  Labs today CBC lipid CMP and EGFR HbA1c TSH vitamin D and microalbuminuria  Nurse please refer to Dr. Jorja Loa for annual diabetic eye exam.  Please schedule mammogram at checkout.  The current COVID-vaccine is recommended please get this at your pharmacy.  Congrats on good health habits, keep them up.  Best for 2024!  Thanks for choosing [facility], we consider it a privelige to serve you.

## 2022-12-14 LAB — CMP14+EGFR
ALT: 17 IU/L (ref 0–32)
AST: 19 IU/L (ref 0–40)
Albumin/Globulin Ratio: 1.3 (ref 1.2–2.2)
Albumin: 4.6 g/dL (ref 3.8–4.9)
Alkaline Phosphatase: 113 IU/L (ref 44–121)
BUN/Creatinine Ratio: 5 — ABNORMAL LOW (ref 9–23)
BUN: 7 mg/dL (ref 6–24)
Bilirubin Total: 0.6 mg/dL (ref 0.0–1.2)
CO2: 25 mmol/L (ref 20–29)
Calcium: 9.8 mg/dL (ref 8.7–10.2)
Chloride: 98 mmol/L (ref 96–106)
Creatinine, Ser: 1.36 mg/dL — ABNORMAL HIGH (ref 0.57–1.00)
Globulin, Total: 3.6 g/dL (ref 1.5–4.5)
Glucose: 82 mg/dL (ref 70–99)
Potassium: 3.4 mmol/L — ABNORMAL LOW (ref 3.5–5.2)
Sodium: 139 mmol/L (ref 134–144)
Total Protein: 8.2 g/dL (ref 6.0–8.5)
eGFR: 45 mL/min/{1.73_m2} — ABNORMAL LOW (ref >59)

## 2022-12-14 LAB — MICROALBUMIN / CREATININE URINE RATIO
Creatinine, Urine: 104.7 mg/dL
Microalb/Creat Ratio: 11 mg/g creat (ref 0–29)
Microalbumin, Urine: 11.8 ug/mL

## 2022-12-14 LAB — HEMOGLOBIN A1C
Est. average glucose Bld gHb Est-mCnc: 137 mg/dL
Hgb A1c MFr Bld: 6.4 % — ABNORMAL HIGH (ref 4.8–5.6)

## 2022-12-14 LAB — CBC
Hematocrit: 40.7 % (ref 34.0–46.6)
Hemoglobin: 13 g/dL (ref 11.1–15.9)
MCH: 27.1 pg (ref 26.6–33.0)
MCHC: 31.9 g/dL (ref 31.5–35.7)
MCV: 85 fL (ref 79–97)
Platelets: 461 10*3/uL — ABNORMAL HIGH (ref 150–450)
RBC: 4.79 x10E6/uL (ref 3.77–5.28)
RDW: 14.1 % (ref 11.7–15.4)
WBC: 7.4 10*3/uL (ref 3.4–10.8)

## 2022-12-14 LAB — LIPID PANEL
Chol/HDL Ratio: 3.9 ratio (ref 0.0–4.4)
Cholesterol, Total: 203 mg/dL — ABNORMAL HIGH (ref 100–199)
HDL: 52 mg/dL (ref >39)
LDL Chol Calc (NIH): 132 mg/dL — ABNORMAL HIGH (ref 0–99)
Triglycerides: 106 mg/dL (ref 0–149)
VLDL Cholesterol Cal: 19 mg/dL (ref 5–40)

## 2022-12-14 LAB — TSH: TSH: 2.42 u[IU]/mL (ref 0.450–4.500)

## 2022-12-14 LAB — VITAMIN D 25 HYDROXY (VIT D DEFICIENCY, FRACTURES): Vit D, 25-Hydroxy: 33.9 ng/mL (ref 30.0–100.0)

## 2022-12-18 ENCOUNTER — Encounter: Payer: Self-pay | Admitting: Family Medicine

## 2022-12-18 NOTE — Assessment & Plan Note (Signed)
Controlled, no change in medication DASH diet and commitment to daily physical activity for a minimum of 30 minutes discussed and encouraged, as a part of hypertension management. The importance of attaining a healthy weight is also discussed.     12/12/2022    8:06 AM 08/08/2022   11:55 AM 08/08/2022   11:16 AM 05/26/2022   11:14 AM 05/26/2022   10:50 AM 02/24/2022    8:35 AM 11/11/2021    9:54 AM  BP/Weight  Systolic BP [id number] A999333 A999333 [id number] 0000000  Diastolic BP 85 88 87 84 89 86 84  Wt. (Lbs) 203.04  207  208.12 211.4 203.04  BMI 35.97 kg/m2  36.67 kg/m2  36.87 kg/m2 37.45 kg/m2 35.97 kg/m2

## 2022-12-18 NOTE — Assessment & Plan Note (Signed)
Brittany Maxwell is reminded of the importance of commitment to daily physical activity for 30 minutes or more, as able and the need to limit carbohydrate intake to 30 to 60 grams per meal to help with blood sugar control.   The need to take medication as prescribed, test blood sugar as directed, and to call between visits if there is a concern that blood sugar is uncontrolled is also discussed.   Brittany Maxwell is reminded of the importance of daily foot exam, annual eye examination, and good blood sugar, blood pressure and cholesterol control.     Latest Ref Rng & Units 12/12/2022    9:17 AM 05/24/2022    9:50 AM 11/11/2021   11:03 AM 08/02/2021   10:10 AM 03/09/2021   11:35 AM  Diabetic Labs  HbA1c 4.8 - 5.6 % 6.4  6.5  6.4  6.9  6.7   Micro/Creat Ratio 0 - 29 mg/g creat 11       Chol 100 - 199 mg/dL 203   136  185    HDL >39 mg/dL 52   54  58    Calc LDL 0 - 99 mg/dL 132   59  109    Triglycerides 0 - 149 mg/dL 106   130  101    Creatinine 0.57 - 1.00 mg/dL 1.36  1.33  1.23  1.14        12/12/2022    8:06 AM 08/08/2022   11:55 AM 08/08/2022   11:16 AM 05/26/2022   11:14 AM 05/26/2022   10:50 AM 02/24/2022    8:35 AM 11/11/2021    9:54 AM  BP/Weight  Systolic BP [id number] A999333 A999333 [id number] [id number] 0000000  Diastolic BP 85 88 87 84 89 86 84  Wt. (Lbs) 203.04  207  208.12 211.4 203.04  BMI 35.97 kg/m2  36.67 kg/m2  36.87 kg/m2 37.45 kg/m2 35.97 kg/m2      Latest Ref Rng & Units 08/08/2022   11:00 AM 09/29/2021   12:00 AM  Foot/eye exam completion dates  Eye Exam No Retinopathy  No Retinopathy      Foot Form Completion  Done      This result is from an external source.      improved

## 2022-12-18 NOTE — Assessment & Plan Note (Signed)
Hyperlipidemia:Low fat diet discussed and encouraged.   Lipid Panel  Lab Results  Component Value Date   CHOL 203 (H) 12/12/2022   HDL 52 12/12/2022   LDLCALC 132 (H) 12/12/2022   TRIG 106 12/12/2022   CHOLHDL 3.9 12/12/2022     Needs to lower fat intake

## 2022-12-18 NOTE — Assessment & Plan Note (Signed)
  Patient re-educated about  the importance of commitment to a  minimum of 150 minutes of exercise per week as able.  The importance of healthy food choices with portion control discussed, as well as eating regularly and within a 12 hour window most days. The need to choose "clean , green" food 50 to 75% of the time is discussed, as well as to make water the primary drink and set a goal of 64 ounces water daily.       12/12/2022    8:06 AM 08/08/2022   11:16 AM 05/26/2022   10:50 AM  Weight /BMI  Weight 203 lb 0.6 oz 207 lb 208 lb 1.9 oz  Height 5\' 3"  (1.6 m) 5\' 3"  (1.6 m) 5\' 3"  (1.6 m)  BMI 35.97 kg/m2 36.67 kg/m2 36.87 kg/m2    improved

## 2022-12-18 NOTE — Assessment & Plan Note (Signed)
Unchanged managed  through pain clinic

## 2022-12-18 NOTE — Assessment & Plan Note (Signed)
Adequately corrected continue current med 

## 2022-12-19 ENCOUNTER — Other Ambulatory Visit: Payer: Self-pay | Admitting: Family Medicine

## 2022-12-21 ENCOUNTER — Other Ambulatory Visit: Payer: Self-pay | Admitting: Family Medicine

## 2022-12-23 ENCOUNTER — Other Ambulatory Visit: Payer: Self-pay | Admitting: Family Medicine

## 2023-01-01 ENCOUNTER — Other Ambulatory Visit: Payer: Self-pay | Admitting: Family Medicine

## 2023-01-13 ENCOUNTER — Other Ambulatory Visit: Payer: Self-pay | Admitting: Family Medicine

## 2023-01-16 ENCOUNTER — Institutional Professional Consult (permissible substitution): Payer: BC Managed Care – PPO | Admitting: Plastic Surgery

## 2023-02-08 ENCOUNTER — Other Ambulatory Visit: Payer: Self-pay | Admitting: Family Medicine

## 2023-02-11 ENCOUNTER — Other Ambulatory Visit: Payer: Self-pay | Admitting: Family Medicine

## 2023-02-27 ENCOUNTER — Other Ambulatory Visit: Payer: Self-pay | Admitting: Family Medicine

## 2023-03-20 ENCOUNTER — Other Ambulatory Visit: Payer: Self-pay | Admitting: Family Medicine

## 2023-03-25 ENCOUNTER — Other Ambulatory Visit: Payer: Self-pay | Admitting: Family Medicine

## 2023-03-27 ENCOUNTER — Other Ambulatory Visit: Payer: Self-pay | Admitting: Family Medicine

## 2023-04-17 ENCOUNTER — Encounter: Payer: Self-pay | Admitting: Family Medicine

## 2023-04-17 ENCOUNTER — Ambulatory Visit: Payer: BC Managed Care – PPO | Admitting: Family Medicine

## 2023-04-17 VITALS — BP 120/80 | HR 85 | Ht 63.0 in | Wt 202.0 lb

## 2023-04-17 DIAGNOSIS — E785 Hyperlipidemia, unspecified: Secondary | ICD-10-CM

## 2023-04-17 DIAGNOSIS — E669 Obesity, unspecified: Secondary | ICD-10-CM

## 2023-04-17 DIAGNOSIS — F324 Major depressive disorder, single episode, in partial remission: Secondary | ICD-10-CM | POA: Diagnosis not present

## 2023-04-17 DIAGNOSIS — I10 Essential (primary) hypertension: Secondary | ICD-10-CM | POA: Diagnosis not present

## 2023-04-17 DIAGNOSIS — J309 Allergic rhinitis, unspecified: Secondary | ICD-10-CM

## 2023-04-17 DIAGNOSIS — F5104 Psychophysiologic insomnia: Secondary | ICD-10-CM

## 2023-04-17 DIAGNOSIS — E1159 Type 2 diabetes mellitus with other circulatory complications: Secondary | ICD-10-CM | POA: Diagnosis not present

## 2023-04-17 MED ORDER — AZELASTINE HCL 0.1 % NA SOLN: 2.0000 | Freq: Two times a day (BID) | NASAL | 4 refills | Status: DC

## 2023-04-17 NOTE — Patient Instructions (Signed)
Annual exam in November, call if you need me sooner.  Labs today HbA1c lipid CMP and EGFR.  Please arrange diabetic eye exam through the office when the next year patient had her in the office last year and her exam is due.  Astelin nasal spray is sent in for uncontrolled allergy symptoms causing dry cough and mild left sinus pressure.,  Call if your symptoms persist or worsen.  Please intentionally commit to eating increased vegetables especially the nonstarchy ones which are all vegetables except for potato and also commit to having to stop 2 servings of protein per day at least this could be egg whites lean white meats preferred over red meat.  Please also commit to drinking 64 ounces of water daily.  Congrats on great health habits keep up the good work.

## 2023-04-17 NOTE — Assessment & Plan Note (Signed)
Improved with medication, continue same Sleep hygiene reviewed and written information offered also. Prescription sent for  medication needed.

## 2023-04-17 NOTE — Assessment & Plan Note (Signed)
Uncontrolled currently, astel;lin prescribed

## 2023-04-17 NOTE — Assessment & Plan Note (Signed)
Controlled, no change in medication DASH diet and commitment to daily physical activity for a minimum of 30 minutes discussed and encouraged, as a part of hypertension management. The importance of attaining a healthy weight is also discussed.     04/17/2023    8:21 AM 12/12/2022    8:06 AM 08/08/2022   11:55 AM 08/08/2022   11:16 AM 05/26/2022   11:14 AM 05/26/2022   10:50 AM 02/24/2022    8:35 AM  BP/Weight  Systolic BP 120 127 130 135 124 136 132  Diastolic BP 80 85 88 87 84 89 86  Wt. (Lbs) 202 203.04  207  208.12 211.4  BMI 35.78 kg/m2 35.97 kg/m2  36.67 kg/m2  36.87 kg/m2 37.45 kg/m2

## 2023-04-17 NOTE — Assessment & Plan Note (Signed)
Brittany Maxwell is reminded of the importance of commitment to daily physical activity for 30 minutes or more, as able and the need to limit carbohydrate intake to 30 to 60 grams per meal to help with blood sugar control.   The need to take medication as prescribed, test blood sugar as directed, and to call between visits if there is a concern that blood sugar is uncontrolled is also discussed.   Brittany Maxwell is reminded of the importance of daily foot exam, annual eye examination, and good blood sugar, blood pressure and cholesterol control.     Latest Ref Rng & Units 12/12/2022    9:17 AM 05/24/2022    9:50 AM 11/11/2021   11:03 AM 08/02/2021   10:10 AM 03/09/2021   11:35 AM  Diabetic Labs  HbA1c 4.8 - 5.6 % 6.4  6.5  6.4  6.9  6.7   Micro/Creat Ratio 0 - 29 mg/g creat 11       Chol 100 - 199 mg/dL 161   096  045    HDL >40 mg/dL 52   54  58    Calc LDL 0 - 99 mg/dL 981   59  191    Triglycerides 0 - 149 mg/dL 478   295  621    Creatinine 0.57 - 1.00 mg/dL 3.08  6.57  8.46  9.62        04/17/2023    8:21 AM 12/12/2022    8:06 AM 08/08/2022   11:55 AM 08/08/2022   11:16 AM 05/26/2022   11:14 AM 05/26/2022   10:50 AM 02/24/2022    8:35 AM  BP/Weight  Systolic BP 120 127 130 135 124 136 132  Diastolic BP 80 85 88 87 84 89 86  Wt. (Lbs) 202 203.04  207  208.12 211.4  BMI 35.78 kg/m2 35.97 kg/m2  36.67 kg/m2  36.87 kg/m2 37.45 kg/m2      Latest Ref Rng & Units 08/08/2022   11:00 AM 09/29/2021   12:00 AM  Foot/eye exam completion dates  Eye Exam No Retinopathy  No Retinopathy      Foot Form Completion  Done      This result is from an external source.      Updated lab needed at/ before next visit.

## 2023-04-17 NOTE — Assessment & Plan Note (Signed)
Hyperlipidemia:Low fat diet discussed and encouraged.   Lipid Panel  Lab Results  Component Value Date   CHOL 203 (H) 12/12/2022   HDL 52 12/12/2022   LDLCALC 132 (H) 12/12/2022   TRIG 106 12/12/2022   CHOLHDL 3.9 12/12/2022     Updated lab needed at/ before next visit.

## 2023-04-17 NOTE — Progress Notes (Signed)
Brittany Maxwell     MRN: [id number]      DOB: 1966/02/17  Chief Complaint  Patient presents with   Follow-up    Follow up dry cough since Sunday     HPI Brittany Maxwell is here for follow up and re-evaluation of chronic medical conditions, medication management and review of any available recent lab and radiology data.  Preventive health is updated, specifically  Cancer screening and Immunization.   Questions or concerns regarding consultations or procedures which the PT has had in the interim are  addressed. The PT denies any adverse reactions to current medications since the last visit.  There are no new concerns.  Left maxillary preasasure and dry cough x 3 days, no fever or chills  ROS Denies recent fever or chills. Denies , ear pain or sore throat. Denies chest congestion, productive cough or wheezing. Denies chest pains, palpitations and leg swelling Denies abdominal pain, nausea, vomiting,diarrhea or constipation.   Denies dysuria, frequency, hesitancy or incontinence. Denies uncontrolled joint pain, swelling and limitation in mobility. Denies headaches, seizures, numbness, or tingling. Denies depression, anxiety or insomnia. Denies skin break down or rash.   PE  BP 120/80 (BP Location: Right Arm, Patient Position: Sitting, Cuff Size: Large)   Pulse 85   Ht 5\' 3"  (1.6 m)   Wt 202 lb (91.6 kg)   SpO2 94%   BMI 35.78 kg/m   Patient alert and oriented and in no cardiopulmonary distress.  HEENT: No facial asymmetry, EOMI,     Neck supple .Mild left maxillary tenderness, TM clear bilaterally  Chest: Clear to auscultation bilaterally.  CVS: S1, S2 no murmurs, no S3.Regular rate.  ABD: Soft non tender.   Ext: No edema  MS: Adequate ROM spine, shoulders, hips and knees.  Skin: Intact, no ulcerations or rash noted.  Psych: Good eye contact, normal affect. Memory intact not anxious or depressed appearing.  CNS: CN 2-12 intact, power,  normal throughout.no focal  deficits noted.   Assessment & Plan  Essential hypertension, benign Controlled, no change in medication DASH diet and commitment to daily physical activity for a minimum of 30 minutes discussed and encouraged, as a part of hypertension management. The importance of attaining a healthy weight is also discussed.     04/17/2023    8:21 AM 12/12/2022    8:06 AM 08/08/2022   11:55 AM 08/08/2022   11:16 AM 05/26/2022   11:14 AM 05/26/2022   10:50 AM 02/24/2022    8:35 AM  BP/Weight  Systolic BP 120 127 130 135 124 136 132  Diastolic BP 80 85 88 87 84 89 86  Wt. (Lbs) 202 203.04  207  208.12 211.4  BMI 35.78 kg/m2 35.97 kg/m2  36.67 kg/m2  36.87 kg/m2 37.45 kg/m2       Hyperlipidemia LDL goal <100 Hyperlipidemia:Low fat diet discussed and encouraged.   Lipid Panel  Lab Results  Component Value Date   CHOL 203 (H) 12/12/2022   HDL 52 12/12/2022   LDLCALC 132 (H) 12/12/2022   TRIG 106 12/12/2022   CHOLHDL 3.9 12/12/2022     Updated lab needed at/ before next visit.   Type 2 diabetes mellitus with vascular disease [facility]) Brittany Maxwell is reminded of the importance of commitment to daily physical activity for 30 minutes or more, as able and the need to limit carbohydrate intake to 30 to 60 grams per meal to help with blood sugar control.   The need to take medication as prescribed,  test blood sugar as directed, and to call between visits if there is a concern that blood sugar is uncontrolled is also discussed.   Brittany Maxwell is reminded of the importance of daily foot exam, annual eye examination, and good blood sugar, blood pressure and cholesterol control.     Latest Ref Rng & Units 12/12/2022    9:17 AM 05/24/2022    9:50 AM 11/11/2021   11:03 AM 08/02/2021   10:10 AM 03/09/2021   11:35 AM  Diabetic Labs  HbA1c 4.8 - 5.6 % 6.4  6.5  6.4  6.9  6.7   Micro/Creat Ratio 0 - 29 mg/g creat 11       Chol 100 - 199 mg/dL 213   086  578    HDL >46 mg/dL 52   54  58    Calc LDL 0 -  99 mg/dL 962   59  952    Triglycerides 0 - 149 mg/dL 841   324  401    Creatinine 0.57 - 1.00 mg/dL 0.27  2.53  6.64  4.03        04/17/2023    8:21 AM 12/12/2022    8:06 AM 08/08/2022   11:55 AM 08/08/2022   11:16 AM 05/26/2022   11:14 AM 05/26/2022   10:50 AM 02/24/2022    8:35 AM  BP/Weight  Systolic BP 120 127 130 135 124 136 132  Diastolic BP 80 85 88 87 84 89 86  Wt. (Lbs) 202 203.04  207  208.12 211.4  BMI 35.78 kg/m2 35.97 kg/m2  36.67 kg/m2  36.87 kg/m2 37.45 kg/m2      Latest Ref Rng & Units 08/08/2022   11:00 AM 09/29/2021   12:00 AM  Foot/eye exam completion dates  Eye Exam No Retinopathy  No Retinopathy      Foot Form Completion  Done      This result is from an external source.      Updated lab needed at/ before next visit.   Major depression in partial remission (HCC) Controlled, no change in medication   Obesity (BMI 30.0-34.9)  Patient re-educated about  the importance of commitment to a  minimum of 150 minutes of exercise per week as able.  The importance of healthy food choices with portion control discussed, as well as eating regularly and within a 12 hour window most days. The need to choose "clean , green" food 50 to 75% of the time is discussed, as well as to make water the primary drink and set a goal of 64 ounces water daily.       04/17/2023    8:21 AM 12/12/2022    8:06 AM 08/08/2022   11:16 AM  Weight /BMI  Weight 202 lb 203 lb 0.6 oz 207 lb  Height 5\' 3"  (1.6 m) 5\' 3"  (1.6 m) 5\' 3"  (1.6 m)  BMI 35.78 kg/m2 35.97 kg/m2 36.67 kg/m2    unchanged  Insomnia Improved with medication, continue same Sleep hygiene reviewed and written information offered also. Prescription sent for  medication needed.   Allergic rhinosinusitis Uncontrolled currently, astel;lin prescribed

## 2023-04-17 NOTE — Assessment & Plan Note (Signed)
Controlled, no change in medication  

## 2023-04-17 NOTE — Assessment & Plan Note (Signed)
  Patient re-educated about  the importance of commitment to a  minimum of 150 minutes of exercise per week as able.  The importance of healthy food choices with portion control discussed, as well as eating regularly and within a 12 hour window most days. The need to choose "clean , green" food 50 to 75% of the time is discussed, as well as to make water the primary drink and set a goal of 64 ounces water daily.       04/17/2023    8:21 AM 12/12/2022    8:06 AM 08/08/2022   11:16 AM  Weight /BMI  Weight 202 lb 203 lb 0.6 oz 207 lb  Height 5\' 3"  (1.6 m) 5\' 3"  (1.6 m) 5\' 3"  (1.6 m)  BMI 35.78 kg/m2 35.97 kg/m2 36.67 kg/m2    unchanged

## 2023-04-18 ENCOUNTER — Encounter: Payer: Self-pay | Admitting: Family Medicine

## 2023-04-18 ENCOUNTER — Other Ambulatory Visit: Payer: Self-pay | Admitting: Family Medicine

## 2023-04-18 LAB — LIPID PANEL
Chol/HDL Ratio: 2.7 ratio (ref 0.0–4.4)
Cholesterol, Total: 144 mg/dL (ref 100–199)
HDL: 54 mg/dL (ref >39)
LDL Chol Calc (NIH): 67 mg/dL (ref 0–99)
Triglycerides: 129 mg/dL (ref 0–149)
VLDL Cholesterol Cal: 23 mg/dL (ref 5–40)

## 2023-04-18 LAB — CMP14+EGFR
ALT: 20 IU/L (ref 0–32)
AST: 23 IU/L (ref 0–40)
Albumin: 4.4 g/dL (ref 3.8–4.9)
Alkaline Phosphatase: 114 IU/L (ref 44–121)
BUN/Creatinine Ratio: 15 (ref 9–23)
BUN: 18 mg/dL (ref 6–24)
Bilirubin Total: 0.6 mg/dL (ref 0.0–1.2)
CO2: 26 mmol/L (ref 20–29)
Calcium: 10 mg/dL (ref 8.7–10.2)
Chloride: 98 mmol/L (ref 96–106)
Creatinine, Ser: 1.18 mg/dL — ABNORMAL HIGH (ref 0.57–1.00)
Globulin, Total: 3.7 g/dL (ref 1.5–4.5)
Glucose: 105 mg/dL — ABNORMAL HIGH (ref 70–99)
Potassium: 3.3 mmol/L — ABNORMAL LOW (ref 3.5–5.2)
Sodium: 140 mmol/L (ref 134–144)
Total Protein: 8.1 g/dL (ref 6.0–8.5)
eGFR: 54 mL/min/{1.73_m2} — ABNORMAL LOW (ref >59)

## 2023-04-18 LAB — HEMOGLOBIN A1C
Est. average glucose Bld gHb Est-mCnc: 137 mg/dL
Hgb A1c MFr Bld: 6.4 % — ABNORMAL HIGH (ref 4.8–5.6)

## 2023-04-18 MED ORDER — TIRZEPATIDE 7.5 MG/0.5ML ~~LOC~~ SOAJ
7.5000 mg | SUBCUTANEOUS | 3 refills | Status: DC
Start: 2023-04-18 — End: 2023-08-13

## 2023-04-18 MED ORDER — POTASSIUM CHLORIDE CRYS ER 20 MEQ PO TBCR: 20.0000 meq | EXTENDED_RELEASE_TABLET | Freq: Every day | ORAL | 1 refills | Status: DC

## 2023-04-18 NOTE — Addendum Note (Signed)
Addended by: Kerri Perches on: 04/18/2023 07:56 AM   Modules accepted: Orders

## 2023-04-21 ENCOUNTER — Other Ambulatory Visit: Payer: Self-pay | Admitting: Family Medicine

## 2023-05-02 ENCOUNTER — Other Ambulatory Visit: Payer: Self-pay | Admitting: Family Medicine

## 2023-05-05 ENCOUNTER — Other Ambulatory Visit: Payer: Self-pay | Admitting: Family Medicine

## 2023-05-13 ENCOUNTER — Other Ambulatory Visit: Payer: Self-pay | Admitting: Family Medicine

## 2023-06-08 ENCOUNTER — Ambulatory Visit (HOSPITAL_COMMUNITY)
Admission: RE | Admit: 2023-06-08 | Discharge: 2023-06-08 | Disposition: A | Discharge status: Home / Self Care | Payer: BC Managed Care – PPO | Source: Ambulatory Visit | Attending: Family Medicine | Admitting: Family Medicine

## 2023-06-08 DIAGNOSIS — Z1231 Encounter for screening mammogram for malignant neoplasm of breast: Secondary | ICD-10-CM | POA: Insufficient documentation

## 2023-06-19 ENCOUNTER — Other Ambulatory Visit: Payer: Self-pay | Admitting: Family Medicine

## 2023-06-20 ENCOUNTER — Other Ambulatory Visit: Payer: Self-pay | Admitting: Family Medicine

## 2023-06-23 ENCOUNTER — Other Ambulatory Visit: Payer: Self-pay | Admitting: Family Medicine

## 2023-07-09 DIAGNOSIS — M47816 Spondylosis without myelopathy or radiculopathy, lumbar region: Secondary | ICD-10-CM | POA: Diagnosis not present

## 2023-07-09 DIAGNOSIS — Z79891 Long term (current) use of opiate analgesic: Secondary | ICD-10-CM | POA: Diagnosis not present

## 2023-07-27 ENCOUNTER — Other Ambulatory Visit: Payer: Self-pay | Admitting: Family Medicine

## 2023-08-05 ENCOUNTER — Other Ambulatory Visit: Payer: Self-pay | Admitting: Family Medicine

## 2023-08-12 ENCOUNTER — Other Ambulatory Visit: Payer: Self-pay | Admitting: Family Medicine

## 2023-08-15 ENCOUNTER — Encounter: Payer: Self-pay | Admitting: Family Medicine

## 2023-08-15 ENCOUNTER — Ambulatory Visit (INDEPENDENT_AMBULATORY_CARE_PROVIDER_SITE_OTHER): Payer: BC Managed Care – PPO | Admitting: Family Medicine

## 2023-08-15 VITALS — BP 109/75 | HR 80 | Ht 63.0 in | Wt 204.0 lb

## 2023-08-15 DIAGNOSIS — E119 Type 2 diabetes mellitus without complications: Secondary | ICD-10-CM

## 2023-08-15 DIAGNOSIS — Z0001 Encounter for general adult medical examination with abnormal findings: Secondary | ICD-10-CM

## 2023-08-15 DIAGNOSIS — E559 Vitamin D deficiency, unspecified: Secondary | ICD-10-CM

## 2023-08-15 DIAGNOSIS — E785 Hyperlipidemia, unspecified: Secondary | ICD-10-CM

## 2023-08-15 DIAGNOSIS — E1159 Type 2 diabetes mellitus with other circulatory complications: Secondary | ICD-10-CM | POA: Diagnosis not present

## 2023-08-15 DIAGNOSIS — Z23 Encounter for immunization: Secondary | ICD-10-CM

## 2023-08-15 DIAGNOSIS — I10 Essential (primary) hypertension: Secondary | ICD-10-CM | POA: Diagnosis not present

## 2023-08-15 NOTE — Progress Notes (Addendum)
EMAAN Maxwell     MRN: [id number]      DOB: 1966/08/12  Chief Complaint  Patient presents with   Annual Exam    CPE     HPI: Patient is in for annual physical exam. No other health concerns are expressed or addressed at the visit. Recent labs,  are reviewed. Immunization is reviewed , and  updated if needed.   PE: BP 109/75 (BP Location: Right Arm, Patient Position: Sitting, Cuff Size: Large)   Pulse 80   Ht 5\' 3"  (1.6 m)   Wt 204 lb 0.6 oz (92.6 kg)   SpO2 95%   BMI 36.14 kg/m   Pleasant  female, alert and oriented x 3, in no cardio-pulmonary distress. Afebrile. HEENT No facial trauma or asymetry. Sinuses non tender.  Extra occullar muscles intact.. External ears normal, . Neck: supple, no adenopathy,JVD or thyromegaly.No bruits.  Chest: Clear to ascultation bilaterally.No crackles or wheezes. Non tender to palpation  Cardiovascular system; Heart sounds normal,  S1 and  S2 ,no S3.  No murmur, or thrill. Apical beat not displaced Peripheral pulses normal.  Abdomen: Soft, non tender, no organomegaly or masses. No bruits. Bowel sounds normal. No guarding, tenderness or rebound.    Musculoskeletal exam: Decreased though adequate  ROM of spine, normal in hips , shoulders and knees. No deformity ,swelling or crepitus noted. No muscle wasting or atrophy.   Neurologic: Cranial nerves 2 to 12 intact. Power, tone ,sensation  normal throughout. No disturbance in gait. No tremor.  Skin: Intact, no ulceration, erythema , scaling or rash noted. Pigmentation normal throughout  Psych; Normal mood and affect. Judgement and concentration normal   Assessment & Plan:  Annual visit for general adult medical examination with abnormal findings Annual exam as documented. Counseling done  re healthy lifestyle involving commitment to 150 minutes exercise per week, heart healthy diet, and attaining healthy weight.The importance of adequate sleep also  discussed. Regular seat belt use and home safety, is also discussed. Changes in health habits are decided on by the patient with goals and time frames  set for achieving them. Immunization and cancer screening needs are specifically addressed at this visit.   Encounter for immunization After obtaining informed consent, the influenza  vaccine is  administered , with no adverse effect noted at the time of administration.   Diabetic eye exam [name]) Past due , referral entered  Type 2 diabetes mellitus with vascular disease (HCC) Brittany Maxwell is reminded of the importance of commitment to daily physical activity for 30 minutes or more, as able and the need to limit carbohydrate intake to 30 to 60 grams per meal to help with blood sugar control.   The need to take medication as prescribed, test blood sugar as directed, and to call between visits if there is a concern that blood sugar is uncontrolled is also discussed.   Brittany Maxwell is reminded of the importance of daily foot exam, annual eye examination, and good blood sugar, blood pressure and cholesterol control.     Latest Ref Rng & Units 08/15/2023    8:52 AM 04/17/2023    9:12 AM 12/12/2022    9:17 AM 05/24/2022    9:50 AM 11/11/2021   11:03 AM  Diabetic Labs  HbA1c 4.8 - 5.6 % 6.4  6.4  6.4  6.5  6.4   Micro/Creat Ratio 0 - 29 mg/g creat   11     Chol 100 - 199 mg/dL  315  400  136   HDL >39 mg/dL  54  52   54   Calc LDL 0 - 99 mg/dL  67  846   59   Triglycerides 0 - 149 mg/dL  962  952   841   Creatinine 0.57 - 1.00 mg/dL 3.24  4.01  0.27  2.53  1.23       08/15/2023    8:05 AM 04/17/2023    8:21 AM 12/12/2022    8:06 AM 08/08/2022   11:55 AM 08/08/2022   11:16 AM 05/26/2022   11:14 AM 05/26/2022   10:50 AM  BP/Weight  Systolic BP 109 120 127 130 135 124 136  Diastolic BP 75 80 85 88 87 84 89  Wt. (Lbs) 204.04 202 203.04  207  208.12  BMI 36.14 kg/m2 35.78 kg/m2 35.97 kg/m2  36.67 kg/m2  36.87 kg/m2      Latest Ref Rng  & Units 08/08/2022   11:00 AM 09/29/2021   12:00 AM  Foot/eye exam completion dates  Eye Exam No Retinopathy  No Retinopathy      Foot Form Completion  Done      This result is from an external source.

## 2023-08-15 NOTE — Patient Instructions (Addendum)
Follow-up end March, call if you need me before.  Flu vaccine in office today.  HbA1c Chem-7 and EGFR today.  You are referred to our my eye doctor in [place] for diabetic eye exam please call and schedule an appointment.  Nurse please give patient up telephone number to call.( Dr Genevie Cheshire)  Fasting lipid CMP and EGFR HbA1c , CBC, TSH, Vit D urine ACR March 13 or after to be drawn 3 to 5  days before your 58-month follow-up visit.  You are referred to diabetic nutrition educator.  Please keep up with your excellent health habits.  Best for 2025!    Wellness past due please schedule at checkout  Thanks for choosing [facility], we consider it a privelige to serve you.

## 2023-08-15 NOTE — Assessment & Plan Note (Signed)

## 2023-08-16 LAB — BMP8+EGFR
BUN/Creatinine Ratio: 16 (ref 9–23)
BUN: 21 mg/dL (ref 6–24)
CO2: 25 mmol/L (ref 20–29)
Calcium: 9.7 mg/dL (ref 8.7–10.2)
Chloride: 100 mmol/L (ref 96–106)
Creatinine, Ser: 1.3 mg/dL — ABNORMAL HIGH (ref 0.57–1.00)
Glucose: 90 mg/dL (ref 70–99)
Potassium: 3.4 mmol/L — ABNORMAL LOW (ref 3.5–5.2)
Sodium: 140 mmol/L (ref 134–144)
eGFR: 48 mL/min/{1.73_m2} — ABNORMAL LOW (ref >59)

## 2023-08-16 LAB — HEMOGLOBIN A1C
Est. average glucose Bld gHb Est-mCnc: 137 mg/dL
Hgb A1c MFr Bld: 6.4 % — ABNORMAL HIGH (ref 4.8–5.6)

## 2023-08-19 DIAGNOSIS — E119 Type 2 diabetes mellitus without complications: Secondary | ICD-10-CM | POA: Insufficient documentation

## 2023-08-19 DIAGNOSIS — Z23 Encounter for immunization: Secondary | ICD-10-CM | POA: Insufficient documentation

## 2023-08-19 NOTE — Assessment & Plan Note (Signed)
Past due , referral entered

## 2023-08-19 NOTE — Assessment & Plan Note (Signed)
After obtaining informed consent, the influenza vaccine is  administered , with no adverse effect noted at the time of administration.

## 2023-08-19 NOTE — Assessment & Plan Note (Signed)
Brittany Maxwell is reminded of the importance of commitment to daily physical activity for 30 minutes or more, as able and the need to limit carbohydrate intake to 30 to 60 grams per meal to help with blood sugar control.   The need to take medication as prescribed, test blood sugar as directed, and to call between visits if there is a concern that blood sugar is uncontrolled is also discussed.   Brittany Maxwell is reminded of the importance of daily foot exam, annual eye examination, and good blood sugar, blood pressure and cholesterol control.     Latest Ref Rng & Units 08/15/2023    8:52 AM 04/17/2023    9:12 AM 12/12/2022    9:17 AM 05/24/2022    9:50 AM 11/11/2021   11:03 AM  Diabetic Labs  HbA1c 4.8 - 5.6 % 6.4  6.4  6.4  6.5  6.4   Micro/Creat Ratio 0 - 29 mg/g creat   11     Chol 100 - 199 mg/dL  259  563   875   HDL >64 mg/dL  54  52   54   Calc LDL 0 - 99 mg/dL  67  332   59   Triglycerides 0 - 149 mg/dL  951  884   166   Creatinine 0.57 - 1.00 mg/dL 0.63  0.16  0.10  9.32  1.23       08/15/2023    8:05 AM 04/17/2023    8:21 AM 12/12/2022    8:06 AM 08/08/2022   11:55 AM 08/08/2022   11:16 AM 05/26/2022   11:14 AM 05/26/2022   10:50 AM  BP/Weight  Systolic BP 109 120 127 130 135 124 136  Diastolic BP 75 80 85 88 87 84 89  Wt. (Lbs) 204.04 202 203.04  207  208.12  BMI 36.14 kg/m2 35.78 kg/m2 35.97 kg/m2  36.67 kg/m2  36.87 kg/m2      Latest Ref Rng & Units 08/08/2022   11:00 AM 09/29/2021   12:00 AM  Foot/eye exam completion dates  Eye Exam No Retinopathy  No Retinopathy      Foot Form Completion  Done      This result is from an external source.

## 2023-08-20 ENCOUNTER — Other Ambulatory Visit: Payer: Self-pay

## 2023-08-20 MED ORDER — MOUNJARO 7.5 MG/0.5ML ~~LOC~~ SOAJ: 7.5000 mg | SUBCUTANEOUS | 0 refills | Status: DC

## 2023-08-22 ENCOUNTER — Other Ambulatory Visit: Payer: Self-pay

## 2023-08-22 MED ORDER — MOUNJARO 7.5 MG/0.5ML ~~LOC~~ SOAJ: 7.5000 mg | SUBCUTANEOUS | 1 refills | Status: DC

## 2023-08-25 ENCOUNTER — Other Ambulatory Visit: Payer: Self-pay | Admitting: Family Medicine

## 2023-09-10 ENCOUNTER — Ambulatory Visit (INDEPENDENT_AMBULATORY_CARE_PROVIDER_SITE_OTHER): Payer: BC Managed Care – PPO

## 2023-09-10 DIAGNOSIS — E1159 Type 2 diabetes mellitus with other circulatory complications: Secondary | ICD-10-CM | POA: Diagnosis not present

## 2023-09-10 LAB — HM DIABETES EYE EXAM

## 2023-09-10 NOTE — Progress Notes (Signed)
Brittany Maxwell arrived 09/10/2023 and has given verbal consent to obtain images and complete their overdue diabetic retinal screening.  The images have been sent to an ophthalmologist or optometrist for review and interpretation.  Results will be sent back to Kerri Perches, MD for review.  Patient has been informed they will be contacted when we receive the results via telephone or MyChart

## 2023-09-19 ENCOUNTER — Other Ambulatory Visit: Payer: Self-pay | Admitting: Family Medicine

## 2023-09-27 ENCOUNTER — Other Ambulatory Visit: Payer: Self-pay | Admitting: Family Medicine

## 2023-10-01 ENCOUNTER — Other Ambulatory Visit: Payer: Self-pay | Admitting: Family Medicine

## 2023-10-15 ENCOUNTER — Other Ambulatory Visit: Payer: Self-pay | Admitting: Family Medicine

## 2023-10-19 ENCOUNTER — Other Ambulatory Visit: Payer: Self-pay | Admitting: Family Medicine

## 2023-10-26 ENCOUNTER — Other Ambulatory Visit: Payer: Self-pay | Admitting: Family Medicine

## 2023-11-02 ENCOUNTER — Other Ambulatory Visit: Payer: Self-pay | Admitting: Family Medicine

## 2023-12-01 ENCOUNTER — Other Ambulatory Visit: Payer: Self-pay | Admitting: Family Medicine

## 2023-12-07 DIAGNOSIS — F1120 Opioid dependence, uncomplicated: Secondary | ICD-10-CM | POA: Diagnosis not present

## 2023-12-07 DIAGNOSIS — M47816 Spondylosis without myelopathy or radiculopathy, lumbar region: Secondary | ICD-10-CM | POA: Diagnosis not present

## 2023-12-07 DIAGNOSIS — Z1331 Encounter for screening for depression: Secondary | ICD-10-CM | POA: Diagnosis not present

## 2023-12-17 DIAGNOSIS — M47816 Spondylosis without myelopathy or radiculopathy, lumbar region: Secondary | ICD-10-CM | POA: Diagnosis not present

## 2023-12-27 ENCOUNTER — Ambulatory Visit: Payer: BC Managed Care – PPO | Admitting: Family Medicine

## 2024-01-01 ENCOUNTER — Ambulatory Visit: Payer: BC Managed Care – PPO | Admitting: Family Medicine

## 2024-01-01 ENCOUNTER — Encounter: Payer: Self-pay | Admitting: Family Medicine

## 2024-01-01 VITALS — BP 109/74 | HR 75 | Resp 16 | Ht 63.0 in | Wt 205.0 lb

## 2024-01-01 DIAGNOSIS — I10 Essential (primary) hypertension: Secondary | ICD-10-CM

## 2024-01-01 DIAGNOSIS — E66811 Obesity, class 1: Secondary | ICD-10-CM | POA: Diagnosis not present

## 2024-01-01 DIAGNOSIS — M5440 Lumbago with sciatica, unspecified side: Secondary | ICD-10-CM | POA: Diagnosis not present

## 2024-01-01 DIAGNOSIS — Z1231 Encounter for screening mammogram for malignant neoplasm of breast: Secondary | ICD-10-CM

## 2024-01-01 DIAGNOSIS — G8929 Other chronic pain: Secondary | ICD-10-CM

## 2024-01-01 DIAGNOSIS — E1159 Type 2 diabetes mellitus with other circulatory complications: Secondary | ICD-10-CM | POA: Diagnosis not present

## 2024-01-01 DIAGNOSIS — F324 Major depressive disorder, single episode, in partial remission: Secondary | ICD-10-CM

## 2024-01-01 DIAGNOSIS — E785 Hyperlipidemia, unspecified: Secondary | ICD-10-CM | POA: Diagnosis not present

## 2024-01-01 DIAGNOSIS — E559 Vitamin D deficiency, unspecified: Secondary | ICD-10-CM | POA: Diagnosis not present

## 2024-01-01 MED ORDER — AMLODIPINE BESYLATE 10 MG PO TABS: 10.0000 mg | ORAL_TABLET | Freq: Every day | ORAL | 2 refills | Status: AC

## 2024-01-01 NOTE — Assessment & Plan Note (Addendum)
 Increased and uncontrolled pain, management through pain clinic, needs to discuss probable need for cushioning med when travelling

## 2024-01-01 NOTE — Patient Instructions (Addendum)
 Annual physical exam in 6 months, call if you need me sooner  Labs already ordered today I will message results  Look to water aerobics  Continue to work on back pain and get best help you can  Thanks for choosing [facility], we consider it a privelige to serve you.  "[employer]" purchase

## 2024-01-01 NOTE — Assessment & Plan Note (Signed)
  Patient re-educated about  the importance of commitment to a  minimum of 150 minutes of exercise per week as able.  The importance of healthy food choices with portion control discussed, as well as eating regularly and within a 12 hour window most days. The need to choose "clean , green" food 50 to 75% of the time is discussed, as well as to make water the primary drink and set a goal of 64 ounces water daily.       01/01/2024    8:11 AM 08/15/2023    8:05 AM 04/17/2023    8:21 AM  Weight /BMI  Weight 205 lb 204 lb 0.6 oz 202 lb  Height 5\' 3"  (1.6 m) 5\' 3"  (1.6 m) 5\' 3"  (1.6 m)  BMI 36.31 kg/m2 36.14 kg/m2 35.78 kg/m2    Unchanged

## 2024-01-01 NOTE — Assessment & Plan Note (Signed)
 Controlled, no change in medication

## 2024-01-01 NOTE — Assessment & Plan Note (Signed)
 Controlled, no change in medication DASH diet and commitment to daily physical activity for a minimum of 30 minutes discussed and encouraged, as a part of hypertension management. The importance of attaining a healthy weight is also discussed.     01/01/2024    8:11 AM 08/15/2023    8:05 AM 04/17/2023    8:21 AM 12/12/2022    8:06 AM 08/08/2022   11:55 AM 08/08/2022   11:16 AM 05/26/2022   11:14 AM  BP/Weight  Systolic BP 109 109 120 127 130 135 124  Diastolic BP 74 75 80 85 88 87 84  Wt. (Lbs) 205 204.04 202 203.04  207   BMI 36.31 kg/m2 36.14 kg/m2 35.78 kg/m2 35.97 kg/m2  36.67 kg/m2

## 2024-01-01 NOTE — Progress Notes (Signed)
 Brittany Maxwell     MRN: [id number]      DOB: [birth year]  Chief Complaint  Patient presents with   Hypertension    Follow up    HPI Brittany Maxwell is here for follow up and re-evaluation of chronic medical conditions, medication management and review of any available recent lab and radiology data.  Preventive health is updated, specifically  Cancer screening and Immunization.   Questions or concerns regarding consultations or procedures which the PT has had in the interim are  addressed. The PT denies any adverse reactions to current medications since the last visit.  Uncontrolled and increased back pain in past 4 monhts, standing or sitting aggravate,lives at 6 , uanble to take the car ride to the beach with her grandson, treated through pain clinic ROS Denies recent fever or chills. Denies sinus pressure, nasal congestion, ear pain or sore throat. Denies chest congestion, productive cough or wheezing. Denies chest pains, palpitations and leg swelling Denies abdominal pain, nausea, vomiting,diarrhea or constipation.   Denies dysuria, frequency, hesitancy or incontinence. Denies depression, anxiety or insomnia. Denies skin break down or rash.   PE  BP 109/74   Pulse 75   Resp 16   Ht 5\' 3"  (1.6 m)   Wt 205 lb (93 kg)   SpO2 97%   BMI 36.31 kg/m   Patient alert and oriented and in no cardiopulmonary distress.  HEENT: No facial asymmetry, EOMI,     Neck supple .  Chest: Clear to auscultation bilaterally.  CVS: S1, S2 no murmurs, no S3.Regular rate.  ABD: Soft non tender.   Ext: No edema  MS: decreased ROM spine, adequate in shoulders, hips and knees.  Skin: Intact, no ulcerations or rash noted.  Psych: Good eye contact, normal affect. Memory intact not anxious or depressed appearing.  CNS: CN 2-12 intact, power,  normal throughout.no focal deficits noted.   Assessment & Plan  Essential hypertension, benign Controlled, no change in medication DASH diet and  commitment to daily physical activity for a minimum of 30 minutes discussed and encouraged, as a part of hypertension management. The importance of attaining a healthy weight is also discussed.     01/01/2024    8:11 AM 08/15/2023    8:05 AM 04/17/2023    8:21 AM 12/12/2022    8:06 AM 08/08/2022   11:55 AM 08/08/2022   11:16 AM 05/26/2022   11:14 AM  BP/Weight  Systolic BP 109 109 120 127 130 135 124  Diastolic BP 74 75 80 85 88 87 84  Wt. (Lbs) 205 204.04 202 203.04  207   BMI 36.31 kg/m2 36.14 kg/m2 35.78 kg/m2 35.97 kg/m2  36.67 kg/m2        LOW BACK PAIN Increased and uncontrolled pain, management through pain clinic, needs to discuss probable need for cushioning med when travelling  Type 2 diabetes mellitus with vascular disease (HCC) Diabetes associated with hypertension, hyperlipidemia, obesity, and arthritis  Brittany Maxwell is reminded of the importance of commitment to daily physical activity for 30 minutes or more, as able and the need to limit carbohydrate intake to 30 to 60 grams per meal to help with blood sugar control.   The need to take medication as prescribed, test blood sugar as directed, and to call between visits if there is a concern that blood sugar is uncontrolled is also discussed.   Brittany Maxwell is reminded of the importance of daily foot exam, annual eye examination, and good blood sugar, blood  pressure and cholesterol control.     Latest Ref Rng & Units 08/15/2023    8:52 AM 04/17/2023    9:12 AM 12/12/2022    9:17 AM 05/24/2022    9:50 AM 11/11/2021   11:03 AM  Diabetic Labs  HbA1c 4.8 - 5.6 % 6.4  6.4  6.4  6.5  6.4   Micro/Creat Ratio 0 - 29 mg/g creat   11     Chol 100 - 199 mg/dL  132  440   102   HDL >72 mg/dL  54  52   54   Calc LDL 0 - 99 mg/dL  67  536   59   Triglycerides 0 - 149 mg/dL  644  034   742   Creatinine 0.57 - 1.00 mg/dL 5.95  6.38  7.56  4.33  1.23       01/01/2024    8:11 AM 08/15/2023    8:05 AM 04/17/2023    8:21 AM  12/12/2022    8:06 AM 08/08/2022   11:55 AM 08/08/2022   11:16 AM 05/26/2022   11:14 AM  BP/Weight  Systolic BP 109 109 120 127 130 135 124  Diastolic BP 74 75 80 85 88 87 84  Wt. (Lbs) 205 204.04 202 203.04  207   BMI 36.31 kg/m2 36.14 kg/m2 35.78 kg/m2 35.97 kg/m2  36.67 kg/m2       Latest Ref Rng & Units 09/10/2023   12:00 AM 08/08/2022   11:00 AM  Foot/eye exam completion dates  Eye Exam No Retinopathy No Retinopathy       Foot Form Completion   Done     This result is from an external source.     Updated lab needed at/ before next visit.    Obesity (BMI 30.0-34.9)  Patient re-educated about  the importance of commitment to a  minimum of 150 minutes of exercise per week as able.  The importance of healthy food choices with portion control discussed, as well as eating regularly and within a 12 hour window most days. The need to choose "clean , green" food 50 to 75% of the time is discussed, as well as to make water the primary drink and set a goal of 64 ounces water daily.       01/01/2024    8:11 AM 08/15/2023    8:05 AM 04/17/2023    8:21 AM  Weight /BMI  Weight 205 lb 204 lb 0.6 oz 202 lb  Height 5\' 3"  (1.6 m) 5\' 3"  (1.6 m) 5\' 3"  (1.6 m)  BMI 36.31 kg/m2 36.14 kg/m2 35.78 kg/m2    Unchanged  Major depression in partial remission (HCC) Controlled, no change in medication

## 2024-01-01 NOTE — Assessment & Plan Note (Addendum)
 Diabetes associated with hypertension, hyperlipidemia, obesity, and arthritis  Brittany Maxwell is reminded of the importance of commitment to daily physical activity for 30 minutes or more, as able and the need to limit carbohydrate intake to 30 to 60 grams per meal to help with blood sugar control.   The need to take medication as prescribed, test blood sugar as directed, and to call between visits if there is a concern that blood sugar is uncontrolled is also discussed.   Brittany Maxwell is reminded of the importance of daily foot exam, annual eye examination, and good blood sugar, blood pressure and cholesterol control.     Latest Ref Rng & Units 08/15/2023    8:52 AM 04/17/2023    9:12 AM 12/12/2022    9:17 AM 05/24/2022    9:50 AM 11/11/2021   11:03 AM  Diabetic Labs  HbA1c 4.8 - 5.6 % 6.4  6.4  6.4  6.5  6.4   Micro/Creat Ratio 0 - 29 mg/g creat   11     Chol 100 - 199 mg/dL  161  096   045   HDL >40 mg/dL  54  52   54   Calc LDL 0 - 99 mg/dL  67  981   59   Triglycerides 0 - 149 mg/dL  191  478   295   Creatinine 0.57 - 1.00 mg/dL 6.21  3.08  6.57  8.46  1.23       01/01/2024    8:11 AM 08/15/2023    8:05 AM 04/17/2023    8:21 AM 12/12/2022    8:06 AM 08/08/2022   11:55 AM 08/08/2022   11:16 AM 05/26/2022   11:14 AM  BP/Weight  Systolic BP 109 109 120 127 130 135 124  Diastolic BP 74 75 80 85 88 87 84  Wt. (Lbs) 205 204.04 202 203.04  207   BMI 36.31 kg/m2 36.14 kg/m2 35.78 kg/m2 35.97 kg/m2  36.67 kg/m2       Latest Ref Rng & Units 09/10/2023   12:00 AM 08/08/2022   11:00 AM  Foot/eye exam completion dates  Eye Exam No Retinopathy No Retinopathy       Foot Form Completion   Done     This result is from an external source.     Updated lab needed at/ before next visit.

## 2024-01-02 ENCOUNTER — Encounter: Payer: Self-pay | Admitting: Family Medicine

## 2024-01-03 LAB — CMP14+EGFR
ALT: 20 IU/L (ref 0–32)
AST: 21 IU/L (ref 0–40)
Albumin: 4.4 g/dL (ref 3.8–4.9)
Alkaline Phosphatase: 111 IU/L (ref 44–121)
BUN/Creatinine Ratio: 11 (ref 9–23)
BUN: 13 mg/dL (ref 6–24)
Bilirubin Total: 0.4 mg/dL (ref 0.0–1.2)
CO2: 26 mmol/L (ref 20–29)
Calcium: 9.8 mg/dL (ref 8.7–10.2)
Chloride: 101 mmol/L (ref 96–106)
Creatinine, Ser: 1.21 mg/dL — ABNORMAL HIGH (ref 0.57–1.00)
Globulin, Total: 3.5 g/dL (ref 1.5–4.5)
Glucose: 95 mg/dL (ref 70–99)
Potassium: 3.7 mmol/L (ref 3.5–5.2)
Sodium: 142 mmol/L (ref 134–144)
Total Protein: 7.9 g/dL (ref 6.0–8.5)
eGFR: 52 mL/min/{1.73_m2} — ABNORMAL LOW (ref >59)

## 2024-01-03 LAB — TSH: TSH: 2.63 u[IU]/mL (ref 0.450–4.500)

## 2024-01-03 LAB — HEMOGLOBIN A1C
Est. average glucose Bld gHb Est-mCnc: 143 mg/dL
Hgb A1c MFr Bld: 6.6 % — ABNORMAL HIGH (ref 4.8–5.6)

## 2024-01-03 LAB — CBC
Hematocrit: 39.4 % (ref 34.0–46.6)
Hemoglobin: 13 g/dL (ref 11.1–15.9)
MCH: 28.1 pg (ref 26.6–33.0)
MCHC: 33 g/dL (ref 31.5–35.7)
MCV: 85 fL (ref 79–97)
Platelets: 406 10*3/uL (ref 150–450)
RBC: 4.62 x10E6/uL (ref 3.77–5.28)
RDW: 14.3 % (ref 11.7–15.4)
WBC: 8.6 10*3/uL (ref 3.4–10.8)

## 2024-01-03 LAB — LIPID PANEL
Chol/HDL Ratio: 2.4 ratio (ref 0.0–4.4)
Cholesterol, Total: 136 mg/dL (ref 100–199)
HDL: 57 mg/dL (ref >39)
LDL Chol Calc (NIH): 60 mg/dL (ref 0–99)
Triglycerides: 101 mg/dL (ref 0–149)
VLDL Cholesterol Cal: 19 mg/dL (ref 5–40)

## 2024-01-03 LAB — MICROALBUMIN / CREATININE URINE RATIO
Creatinine, Urine: 135.7 mg/dL
Microalb/Creat Ratio: 12 mg/g{creat} (ref 0–29)
Microalbumin, Urine: 16.9 ug/mL

## 2024-01-03 LAB — VITAMIN D 25 HYDROXY (VIT D DEFICIENCY, FRACTURES): Vit D, 25-Hydroxy: 40.1 ng/mL (ref 30.0–100.0)

## 2024-01-10 ENCOUNTER — Telehealth: Payer: Self-pay | Admitting: Family Medicine

## 2024-01-10 ENCOUNTER — Other Ambulatory Visit: Payer: Self-pay | Admitting: Family Medicine

## 2024-01-10 NOTE — Telephone Encounter (Signed)
 Copied from CRM [id number]. Topic: Clinical - Prescription Issue >> Jan 10, 2024  1:52 PM Nyra Capes wrote: Reason for CRM: Brittany [facility] with [facility] and [employer] [phone] stating they are a mail order pharmacy and in order to fill the prescription it needs to be for 90 days not 30 day.   tirzepatide Brittany Maxwell) 7.5 MG/0.5ML Pen   Fax to [phone]   [facility] # [id number]  Patient will be out of the medication on 01/28/24 and it takes 10 days for patient to receive it through the mail.

## 2024-01-11 ENCOUNTER — Telehealth: Payer: Self-pay

## 2024-01-11 NOTE — Telephone Encounter (Signed)
 Copied from CRM [id number]. Topic: Clinical - Prescription Issue >> Jan 11, 2024  1:45 PM Pierre Bali B wrote: Reason for CRM: Morrie Sheldon called in on behalf of [facility] she stated that the medication  tirzepatide [facility]) 7.5 MG/0.5ML Pen prescription needs to be corrected to 90 day supply . The patients pharmacy is mailing service that only does 90 day supplies . If you need to give them a call for it to be corrected their number is [phone].

## 2024-01-14 ENCOUNTER — Other Ambulatory Visit: Payer: Self-pay

## 2024-01-14 ENCOUNTER — Ambulatory Visit: Payer: Self-pay

## 2024-01-14 MED ORDER — MOUNJARO 7.5 MG/0.5ML ~~LOC~~ SOAJ: 7.5000 mg | SUBCUTANEOUS | 1 refills | Status: DC

## 2024-01-14 NOTE — Telephone Encounter (Signed)
Sent for 90 day supply

## 2024-01-14 NOTE — Telephone Encounter (Signed)
 Noted, patient denied appt

## 2024-01-14 NOTE — Telephone Encounter (Signed)
 Chief Complaint: facial swelling  Symptoms: R-sided facial swelling, sinus pain Frequency: since last Thursday Pertinent Negatives: Patient denies difficulty swallowing or breathing Disposition: [] ED /[] Urgent Care (no appt availability in office) / [x] Appointment(In office/virtual)/ []  [facility]/ [] Home Care/ [x] Refused Recommended Disposition /[] [facility]/ []  Follow-up with PCP Additional Notes: Pt reports sinus pain, pressure, and facial swelling that began last Thursday. Swelling is right-sided. Pt reports she had L-sided facial swelling in the past and was treated with an antibiotic and steroids. Pt denies difficulty breathing or swallowing. Pt states she had difficulty eating over the weekend because chewing was painful. Pt states she is able to eat normally now. Pt endorses 5/10 pain to the touch. RN advised pt she should be seen within 24 hours. RN stated she is very busy lately taking her husband to his doctor's appointments. Patient states he has appts today and tomorrow and she would not be able to make it to the doctor until Wednesday.  RN advised pt she should go to a walk-in UC sometime in the next 24 hours that works with her busy schedule. Pt agreeable to do that. RN advised pt if she develops difficulty swallowing, breathing, or chewing again, she should go to the ED instead. Pt verbalized understanding.  Patient called requesting medication be sent to the pharmacy for her symptoms, RN advised pt providers prefer to assess a patient before prescribing antibiotics. Please follow-up.      Copied from CRM [id number]. Topic: Clinical - Medication Question >> Jan 14, 2024  9:01 AM Ardeth Perfect H wrote: Reason for CRM: Patient called requesting medication for a sinus infection. She reports swelling on the right side of her face associated with the infection. Callback number [phone] Reason for Disposition  Face swelling is painful to touch  Answer Assessment -  Initial Assessment Questions 1. ONSET: "When did the swelling start?" (e.g., minutes, hours, days)     Pain last Thursday  2. LOCATION: "What part of the face is swollen?"     R-sided facial swelling, sore to the touch 3. SEVERITY: "How swollen is it?"     Still noticeable  4. ITCHING: "Is there any itching?" If Yes, ask: "How much?"   (Scale 1-10; mild, moderate or severe)     No  5. PAIN: "Is the swelling painful to touch?" If Yes, ask: "How painful is it?"   (Scale 1-10; mild, moderate or severe)   - NONE (0): no pain   - MILD (1-3): doesn't interfere with normal activities    - MODERATE (4-7): interferes with normal activities or awakens from sleep    - SEVERE (8-10): excruciating pain, unable to do any normal activities      Today it is a 5/10 6. FEVER: "Do you have a fever?" If Yes, ask: "What is it, how was it measured, and when did it start?"      Had "a little fever" Saturday, none since  7. CAUSE: "What do you think is causing the face swelling?"     Poss sinus infection 8. RECURRENT SYMPTOM: "Have you had face swelling before?" If Yes, ask: "When was the last time?" "What happened that time?"     Pt states she had L-sided facial swelling before, states it was "real bad" at that time. Pt states she was treated for a sinus and viral infection at that time 9. OTHER SYMPTOMS: "Do you have any other symptoms?" (e.g., toothache, leg swelling)     Pt states she took  Tylenol Sinus, pt states she could not eat yesterday d/t chewing making her face hurt, pt states one of her teeth hurts with pressure applied, pt states she does not feel as much pressure today but it is still swollen.  Pt denies difficulty breathing and swallowing. Pt states she is able to chew and eat today but yesterday and Saturday chewing was painful. Denies swelling. Denies congestion. Pt states when she blows her nose it is clear.  Protocols used: Face Swelling-A-AH

## 2024-01-21 ENCOUNTER — Other Ambulatory Visit: Payer: Self-pay | Admitting: Family Medicine

## 2024-01-29 ENCOUNTER — Other Ambulatory Visit: Payer: Self-pay | Admitting: Family Medicine

## 2024-02-25 ENCOUNTER — Other Ambulatory Visit: Payer: Self-pay | Admitting: Family Medicine

## 2024-03-11 ENCOUNTER — Encounter (HOSPITAL_COMMUNITY): Payer: Self-pay | Admitting: *Deleted

## 2024-03-11 ENCOUNTER — Emergency Department (HOSPITAL_COMMUNITY)

## 2024-03-11 ENCOUNTER — Other Ambulatory Visit: Payer: Self-pay

## 2024-03-11 ENCOUNTER — Emergency Department (HOSPITAL_COMMUNITY)
Admission: EM | Admit: 2024-03-11 | Discharge: 2024-03-11 | Disposition: A | Discharge status: Home / Self Care | Attending: Emergency Medicine | Admitting: Emergency Medicine

## 2024-03-11 DIAGNOSIS — E876 Hypokalemia: Secondary | ICD-10-CM | POA: Diagnosis not present

## 2024-03-11 DIAGNOSIS — R29898 Other symptoms and signs involving the musculoskeletal system: Secondary | ICD-10-CM | POA: Diagnosis not present

## 2024-03-11 DIAGNOSIS — E119 Type 2 diabetes mellitus without complications: Secondary | ICD-10-CM | POA: Insufficient documentation

## 2024-03-11 DIAGNOSIS — Z79899 Other long term (current) drug therapy: Secondary | ICD-10-CM | POA: Insufficient documentation

## 2024-03-11 DIAGNOSIS — M50021 Cervical disc disorder at C4-C5 level with myelopathy: Secondary | ICD-10-CM | POA: Diagnosis not present

## 2024-03-11 DIAGNOSIS — I10 Essential (primary) hypertension: Secondary | ICD-10-CM | POA: Insufficient documentation

## 2024-03-11 DIAGNOSIS — M4802 Spinal stenosis, cervical region: Secondary | ICD-10-CM | POA: Diagnosis not present

## 2024-03-11 DIAGNOSIS — M5003 Cervical disc disorder with myelopathy, cervicothoracic region: Secondary | ICD-10-CM | POA: Diagnosis not present

## 2024-03-11 LAB — CBC WITH DIFFERENTIAL/PLATELET
Abs Immature Granulocytes: 0.03 10*3/uL (ref 0.00–0.07)
Basophils Absolute: 0 10*3/uL (ref 0.0–0.1)
Basophils Relative: 1 %
Eosinophils Absolute: 0.2 10*3/uL (ref 0.0–0.5)
Eosinophils Relative: 2 %
HCT: 35.3 % — ABNORMAL LOW (ref 36.0–46.0)
Hemoglobin: 12 g/dL (ref 12.0–15.0)
Immature Granulocytes: 0 %
Lymphocytes Relative: 35 %
Lymphs Abs: 3 10*3/uL (ref 0.7–4.0)
MCH: 28.3 pg (ref 26.0–34.0)
MCHC: 34 g/dL (ref 30.0–36.0)
MCV: 83.3 fL (ref 80.0–100.0)
Monocytes Absolute: 0.6 10*3/uL (ref 0.1–1.0)
Monocytes Relative: 7 %
Neutro Abs: 4.7 10*3/uL (ref 1.7–7.7)
Neutrophils Relative %: 55 %
Platelets: 364 10*3/uL (ref 150–400)
RBC: 4.24 MIL/uL (ref 3.87–5.11)
RDW: 14 % (ref 11.5–15.5)
WBC: 8.6 10*3/uL (ref 4.0–10.5)
nRBC: 0 % (ref 0.0–0.2)

## 2024-03-11 LAB — COMPREHENSIVE METABOLIC PANEL WITH GFR
ALT: 18 U/L (ref 0–44)
AST: 20 U/L (ref 15–41)
Albumin: 3.6 g/dL (ref 3.5–5.0)
Alkaline Phosphatase: 75 U/L (ref 38–126)
Anion gap: 10 (ref 5–15)
BUN: 14 mg/dL (ref 6–20)
CO2: 27 mmol/L (ref 22–32)
Calcium: 9 mg/dL (ref 8.9–10.3)
Chloride: 102 mmol/L (ref 98–111)
Creatinine, Ser: 1.04 mg/dL — ABNORMAL HIGH (ref 0.44–1.00)
GFR, Estimated: >60 mL/min (ref >60)
Glucose, Bld: 105 mg/dL — ABNORMAL HIGH (ref 70–99)
Potassium: 2.7 mmol/L — CL (ref 3.5–5.1)
Sodium: 139 mmol/L (ref 135–145)
Total Bilirubin: 0.3 mg/dL (ref 0.0–1.2)
Total Protein: 7.7 g/dL (ref 6.5–8.1)

## 2024-03-11 LAB — MAGNESIUM: Magnesium: 2 mg/dL (ref 1.7–2.4)

## 2024-03-11 LAB — CK: Total CK: 142 U/L (ref 38–234)

## 2024-03-11 MED ORDER — POTASSIUM CHLORIDE CRYS ER 20 MEQ PO TBCR
40.0000 meq | EXTENDED_RELEASE_TABLET | Freq: Once | ORAL | Status: AC
  Administered 2024-03-11: 40 meq via ORAL
  Filled 2024-03-11: qty 2

## 2024-03-11 MED ORDER — POTASSIUM CHLORIDE 10 MEQ/100ML IV SOLN
10.0000 meq | Freq: Once | INTRAVENOUS | Status: AC
  Administered 2024-03-11: 10 meq via INTRAVENOUS
  Filled 2024-03-11: qty 100

## 2024-03-11 MED ORDER — DEXAMETHASONE SODIUM PHOSPHATE 10 MG/ML IJ SOLN
10.0000 mg | Freq: Once | INTRAMUSCULAR | Status: AC
  Administered 2024-03-11: 10 mg via INTRAVENOUS
  Filled 2024-03-11: qty 1

## 2024-03-11 MED ORDER — METHYLPREDNISOLONE 4 MG PO TBPK: ORAL_TABLET | Freq: Every day | ORAL | 0 refills | Status: DC

## 2024-03-11 MED ORDER — GADOBUTROL 1 MMOL/ML IV SOLN
10.0000 mL | Freq: Once | INTRAVENOUS | Status: AC | PRN
  Administered 2024-03-11: 10 mL via INTRAVENOUS

## 2024-03-11 NOTE — ED Triage Notes (Signed)
 Pt c/o left arm pain x 2 weeks  Pt denies any obvious injury

## 2024-03-11 NOTE — ED Provider Notes (Signed)
 [place] EMERGENCY DEPARTMENT AT [facility] Provider Note   CSN: [id number] Arrival date & time: 03/11/24  7829     History  Chief Complaint  Patient presents with   Arm Pain    Brittany Maxwell is a 58 y.o. female.  Pt is a 58 yo female with pmhx significant for arthritis, htn, hld, dm, chronic low back pain and carpal tunnel syndrome.  Pt said she noticed that her right arm (not left as in triage) has been weak for about 2 weeks.  She noticed it when she was making slaw for her grandmother's [employer].  She is right handed and was doing some twisting with her arm to make the slaw.  She said her forearm hurt and she thought she just irritated it.  She has been unable to use her right hand to hold anything or to do any fine motor skills.  She has to use her left hand to change gears in the car.  She has to help her right arm with her left to squeeze things.  She thought it would get better, but it has not.  No other neuro sx.       Home Medications Prior to Admission medications   Medication Sig Start Date End Date Taking? Authorizing Provider  gabapentin (NEURONTIN) 300 MG capsule Take 300 mg by mouth 2 (two) times daily. 01/04/24  Yes [provider]  HYDROcodone -acetaminophen  (NORCO/VICODIN) 5-325 MG tablet Take 1 tablet by mouth daily as needed. 12/07/23  Yes [provider]  methylPREDNISolone  (MEDROL  DOSEPAK) 4 MG TBPK tablet Take by mouth daily. Day 1:  2 pills at breakfast, 1 pill at lunch, 1 pill after supper, 2 pills at bedtime;Day 2:  1 pill at breakfast, 1 pill at lunch, 1 pill after supper, 2 pills at bedtime;Day 3:  1 pill at breakfast, 1 pill at lunch, 1 pill after supper, 1 pill at bedtime;Day 4:  1 pill at breakfast, 1 pill at lunch, 1 pill at bedtime;Day 5:  1 pill at breakfast, 1 pill at bedtime;Day 6:  1 pill at breakfast 03/11/24  Yes Sueellen Emery, MD  ACCU-CHEK GUIDE test strip USE 1 STRIP TO CHECK GLUCOSE ONCE DAILY 12/19/22    Towanda Fret, MD  Accu-Chek Softclix Lancets lancets USE 1 LANCET TO CHECK GLUCOSE ONCE DAILY 03/20/23   Towanda Fret, MD  acetaminophen  (TYLENOL ) 500 MG tablet Take 500 mg by mouth every 8 (eight) hours as needed for mild pain or moderate pain.    [provider]  amLODipine  (NORVASC ) 10 MG tablet Take 1 tablet (10 mg total) by mouth daily. 01/01/24   Towanda Fret, MD  azelastine  (ASTELIN ) 0.1 % nasal spray Place 2 sprays into both nostrils 2 (two) times daily. Use in each nostril as directed 04/17/23   Towanda Fret, MD  blood glucose meter kit and supplies Dispense based on patient and insurance preference. Once daily testing DX E11.9 11/11/21   Towanda Fret, MD  cholecalciferol (VITAMIN D ) 1000 units tablet Take 1,000 Units by mouth daily.    [provider]  cloNIDine  (CATAPRES ) 0.1 MG tablet TAKE 1 TABLET BY MOUTH AT BEDTIME FOR  UNCONTROLLED  BLOOD  PRESSURE. 12/03/23   Towanda Fret, MD  DULoxetine (CYMBALTA) 30 MG capsule Take 30 mg by mouth daily.    [provider]  DULoxetine (CYMBALTA) 60 MG capsule Take 60 mg by mouth daily.    [provider]  FLUoxetine  (PROZAC )  20 MG capsule Take 1 capsule by mouth once daily 01/10/24   Simpson, Margaret E, MD  fluticasone  (FLONASE ) 50 MCG/ACT nasal spray Place 2 sprays into both nostrils daily. 05/26/22   Towanda Fret, MD  gabapentin (NEURONTIN) 300 MG capsule Take 1 capsule (300 mg total) by mouth 2 (two) times daily. 03/09/21   Towanda Fret, MD  hydrOXYzine  (ATARAX ) 50 MG tablet TAKE 1 TABLET BY MOUTH AT BEDTIME FOR SLEEP 12/03/23   Towanda Fret, MD  hydrOXYzine  (ATARAX ) 50 MG tablet TAKE 1 TABLET BY MOUTH AT BEDTIME FOR SLEEP 02/26/24   Towanda Fret, MD  loratadine  (CLARITIN ) 10 MG tablet Take 1 tablet (10 mg total) by mouth daily. 03/09/21   Towanda Fret, MD  Multiple Vitamin (MULTIVITAMIN) capsule Take 1 capsule by mouth daily.    [provider]  potassium chloride  SA (KLOR-CON  M20) 20 MEQ tablet Take 1 tablet by mouth once daily 01/21/24   Towanda Fret, MD  rosuvastatin  (CRESTOR ) 20 MG tablet Take 1 tablet by mouth once daily 12/03/23   Towanda Fret, MD  tirzepatide  (MOUNJARO ) 7.5 MG/0.5ML Pen Inject 7.5 mg into the skin once a week. 01/14/24   Towanda Fret, MD  triamterene -hydrochlorothiazide (MAXZIDE) 75-50 MG tablet Take 1 tablet by mouth once daily 12/03/23   Towanda Fret, MD      Allergies    Other, Ace inhibitors, and Meloxicam     Review of Systems   Review of Systems  Neurological:        Right arm weakness  All other systems reviewed and are negative.   Physical Exam Updated Vital Signs BP 130/80   Pulse 84   Temp 98.1 F (36.7 C)   Resp 20   Ht 5\' 3"  (1.6 m)   Wt 85.3 kg   SpO2 92%   BMI 33.30 kg/m  Physical Exam Vitals and nursing note reviewed.  Constitutional:      Appearance: Normal appearance.  HENT:     Head: Normocephalic and atraumatic.     Right Ear: External ear normal.     Left Ear: External ear normal.     Nose: Nose normal.     Mouth/Throat:     Mouth: Mucous membranes are moist.     Pharynx: Oropharynx is clear.  Eyes:     Extraocular Movements: Extraocular movements intact.     Conjunctiva/sclera: Conjunctivae normal.     Pupils: Pupils are equal, round, and reactive to light.  Cardiovascular:     Rate and Rhythm: Normal rate and regular rhythm.     Pulses: Normal pulses.     Heart sounds: Normal heart sounds.  Pulmonary:     Effort: Pulmonary effort is normal.     Breath sounds: Normal breath sounds.  Abdominal:     General: Abdomen is flat. Bowel sounds are normal.     Palpations: Abdomen is soft.  Musculoskeletal:        General: Normal range of motion.     Cervical back: Normal range of motion and neck supple.     Comments: Mild tenderness to right brachioradialis muscle  Skin:    General: Skin is warm.     Capillary Refill:  Capillary refill takes less than 2 seconds.  Neurological:     Mental Status: She is alert and oriented to person, place, and time.     Comments: Right distal arm weakness  Psychiatric:        Mood and Affect:  Mood normal.        Behavior: Behavior normal.     ED Results / Procedures / Treatments   Labs (all labs ordered are listed, but only abnormal results are displayed) Labs Reviewed  COMPREHENSIVE METABOLIC PANEL WITH GFR - Abnormal; Notable for the following components:      Result Value   Potassium 2.7 (*)    Glucose, Bld 105 (*)    Creatinine, Ser 1.04 (*)    All other components within normal limits  CBC WITH DIFFERENTIAL/PLATELET - Abnormal; Notable for the following components:   HCT 35.3 (*)    All other components within normal limits  CK  MAGNESIUM    EKG EKG Interpretation Date/Time:  Tuesday March 11 2024 07:36:49 EDT Ventricular Rate:  68 PR Interval:  144 QRS Duration:  90 QT Interval:  414 QTC Calculation: 441 R Axis:   19  Text Interpretation: Sinus rhythm Borderline T abnormalities, diffuse leads No old tracing to compare Confirmed by Sueellen Emery [phone]) on 03/11/2024 7:51:28 AM  Radiology MR Cervical Spine W and Wo Contrast Result Date: 03/11/2024 CLINICAL DATA:  ACUTE MYELOPATHY. EXAM: MRI CERVICAL SPINE WITHOUT AND WITH CONTRAST TECHNIQUE: Multiplanar and multiecho pulse sequences of the cervical spine, to include the craniocervical junction and cervicothoracic junction, were obtained without and with intravenous contrast. CONTRAST:  10mL GADAVIST GADOBUTROL 1 MMOL/ML IV SOLN COMPARISON:  None Available. FINDINGS: Alignment: Straightening of the normal cervical lordosis. Vertebrae: Maintain their height and alignment and are normal in signal intensity, excepting for mild discogenic reactive changes. Cord: Normal signal and morphology. No abnormal spinal cord or nerve root enhancement. Posterior Fossa, vertebral arteries, paraspinal tissues:  Unremarkable. Disc levels: C2-3: Mild bilateral facet hypertrophic changes. C3-4: Mild bilateral facet hypertrophy. C4-5: Broad-based bulging disc osteophyte complex indenting the ventral surface of the spinal cord and causing moderate central spinal canal stenosis. Mild-to-moderate right neural foraminal stenosis and mild left neural foraminal stenosis. Bilateral facet hypertrophic changes, more pronounced on the right. C5-6: Bulging disc osteophyte complex, slightly eccentric to the left, resulting in moderate central spinal canal stenosis and moderate left neural foraminal stenosis. No definite nerve root impingement. C6-7: Broad-based bulging disc osteophyte complex causing moderate to severe central spinal canal stenosis, more pronounced on the left. Severe bilateral neural foraminal stenosis. C7-T1: Broad-based bulging disc osteophyte complex, eccentric to the left, causing moderate left spinal canal and neural foraminal stenosis. There is also mild-to-moderate right neural foraminal stenosis. IMPRESSION: 1. Multilevel chronic degenerative disc disease with varying central spinal canal stenosis and neural foraminal stenosis, as detailed above. Electronically Signed   By: Maribeth Shivers M.D.   On: 03/11/2024 09:29   MR Brain W and Wo Contrast Result Date: 03/11/2024 CLINICAL DATA:  Right arm weakness.  Acute stroke suspected. EXAM: MRI HEAD WITHOUT AND WITH CONTRAST TECHNIQUE: Multiplanar, multiecho pulse sequences of the brain and surrounding structures were obtained without and with intravenous contrast. CONTRAST:  10mL GADAVIST GADOBUTROL 1 MMOL/ML IV SOLN COMPARISON:  None Available. FINDINGS: Brain: No acute infarction, hemorrhage, hydrocephalus, extra-axial collection or mass lesion. Vascular: Normal flow voids. Skull and upper cervical spine: Normal marrow signal. Sinuses/Orbits: Negative. Other: None. IMPRESSION: Normal. Electronically Signed   By: Maribeth Shivers M.D.   On: 03/11/2024 08:54     Procedures Procedures    Medications Ordered in ED Medications  potassium chloride  SA (KLOR-CON  M) CR tablet 40 mEq (has no administration in time range)  potassium chloride  10 mEq in 100 mL IVPB (has no administration in  time range)  dexamethasone (DECADRON) injection 10 mg (has no administration in time range)  gadobutrol (GADAVIST) 1 MMOL/ML injection 10 mL (10 mLs Intravenous Contrast Given 03/11/24 1610)    ED Course/ Medical Decision Making/ A&P                                 Medical Decision Making Amount and/or Complexity of Data Reviewed Labs: ordered. Radiology: ordered.  Risk Prescription drug management.   This patient presents to the ED for concern of right arm weakness, this involves an extensive number of treatment options, and is a complaint that carries with it a high risk of complications and morbidity.  The differential diagnosis includes cva, myelopathy, distal nerve radiculopathy, electrolyte abn, ms   Co morbidities that complicate the patient evaluation  arthritis, htn, hld, dm, chronic low back pain and carpal tunnel syndrome   Additional history obtained:  Additional history obtained from epic chart review  Lab Tests:  I Ordered, and personally interpreted labs.  The pertinent results include:  cbc nl, cmp nl other than k low at 2.7, mg nl, ck nl   Imaging Studies ordered:  I ordered imaging studies including mri brain/cervical spine  I independently visualized and interpreted imaging which showed  Mri brain: IMPRESSION:  Normal.  MRI cervical spine: 1. Multilevel chronic degenerative disc disease with varying central  spinal canal stenosis and neural foraminal stenosis, as detailed  above.   I agree with the radiologist interpretation   Medicines ordered and prescription drug management:  I ordered medication including decadron  for sx  Reevaluation of the patient after these medicines showed that the patient improved I have  reviewed the patients home medicines and have made adjustments as needed   Test Considered:  mri   Critical Interventions:  decadron  Problem List / ED Course:  R arm weakness:  likely due to spinal stenosis.  Pt will be d/c with steroid taper and is instructed to f/u with NS.  Return if worse.  Hypokalemia:  pt given iv and oral k   Reevaluation:  After the interventions noted above, I reevaluated the patient and found that they have :improved   Social Determinants of Health:  Lives at home   Dispostion:  After consideration of the diagnostic results and the patients response to treatment, I feel that the patent would benefit from discharge with outpatient f/u.          Final Clinical Impression(s) / ED Diagnoses Final diagnoses:  Hypokalemia  Spinal stenosis in cervical region    Rx / DC Orders ED Discharge Orders          Ordered    methylPREDNISolone  (MEDROL  DOSEPAK) 4 MG TBPK tablet  Daily        03/11/24 0942              Sueellen Emery, MD 03/11/24 [phone]

## 2024-03-12 ENCOUNTER — Other Ambulatory Visit: Payer: Self-pay | Admitting: Family Medicine

## 2024-03-29 ENCOUNTER — Other Ambulatory Visit: Payer: Self-pay | Admitting: Family Medicine

## 2024-04-01 ENCOUNTER — Other Ambulatory Visit: Payer: Self-pay | Admitting: Family Medicine

## 2024-04-02 ENCOUNTER — Encounter: Payer: Self-pay | Admitting: Family Medicine

## 2024-04-02 ENCOUNTER — Other Ambulatory Visit: Payer: Self-pay

## 2024-04-02 MED ORDER — MOUNJARO 7.5 MG/0.5ML ~~LOC~~ SOAJ
7.5000 mg | SUBCUTANEOUS | 3 refills | Status: DC
Start: 2024-04-02 — End: 2024-06-24

## 2024-04-03 ENCOUNTER — Telehealth: Payer: Self-pay

## 2024-04-03 NOTE — Telephone Encounter (Signed)
Noted  Updated chart

## 2024-04-03 NOTE — Telephone Encounter (Signed)
 Copied from CRM [id number]. Topic: Clinical - Medication Question >> Apr 03, 2024  9:21 AM Deaijah H wrote: Reason for CRM: *no question pharmacy update*  Patient has a new pharmacy for future prescription (ONLY for Mounjaro ) [facility] [address] [place] [address]  Phone: [phone] Fax: [phone]

## 2024-04-25 ENCOUNTER — Other Ambulatory Visit: Payer: Self-pay | Admitting: Family Medicine

## 2024-04-26 ENCOUNTER — Other Ambulatory Visit: Payer: Self-pay | Admitting: Family Medicine

## 2024-04-28 ENCOUNTER — Other Ambulatory Visit: Payer: Self-pay | Admitting: Family Medicine

## 2024-05-03 ENCOUNTER — Other Ambulatory Visit: Payer: Self-pay | Admitting: Family Medicine

## 2024-05-27 ENCOUNTER — Other Ambulatory Visit: Payer: Self-pay | Admitting: Family Medicine

## 2024-06-09 ENCOUNTER — Ambulatory Visit (HOSPITAL_COMMUNITY)
Admission: RE | Admit: 2024-06-09 | Discharge: 2024-06-09 | Disposition: A | Discharge status: Home / Self Care | Source: Ambulatory Visit | Attending: Family Medicine | Admitting: Family Medicine

## 2024-06-09 ENCOUNTER — Telehealth: Admitting: Physician Assistant

## 2024-06-09 DIAGNOSIS — J019 Acute sinusitis, unspecified: Secondary | ICD-10-CM

## 2024-06-09 DIAGNOSIS — B9689 Other specified bacterial agents as the cause of diseases classified elsewhere: Secondary | ICD-10-CM | POA: Diagnosis not present

## 2024-06-09 DIAGNOSIS — Z1231 Encounter for screening mammogram for malignant neoplasm of breast: Secondary | ICD-10-CM | POA: Diagnosis present

## 2024-06-09 MED ORDER — AMOXICILLIN-POT CLAVULANATE 875-125 MG PO TABS
1.0000 | ORAL_TABLET | Freq: Two times a day (BID) | ORAL | 0 refills | Status: DC
Start: 2024-06-09 — End: 2024-08-19

## 2024-06-09 NOTE — Progress Notes (Signed)

## 2024-06-10 ENCOUNTER — Other Ambulatory Visit: Payer: Self-pay | Admitting: Family Medicine

## 2024-06-16 ENCOUNTER — Other Ambulatory Visit: Payer: Self-pay | Admitting: Family Medicine

## 2024-06-23 ENCOUNTER — Other Ambulatory Visit: Payer: Self-pay | Admitting: Family Medicine

## 2024-06-23 ENCOUNTER — Telehealth: Payer: Self-pay

## 2024-06-23 NOTE — Telephone Encounter (Signed)
 Copied from CRM #[id number]. Topic: Clinical - Prescription Issue >> Jun 23, 2024  3:24 PM Harlene ORN wrote: Reason for CRM: [facility]  Calling to request a refill. Will be sending a refill request for [facility]  Phone: [phone] Fax: [phone]

## 2024-06-24 ENCOUNTER — Other Ambulatory Visit: Payer: Self-pay

## 2024-06-24 MED ORDER — MOUNJARO 7.5 MG/0.5ML ~~LOC~~ SOAJ: 7.5000 mg | SUBCUTANEOUS | 3 refills | Status: DC

## 2024-07-12 ENCOUNTER — Other Ambulatory Visit: Payer: Self-pay | Admitting: Family Medicine

## 2024-07-23 ENCOUNTER — Other Ambulatory Visit: Payer: Self-pay | Admitting: Family Medicine

## 2024-07-25 ENCOUNTER — Other Ambulatory Visit: Payer: Self-pay | Admitting: Family Medicine

## 2024-07-26 ENCOUNTER — Other Ambulatory Visit: Payer: Self-pay | Admitting: Family Medicine

## 2024-08-02 ENCOUNTER — Other Ambulatory Visit: Payer: Self-pay | Admitting: Family Medicine

## 2024-08-19 ENCOUNTER — Encounter: Payer: Self-pay | Admitting: Family Medicine

## 2024-08-19 ENCOUNTER — Ambulatory Visit (INDEPENDENT_AMBULATORY_CARE_PROVIDER_SITE_OTHER): Admitting: Family Medicine

## 2024-08-19 VITALS — BP 120/69 | HR 70 | Resp 18 | Ht 63.0 in | Wt 197.0 lb

## 2024-08-19 DIAGNOSIS — E1159 Type 2 diabetes mellitus with other circulatory complications: Secondary | ICD-10-CM | POA: Diagnosis not present

## 2024-08-19 DIAGNOSIS — E1169 Type 2 diabetes mellitus with other specified complication: Secondary | ICD-10-CM | POA: Diagnosis not present

## 2024-08-19 DIAGNOSIS — Z23 Encounter for immunization: Secondary | ICD-10-CM

## 2024-08-19 DIAGNOSIS — E785 Hyperlipidemia, unspecified: Secondary | ICD-10-CM | POA: Diagnosis not present

## 2024-08-19 DIAGNOSIS — Z0001 Encounter for general adult medical examination with abnormal findings: Secondary | ICD-10-CM | POA: Diagnosis not present

## 2024-08-19 DIAGNOSIS — I10 Essential (primary) hypertension: Secondary | ICD-10-CM | POA: Diagnosis not present

## 2024-08-19 MED ORDER — FLUOXETINE HCL 20 MG PO CAPS: 20.0000 mg | ORAL_CAPSULE | Freq: Every day | ORAL | 3 refills | Status: AC

## 2024-08-19 MED ORDER — TIRZEPATIDE 10 MG/0.5ML ~~LOC~~ SOAJ: 10.0000 mg | SUBCUTANEOUS | 1 refills | Status: DC

## 2024-08-19 MED ORDER — FLUTICASONE PROPIONATE 50 MCG/ACT NA SUSP: 2.0000 | Freq: Every day | NASAL | 6 refills | Status: AC

## 2024-08-19 NOTE — Progress Notes (Unsigned)
    Brittany Maxwell     MRN: [id number]      DOB: 1966-06-07  Chief Complaint  Patient presents with   Annual Exam    Cpe     HPI: Patient is in for annual physical exam. No other health concerns are expressed or addressed at the visit.  Immunization is reviewed and updated  PE: BP 120/69   Pulse 70   Resp 18   Ht 5' 3 (1.6 m)   Wt 197 lb (89.4 kg)   SpO2 96%   BMI 34.90 kg/m   Pleasant  female, alert and oriented x 3, in no cardio-pulmonary distress. Afebrile. HEENT No facial trauma or asymetry. Sinuses non tender.  Extra occullar muscles intact.. External ears normal, . Neck: supple, no adenopathy,JVD or thyromegaly.No bruits.  Chest: Clear to ascultation bilaterally.No crackles or wheezes. Non tender to palpation   Cardiovascular system; Heart sounds normal,  S1 and  S2 ,no S3.  No murmur, or thrill. Apical beat not displaced Peripheral pulses normal.  Abdomen: Soft, non tender .    Musculoskeletal exam: Decreased  ROM of spine, adequate in hips , shoulders and knees. No deformity ,swelling or crepitus noted. No muscle wasting or atrophy.   Neurologic: Cranial nerves 2 to 12 intact. Power, tone ,sensation  normal throughout. No disturbance in gait. No tremor.  Skin: Intact, no ulceration, erythema , scaling or rash noted. Pigmentation normal throughout  Psych; Normal mood and affect. Judgement and concentration normal   Assessment & Plan:  No problem-specific Assessment & Plan notes found for this encounter.

## 2024-08-19 NOTE — Assessment & Plan Note (Signed)

## 2024-08-19 NOTE — Assessment & Plan Note (Signed)
 Diabetes associated with hypertension, hyperlipidemia, obesity, and arthritis  Brittany Maxwell is reminded of the importance of commitment to daily physical activity for 30 minutes or more, as able and the need to limit carbohydrate intake to 30 to 60 grams per meal to help with blood sugar control.   The need to take medication as prescribed, test blood sugar as directed, and to call between visits if there is a concern that blood sugar is uncontrolled is also discussed.   Brittany Maxwell is reminded of the importance of daily foot exam, annual eye examination, and good blood sugar, blood pressure and cholesterol control.     Latest Ref Rng & Units 03/11/2024    7:48 AM 01/01/2024    8:58 AM 08/15/2023    8:52 AM 04/17/2023    9:12 AM 12/12/2022    9:17 AM  Diabetic Labs  HbA1c 4.8 - 5.6 %  6.6  6.4  6.4  6.4   Micro/Creat Ratio 0 - 29 mg/g creat  12    11   Chol 100 - 199 mg/dL  863   855  796   HDL >60 mg/dL  57   54  52   Calc LDL 0 - 99 mg/dL  60   67  867   Triglycerides 0 - 149 mg/dL  898   870  893   Creatinine 0.44 - 1.00 mg/dL 8.95  8.78  8.69  8.81  1.36       08/19/2024    8:25 AM 08/19/2024    8:00 AM 03/11/2024   11:15 AM 03/11/2024   11:00 AM 03/11/2024   10:45 AM 03/11/2024   10:30 AM 03/11/2024    7:45 AM  BP/Weight  Systolic BP  120 871 126 106 151 141  Diastolic BP  69 78 76 72 98 98  Wt. (Lbs) 197 199.08       BMI 34.9 kg/m2 35.27 kg/m2           Latest Ref Rng & Units 08/19/2024    8:00 AM 09/10/2023   12:00 AM  Foot/eye exam completion dates  Eye Exam No Retinopathy  No Retinopathy      Foot Form Completion  Done      This result is from an external source.     Increase dose mounjaro 

## 2024-08-19 NOTE — Patient Instructions (Addendum)
 Follow-up in 5 months.  Influenza vaccine today.  Hepatitis B #1 vaccine today.  Nurse visit in 2 months for hepatitis B #2 vaccine.  Excellent foot exam today.  Please get your COVID-vaccine at the pharmacy in the next 2 weeks.  New higher dose of Mounjaro  to be started this week at 10 mg weekly.  Please do send me a message to let me know if you are able to get this dose or not so I can have accurate recording in your record.  Labs today lipid panel CMP and eGFR HbA1c.  It is important that you exercise regularly at least 30 minutes 5 times a week. If you develop chest pain, have severe difficulty breathing, or feel very tired, stop exercising immediately and seek medical attention  Think about what you will eat, plan ahead. Choose  clean, green, fresh or frozen over canned, processed or packaged foods which are more sugary, salty and fatty. 70 to 75% of food eaten should be vegetables and fruit. Three meals at set times with snacks allowed between meals, but they must be fruit or vegetables. Aim to eat over a 12 hour period , example 7 am to 7 pm, and STOP after  your last meal of the day. Drink water,generally about 64 ounces per day, no other drink is as healthy. Fruit juice is best enjoyed in a healthy way, by EATING the fruit.   Thanks for choosing [facility], we consider it a privelige to serve you.

## 2024-08-20 ENCOUNTER — Ambulatory Visit: Payer: Self-pay | Admitting: Family Medicine

## 2024-08-20 DIAGNOSIS — Z23 Encounter for immunization: Secondary | ICD-10-CM | POA: Insufficient documentation

## 2024-08-20 LAB — LIPID PANEL
Chol/HDL Ratio: 2.9 ratio (ref 0.0–4.4)
Cholesterol, Total: 144 mg/dL (ref 100–199)
HDL: 50 mg/dL (ref >39)
LDL Chol Calc (NIH): 73 mg/dL (ref 0–99)
Triglycerides: 116 mg/dL (ref 0–149)
VLDL Cholesterol Cal: 21 mg/dL (ref 5–40)

## 2024-08-20 LAB — CMP14+EGFR
ALT: 15 IU/L (ref 0–32)
AST: 18 IU/L (ref 0–40)
Albumin: 4.3 g/dL (ref 3.8–4.9)
Alkaline Phosphatase: 91 IU/L (ref 49–135)
BUN/Creatinine Ratio: 16 (ref 9–23)
BUN: 19 mg/dL (ref 6–24)
Bilirubin Total: 0.6 mg/dL (ref 0.0–1.2)
CO2: 25 mmol/L (ref 20–29)
Calcium: 9.7 mg/dL (ref 8.7–10.2)
Chloride: 99 mmol/L (ref 96–106)
Creatinine, Ser: 1.19 mg/dL — ABNORMAL HIGH (ref 0.57–1.00)
Globulin, Total: 3.3 g/dL (ref 1.5–4.5)
Glucose: 102 mg/dL — ABNORMAL HIGH (ref 70–99)
Potassium: 3.2 mmol/L — ABNORMAL LOW (ref 3.5–5.2)
Sodium: 141 mmol/L (ref 134–144)
Total Protein: 7.6 g/dL (ref 6.0–8.5)
eGFR: 53 mL/min/1.73 — ABNORMAL LOW (ref >59)

## 2024-08-20 LAB — HEMOGLOBIN A1C
Est. average glucose Bld gHb Est-mCnc: 134 mg/dL
Hgb A1c MFr Bld: 6.3 % — ABNORMAL HIGH (ref 4.8–5.6)

## 2024-08-20 MED ORDER — POTASSIUM CHLORIDE CRYS ER 20 MEQ PO TBCR
20.0000 meq | EXTENDED_RELEASE_TABLET | Freq: Two times a day (BID) | ORAL | 3 refills | Status: AC
Start: 2024-08-20 — End: ?

## 2024-08-20 NOTE — Assessment & Plan Note (Signed)
 After obtaining informed consent, the influenza and hep B#1  vaccines are   administered , with no adverse effect noted at the time of administration.

## 2024-08-22 ENCOUNTER — Telehealth: Payer: Self-pay

## 2024-08-22 ENCOUNTER — Other Ambulatory Visit: Payer: Self-pay

## 2024-08-22 MED ORDER — TIRZEPATIDE 10 MG/0.5ML ~~LOC~~ SOAJ: 10.0000 mg | SUBCUTANEOUS | 1 refills | Status: DC

## 2024-08-22 NOTE — Telephone Encounter (Signed)
 Pt called to change mounjaro  to another pharmacy. Sent to [facility]

## 2024-08-22 NOTE — Telephone Encounter (Signed)
 Copied from CRM #[id number]. Topic: Clinical - Prescription Issue >> Aug 22, 2024 10:43 AM Tonda B wrote: Reason for CRM: pt is calling has questions about her rx  tirzepatide  (MOUNJARO ) 10 MG/0.5ML Pen please call pt back [phone] (M) [phone] (H)

## 2024-08-26 ENCOUNTER — Other Ambulatory Visit: Payer: Self-pay

## 2024-08-26 ENCOUNTER — Telehealth: Payer: Self-pay | Admitting: Family Medicine

## 2024-08-26 MED ORDER — TIRZEPATIDE 10 MG/0.5ML ~~LOC~~ SOAJ: 10.0000 mg | SUBCUTANEOUS | 1 refills | Status: AC

## 2024-08-26 NOTE — Telephone Encounter (Signed)
 Copied from CRM [id number]. Topic: Clinical - Prescription Issue >> Aug 26, 2024  9:26 AM Joesph NOVAK wrote: Reason for CRM: Brittany from [facility] is calling to inform patients provider they have not received her prescription for Mounjaro . Requesting for it to be sent again. I advised her it was sent on 11/21.   tirzepatide  (MOUNJARO ) 10 MG/0.5ML Pen   [facility] - [place], [place] [address] [place] [address] Phone: [phone] Fax: [phone] Hours: Not open 24 hours

## 2024-08-26 NOTE — Telephone Encounter (Signed)
 Resent

## 2024-08-29 ENCOUNTER — Other Ambulatory Visit: Payer: Self-pay | Admitting: Family Medicine

## 2024-09-10 ENCOUNTER — Other Ambulatory Visit: Payer: Self-pay | Admitting: Family Medicine

## 2024-09-23 ENCOUNTER — Telehealth: Admitting: Physician Assistant

## 2024-09-23 DIAGNOSIS — B9789 Other viral agents as the cause of diseases classified elsewhere: Secondary | ICD-10-CM | POA: Diagnosis not present

## 2024-09-23 DIAGNOSIS — J019 Acute sinusitis, unspecified: Secondary | ICD-10-CM | POA: Diagnosis not present

## 2024-09-24 MED ORDER — AZELASTINE HCL 0.1 % NA SOLN: 1.0000 | Freq: Two times a day (BID) | NASAL | 0 refills | Status: AC

## 2024-09-24 MED ORDER — PREDNISONE 20 MG PO TABS: 40.0000 mg | ORAL_TABLET | Freq: Every day | ORAL | 0 refills | Status: AC

## 2024-09-24 NOTE — Addendum Note (Signed)
 Addended by: VIVIENNE DELON HERO on: 09/24/2024 09:08 AM   Modules accepted: Orders

## 2024-09-24 NOTE — Progress Notes (Signed)
 We are sorry that you are not feeling well.  Here is how we plan to help!  Based on what you have shared with me it looks like you have sinusitis.  Sinusitis is inflammation and infection in the sinus cavities of the head.  Based on your presentation I believe you most likely have Acute Viral Sinusitis.This is an infection most likely caused by a virus. There is not specific treatment for viral sinusitis other than to help you with the symptoms until the infection runs its course.  You may use an oral decongestant such as Mucinex D or if you have glaucoma or high blood pressure use plain Mucinex. Saline nasal spray help and can safely be used as often as needed for congestion, I have prescribed: Azelastine  nasal spray 2 sprays in each nostril twice a day, can be used together with Nasacort.  Some authorities believe that zinc sprays or the use of Echinacea may shorten the course of your symptoms.  Sinus infections are not as easily transmitted as other respiratory infection, however we still recommend that you avoid close contact with loved ones, especially the very young and elderly.  Remember to wash your hands thoroughly throughout the day as this is the number one way to prevent the spread of infection!  Home Care: Only take medications as instructed by your medical team. Do not take these medications with alcohol. A steam or ultrasonic humidifier can help congestion.  You can place a towel over your head and breathe in the steam from hot water coming from a faucet. Avoid close contacts especially the very young and the elderly. Cover your mouth when you cough or sneeze. Always remember to wash your hands.  Get Help Right Away If: You develop worsening fever or sinus pain. You develop a severe head ache or visual changes. Your symptoms persist after you have completed your treatment plan.  Make sure you Understand these instructions. Will watch your condition. Will get help right away if  you are not doing well or get worse.  Your e-visit answers were reviewed by a board certified advanced clinical practitioner to complete your personal care plan.  Depending on the condition, your plan could have included both over the counter or prescription medications.  If there is a problem please reply  once you have received a response from your provider.  Your safety is important to us .  If you have drug allergies check your prescription carefully.    You can use MyChart to ask questions about todays visit, request a non-urgent call back, or ask for a work or school excuse for 24 hours related to this e-Visit. If it has been greater than 24 hours you will need to follow up with your provider, or enter a new e-Visit to address those concerns.  You will get an e-mail in the next two days asking about your experience.  I hope that your e-visit has been valuable and will speed your recovery. Thank you for using e-visits.  I have spent 5 minutes in review of e-visit questionnaire, review and updating patient chart, medical decision making and response to patient.   Brittany Maxwell CHRISTELLA Dickinson, PA-C

## 2024-09-27 ENCOUNTER — Other Ambulatory Visit: Payer: Self-pay | Admitting: Family Medicine

## 2024-10-19 ENCOUNTER — Other Ambulatory Visit: Payer: Self-pay | Admitting: Family Medicine

## 2024-10-20 ENCOUNTER — Ambulatory Visit

## 2024-10-21 ENCOUNTER — Ambulatory Visit (INDEPENDENT_AMBULATORY_CARE_PROVIDER_SITE_OTHER)

## 2024-10-21 ENCOUNTER — Ambulatory Visit: Admitting: Family Medicine

## 2024-10-21 DIAGNOSIS — Z23 Encounter for immunization: Secondary | ICD-10-CM

## 2024-10-21 NOTE — Progress Notes (Signed)
 Patient is in office today for a nurse visit for Immunization. Patient Injection was given in the  Left deltoid. Patient tolerated injection well.

## 2024-10-25 ENCOUNTER — Other Ambulatory Visit: Payer: Self-pay | Admitting: Family Medicine

## 2024-10-27 ENCOUNTER — Other Ambulatory Visit: Payer: Self-pay | Admitting: Family Medicine

## 2024-10-31 ENCOUNTER — Other Ambulatory Visit: Payer: Self-pay | Admitting: Family Medicine

## 2025-01-19 ENCOUNTER — Ambulatory Visit
